# Patient Record
Sex: Female | Born: 1975
Health system: Southern US, Community
[De-identification: ages and names within clinical notes are randomized; demographics above are authoritative.]

## PROBLEM LIST (undated history)

## (undated) ENCOUNTER — Inpatient Hospital Stay (HOSPITAL_COMMUNITY): Payer: PRIVATE HEALTH INSURANCE

## (undated) ENCOUNTER — Emergency Department (HOSPITAL_COMMUNITY): Admission: EM | Discharge status: Against Medical Advice | Payer: Self-pay

## (undated) DIAGNOSIS — R87629 Unspecified abnormal cytological findings in specimens from vagina: Secondary | ICD-10-CM

## (undated) DIAGNOSIS — Z8619 Personal history of other infectious and parasitic diseases: Secondary | ICD-10-CM

## (undated) DIAGNOSIS — I10 Essential (primary) hypertension: Secondary | ICD-10-CM

## (undated) HISTORY — DX: Personal history of other infectious and parasitic diseases: Z86.19

## (undated) HISTORY — PX: BREAST BIOPSY: SHX20

## (undated) HISTORY — PX: FINGER SURGERY: SHX640

## (undated) HISTORY — PX: NO PAST SURGERIES: SHX2092

---

## 2006-01-26 ENCOUNTER — Emergency Department (HOSPITAL_COMMUNITY): Admission: EM | Admit: 2006-01-26 | Discharge: 2006-01-26 | Payer: Self-pay | Admitting: Emergency Medicine

## 2006-02-25 ENCOUNTER — Emergency Department (HOSPITAL_COMMUNITY): Admission: EM | Admit: 2006-02-25 | Discharge: 2006-02-26 | Payer: Self-pay | Admitting: Emergency Medicine

## 2006-12-29 ENCOUNTER — Emergency Department (HOSPITAL_COMMUNITY): Admission: EM | Admit: 2006-12-29 | Discharge: 2006-12-29 | Payer: Self-pay | Admitting: Emergency Medicine

## 2007-02-24 ENCOUNTER — Emergency Department (HOSPITAL_COMMUNITY): Admission: EM | Admit: 2007-02-24 | Discharge: 2007-02-24 | Payer: Self-pay | Admitting: Emergency Medicine

## 2007-04-26 ENCOUNTER — Inpatient Hospital Stay (HOSPITAL_COMMUNITY): Admission: AD | Admit: 2007-04-26 | Discharge: 2007-04-27 | Payer: Self-pay | Admitting: Obstetrics & Gynecology

## 2007-08-26 ENCOUNTER — Emergency Department (HOSPITAL_COMMUNITY): Admission: EM | Admit: 2007-08-26 | Discharge: 2007-08-26 | Payer: Self-pay | Admitting: Emergency Medicine

## 2007-11-11 ENCOUNTER — Emergency Department (HOSPITAL_COMMUNITY): Admission: EM | Admit: 2007-11-11 | Discharge: 2007-11-11 | Payer: Self-pay | Admitting: Emergency Medicine

## 2008-02-17 ENCOUNTER — Emergency Department (HOSPITAL_COMMUNITY): Admission: EM | Admit: 2008-02-17 | Discharge: 2008-02-17 | Payer: Self-pay | Admitting: Emergency Medicine

## 2008-03-10 ENCOUNTER — Emergency Department (HOSPITAL_COMMUNITY): Admission: EM | Admit: 2008-03-10 | Discharge: 2008-03-10 | Payer: Self-pay | Admitting: Emergency Medicine

## 2008-04-27 ENCOUNTER — Emergency Department (HOSPITAL_COMMUNITY): Admission: EM | Admit: 2008-04-27 | Discharge: 2008-04-28 | Payer: Self-pay | Admitting: Emergency Medicine

## 2008-08-28 ENCOUNTER — Emergency Department (HOSPITAL_COMMUNITY): Admission: EM | Admit: 2008-08-28 | Discharge: 2008-08-28 | Payer: Self-pay | Admitting: Emergency Medicine

## 2008-08-30 ENCOUNTER — Emergency Department (HOSPITAL_COMMUNITY): Admission: EM | Admit: 2008-08-30 | Discharge: 2008-08-30 | Payer: Self-pay | Admitting: Emergency Medicine

## 2008-11-29 ENCOUNTER — Emergency Department (HOSPITAL_COMMUNITY): Admission: EM | Admit: 2008-11-29 | Discharge: 2008-11-29 | Payer: Self-pay | Admitting: Emergency Medicine

## 2008-12-03 ENCOUNTER — Emergency Department (HOSPITAL_COMMUNITY): Admission: EM | Admit: 2008-12-03 | Discharge: 2008-12-04 | Payer: Self-pay | Admitting: Emergency Medicine

## 2009-01-16 ENCOUNTER — Other Ambulatory Visit: Admission: RE | Admit: 2009-01-16 | Discharge: 2009-01-16 | Payer: Self-pay | Admitting: Obstetrics and Gynecology

## 2009-04-20 ENCOUNTER — Emergency Department (HOSPITAL_COMMUNITY): Admission: EM | Admit: 2009-04-20 | Discharge: 2009-04-20 | Payer: Self-pay | Admitting: Emergency Medicine

## 2009-06-27 ENCOUNTER — Emergency Department (HOSPITAL_COMMUNITY): Admission: EM | Admit: 2009-06-27 | Discharge: 2009-06-27 | Payer: Self-pay | Admitting: Emergency Medicine

## 2009-09-07 ENCOUNTER — Emergency Department (HOSPITAL_COMMUNITY): Admission: EM | Admit: 2009-09-07 | Discharge: 2009-09-08 | Payer: Self-pay | Admitting: Emergency Medicine

## 2010-03-21 LAB — URINALYSIS, ROUTINE W REFLEX MICROSCOPIC
Bilirubin Urine: NEGATIVE
Glucose, UA: NEGATIVE mg/dL
Hgb urine dipstick: NEGATIVE
Ketones, ur: NEGATIVE mg/dL
Nitrite: NEGATIVE
Protein, ur: NEGATIVE mg/dL
Specific Gravity, Urine: 1.025 (ref 1.005–1.030)
Urobilinogen, UA: 1 mg/dL (ref 0.0–1.0)
pH: 6 (ref 5.0–8.0)

## 2010-03-21 LAB — POCT I-STAT, CHEM 8
BUN: 9 mg/dL (ref 6–23)
Calcium, Ion: 1.17 mmol/L (ref 1.12–1.32)
Chloride: 105 mEq/L (ref 96–112)
Creatinine, Ser: 0.9 mg/dL (ref 0.4–1.2)
Glucose, Bld: 79 mg/dL (ref 70–99)
HCT: 38 % (ref 36.0–46.0)
Hemoglobin: 12.9 g/dL (ref 12.0–15.0)
Potassium: 3.9 mEq/L (ref 3.5–5.1)
Sodium: 140 mEq/L (ref 135–145)
TCO2: 27 mmol/L (ref 0–100)

## 2010-03-21 LAB — POCT PREGNANCY, URINE: Preg Test, Ur: NEGATIVE

## 2010-03-26 ENCOUNTER — Inpatient Hospital Stay (HOSPITAL_COMMUNITY): Payer: Self-pay

## 2010-03-26 ENCOUNTER — Inpatient Hospital Stay (HOSPITAL_COMMUNITY)
Admission: AD | Admit: 2010-03-26 | Discharge: 2010-03-26 | Disposition: A | Discharge status: Home / Self Care | Payer: Self-pay | Source: Ambulatory Visit | Attending: Obstetrics & Gynecology | Admitting: Obstetrics & Gynecology

## 2010-03-26 DIAGNOSIS — N760 Acute vaginitis: Secondary | ICD-10-CM

## 2010-03-26 DIAGNOSIS — N83209 Unspecified ovarian cyst, unspecified side: Secondary | ICD-10-CM | POA: Insufficient documentation

## 2010-03-26 DIAGNOSIS — R109 Unspecified abdominal pain: Secondary | ICD-10-CM

## 2010-03-26 DIAGNOSIS — B9689 Other specified bacterial agents as the cause of diseases classified elsewhere: Secondary | ICD-10-CM | POA: Insufficient documentation

## 2010-03-26 DIAGNOSIS — A499 Bacterial infection, unspecified: Secondary | ICD-10-CM

## 2010-03-26 LAB — WET PREP, GENITAL: Yeast Wet Prep HPF POC: NONE SEEN

## 2010-03-26 LAB — CBC
HCT: 34.8 % — ABNORMAL LOW (ref 36.0–46.0)
Hemoglobin: 11.2 g/dL — ABNORMAL LOW (ref 12.0–15.0)
MCH: 26 pg (ref 26.0–34.0)
RBC: 4.31 MIL/uL (ref 3.87–5.11)
RDW: 14.4 % (ref 11.5–15.5)
WBC: 9.3 10*3/uL (ref 4.0–10.5)

## 2010-03-26 LAB — DIFFERENTIAL
Lymphs Abs: 3.5 10*3/uL (ref 0.7–4.0)
Monocytes Relative: 7 % (ref 3–12)
Neutrophils Relative %: 55 % (ref 43–77)

## 2010-03-26 LAB — STREP A DNA PROBE: Group A Strep Probe: NEGATIVE

## 2010-03-26 LAB — URINALYSIS, ROUTINE W REFLEX MICROSCOPIC
Hgb urine dipstick: NEGATIVE
Ketones, ur: NEGATIVE mg/dL
Protein, ur: NEGATIVE mg/dL
Specific Gravity, Urine: 1.025 (ref 1.005–1.030)
Urobilinogen, UA: 0.2 mg/dL (ref 0.0–1.0)
pH: 6 (ref 5.0–8.0)

## 2010-03-26 LAB — RAPID STREP SCREEN (MED CTR MEBANE ONLY): Streptococcus, Group A Screen (Direct): NEGATIVE

## 2010-03-27 LAB — GC/CHLAMYDIA PROBE AMP, GENITAL: GC Probe Amp, Genital: NEGATIVE

## 2010-06-09 ENCOUNTER — Emergency Department (HOSPITAL_COMMUNITY)
Admission: EM | Admit: 2010-06-09 | Discharge: 2010-06-10 | Disposition: A | Discharge status: Other Acute Inpatient Hospital | Payer: Self-pay | Attending: Emergency Medicine | Admitting: Emergency Medicine

## 2010-06-09 DIAGNOSIS — IMO0002 Reserved for concepts with insufficient information to code with codable children: Secondary | ICD-10-CM | POA: Insufficient documentation

## 2010-06-09 DIAGNOSIS — F3289 Other specified depressive episodes: Secondary | ICD-10-CM | POA: Insufficient documentation

## 2010-06-09 DIAGNOSIS — X789XXA Intentional self-harm by unspecified sharp object, initial encounter: Secondary | ICD-10-CM | POA: Insufficient documentation

## 2010-06-09 DIAGNOSIS — F1010 Alcohol abuse, uncomplicated: Secondary | ICD-10-CM | POA: Insufficient documentation

## 2010-06-09 DIAGNOSIS — F329 Major depressive disorder, single episode, unspecified: Secondary | ICD-10-CM | POA: Insufficient documentation

## 2010-06-09 LAB — DIFFERENTIAL
Basophils Relative: 0 % (ref 0–1)
Eosinophils Relative: 0 % (ref 0–5)
Lymphocytes Relative: 40 % (ref 12–46)
Monocytes Absolute: 0.3 10*3/uL (ref 0.1–1.0)

## 2010-06-09 LAB — HEPATIC FUNCTION PANEL
ALT: 14 U/L (ref 0–35)
AST: 20 U/L (ref 0–37)
Albumin: 3.7 g/dL (ref 3.5–5.2)
Alkaline Phosphatase: 76 U/L (ref 39–117)
Bilirubin, Direct: <0.1 mg/dL (ref 0.0–0.3)
Total Bilirubin: 0.1 mg/dL — ABNORMAL LOW (ref 0.3–1.2)
Total Protein: 7.9 g/dL (ref 6.0–8.3)

## 2010-06-09 LAB — ETHANOL: Alcohol, Ethyl (B): 178 mg/dL — ABNORMAL HIGH (ref 0–10)

## 2010-06-09 LAB — RAPID URINE DRUG SCREEN, HOSP PERFORMED
Cocaine: NOT DETECTED
Opiates: NOT DETECTED
Tetrahydrocannabinol: NOT DETECTED

## 2010-06-09 LAB — PREGNANCY, URINE: Preg Test, Ur: NEGATIVE

## 2010-06-09 LAB — BASIC METABOLIC PANEL
CO2: 24 mEq/L (ref 19–32)
Calcium: 8.4 mg/dL (ref 8.4–10.5)
Creatinine, Ser: 1.11 mg/dL (ref 0.4–1.2)
GFR calc Af Amer: >60 mL/min (ref >60)
GFR calc non Af Amer: 56 mL/min — ABNORMAL LOW (ref >60)
Sodium: 141 mEq/L (ref 135–145)

## 2010-06-09 LAB — CBC
HCT: 36 % (ref 36.0–46.0)
MCV: 78.1 fL (ref 78.0–100.0)
RBC: 4.61 MIL/uL (ref 3.87–5.11)

## 2010-06-10 ENCOUNTER — Inpatient Hospital Stay (HOSPITAL_COMMUNITY)
Admission: AD | Admit: 2010-06-10 | Discharge: 2010-06-11 | DRG: 885 | Disposition: A | Discharge status: Home / Self Care | Payer: PRIVATE HEALTH INSURANCE | Source: Ambulatory Visit | Attending: Psychiatry | Admitting: Psychiatry

## 2010-06-10 DIAGNOSIS — F39 Unspecified mood [affective] disorder: Principal | ICD-10-CM

## 2010-06-10 DIAGNOSIS — J45909 Unspecified asthma, uncomplicated: Secondary | ICD-10-CM

## 2010-06-10 DIAGNOSIS — R45851 Suicidal ideations: Secondary | ICD-10-CM

## 2010-06-10 DIAGNOSIS — F1910 Other psychoactive substance abuse, uncomplicated: Secondary | ICD-10-CM

## 2010-06-10 DIAGNOSIS — I10 Essential (primary) hypertension: Secondary | ICD-10-CM

## 2010-06-11 DIAGNOSIS — F39 Unspecified mood [affective] disorder: Secondary | ICD-10-CM

## 2010-07-18 NOTE — Assessment & Plan Note (Signed)
  Sherri Reid, Sherri Reid             ACCOUNT NO.:  [id number]  MEDICAL RECORD NO.:  [id number]  LOCATION:  0303                          FACILITY:  [facility]  PHYSICIAN:  Eulogio Ditch, MD DATE OF BIRTH:  Dec 31, 1975  DATE OF ADMISSION:  06/10/2010 DATE OF DISCHARGE:  06/11/2010                      PSYCHIATRIC ADMISSION ASSESSMENT   IDENTIFYING INFORMATION:  This is a 35 year old female.  This is an involuntary admission.  HISTORY OF PRESENT ILLNESS:  Sherri Reid was presented in our emergency room and placed under involuntary commitment after attempting to harm herself by cutting her wrist and neck.  She had also taken some ibuprofen, Percocet and Flexeril in amounts that are unclear.  MEDICAL EVALUATION:  She was medically evaluated in the emergency room and found to be stable and transferred to [facility] for further treatment.  COURSE OF HOSPITALIZATION:  She was admitted to the adult [facility] unit and no medications were initially started.  She gave permission for [place] to speak with her mother, who said that she had probably gotten anxious and did not understand why the patient had not phoned her.  She was evaluated by Dr. Eulogio Ditch, who found her to be stable and full contact with reality with no further concerns and she denied suicidal thoughts.  It was felt that her suicidal risk was minimal.  She was having no acute depressive symptoms and a family had no concerns about her safety.  WORKING ADMITTING DIAGNOSIS:  AXIS I:  Mood disorder NOS. AXIS II:  Deferred. AXIS III:  No diagnosis. AXIS IV:  Deferred. AXIS V:  Current 58, past year not known.  PLAN:  The plan is to discharge her today.  Please see the discharge summary.     Margaret A. Lorin Picket, N.P.   ______________________________ Eulogio Ditch, MD    MAS/MEDQ  D:  07/17/2010  T:  07/17/2010  Job:  [id number]  Electronically Signed by Kari Baars N.P. on 07/17/2010 11:41:24 AM Electronically Signed by  Eulogio Ditch  on 07/18/2010 03:31:23 PM

## 2010-07-18 NOTE — Discharge Summary (Signed)
  NAMESHAWNIKA, PEPIN             ACCOUNT NO.:  [id number]  MEDICAL RECORD NO.:  [id number]  LOCATION:  0303                          FACILITY:  [facility]  PHYSICIAN:  Eulogio Ditch, MD DATE OF BIRTH:  [birth year]  DATE OF ADMISSION:  06/10/2010 DATE OF DISCHARGE:  06/11/2010                              DISCHARGE SUMMARY   IDENTIFYING INFORMATION:  A 35 year old female.  This was an involuntary admission.  HISTORY OF PRESENT ILLNESS:  This is a brief admission for [facility] who had been placed under an involuntary petition in our emergency room after making some cuts to her arms and neck.  There was some concern about her safety.  On our unit, she was found to be in full contact with reality with no dangerous thoughts.  She clearly and convincingly denied any suicidal ideas.  Her family was contacted and had no concerns about her coming home and did not understand why she had not called them at the time if she had gotten anxious.  She agreed to outpatient followup.  DISCHARGE DIAGNOSIS:  Mood disorder, not otherwise specified.  DISCHARGE CONDITION:  Stable.  DISCHARGE MEDICATIONS:  None.  DISCHARGE INSTRUCTIONS:  Followup with [facility] on June 13, 2010 at 9:00 a.m.     Margaret A. Lorin Picket, N.P.   ______________________________ Eulogio Ditch, MD    MAS/MEDQ  D:  07/17/2010  T:  07/17/2010  Job:  [id number]  Electronically Signed by Kari Baars N.P. on 07/17/2010 11:41:29 AM Electronically Signed by Eulogio Ditch  on 07/18/2010 03:31:26 PM

## 2010-07-24 ENCOUNTER — Emergency Department (HOSPITAL_COMMUNITY)
Admission: EM | Admit: 2010-07-24 | Discharge: 2010-07-24 | Disposition: A | Discharge status: Home / Self Care | Payer: Self-pay | Attending: Emergency Medicine | Admitting: Emergency Medicine

## 2010-07-24 DIAGNOSIS — J45909 Unspecified asthma, uncomplicated: Secondary | ICD-10-CM | POA: Insufficient documentation

## 2010-07-24 DIAGNOSIS — I10 Essential (primary) hypertension: Secondary | ICD-10-CM | POA: Insufficient documentation

## 2010-07-24 DIAGNOSIS — T7840XA Allergy, unspecified, initial encounter: Secondary | ICD-10-CM | POA: Insufficient documentation

## 2010-07-24 DIAGNOSIS — L298 Other pruritus: Secondary | ICD-10-CM | POA: Insufficient documentation

## 2010-07-24 DIAGNOSIS — Z79899 Other long term (current) drug therapy: Secondary | ICD-10-CM | POA: Insufficient documentation

## 2010-07-24 DIAGNOSIS — L2989 Other pruritus: Secondary | ICD-10-CM | POA: Insufficient documentation

## 2010-07-24 DIAGNOSIS — L509 Urticaria, unspecified: Secondary | ICD-10-CM | POA: Insufficient documentation

## 2010-07-24 DIAGNOSIS — R21 Rash and other nonspecific skin eruption: Secondary | ICD-10-CM | POA: Insufficient documentation

## 2010-10-01 LAB — POCT PREGNANCY, URINE: Preg Test, Ur: NEGATIVE

## 2010-10-01 LAB — GC/CHLAMYDIA PROBE AMP, GENITAL
Chlamydia, DNA Probe: NEGATIVE
GC Probe Amp, Genital: NEGATIVE

## 2010-10-01 LAB — URINALYSIS, ROUTINE W REFLEX MICROSCOPIC
Glucose, UA: NEGATIVE
Ketones, ur: NEGATIVE
Nitrite: NEGATIVE
Protein, ur: NEGATIVE
pH: 6.5

## 2010-10-01 LAB — WET PREP, GENITAL: Clue Cells Wet Prep HPF POC: NONE SEEN

## 2010-10-11 LAB — RPR TITER: RPR Titer: 1:2 {titer} — AB

## 2010-10-11 LAB — URINALYSIS, ROUTINE W REFLEX MICROSCOPIC
Protein, ur: NEGATIVE
Specific Gravity, Urine: 1.023
Urobilinogen, UA: 0.2

## 2010-10-11 LAB — WET PREP, GENITAL
Trich, Wet Prep: NONE SEEN
Yeast Wet Prep HPF POC: NONE SEEN

## 2010-10-11 LAB — T.PALLIDUM AB, IGG: T pallidum Antibodies (TP-PA): >8 — ABNORMAL HIGH

## 2011-02-20 ENCOUNTER — Inpatient Hospital Stay (HOSPITAL_COMMUNITY)
Admission: AD | Admit: 2011-02-20 | Discharge: 2011-02-20 | Disposition: A | Discharge status: Home / Self Care | Payer: Self-pay | Source: Ambulatory Visit | Attending: Obstetrics & Gynecology | Admitting: Obstetrics & Gynecology

## 2011-02-20 ENCOUNTER — Encounter (HOSPITAL_COMMUNITY): Payer: Self-pay

## 2011-02-20 DIAGNOSIS — R109 Unspecified abdominal pain: Secondary | ICD-10-CM | POA: Insufficient documentation

## 2011-02-20 DIAGNOSIS — N940 Mittelschmerz: Secondary | ICD-10-CM | POA: Insufficient documentation

## 2011-02-20 LAB — URINALYSIS, ROUTINE W REFLEX MICROSCOPIC
Leukocytes, UA: NEGATIVE
Nitrite: NEGATIVE
Specific Gravity, Urine: 1.025 (ref 1.005–1.030)
Urobilinogen, UA: 0.2 mg/dL (ref 0.0–1.0)
pH: 6 (ref 5.0–8.0)

## 2011-02-20 LAB — POCT PREGNANCY, URINE: Preg Test, Ur: NEGATIVE

## 2011-02-20 LAB — WET PREP, GENITAL: Yeast Wet Prep HPF POC: NONE SEEN

## 2011-02-20 NOTE — Discharge Instructions (Signed)

## 2011-02-20 NOTE — ED Provider Notes (Signed)
History   Sherri Reid is a 36 y.o. year old [id number] female who presents to [facility] reporting nausea twice a week x 2 weeks and bilat low abd pain starting 3 days ago that was sharp, worse with movement and severe initially, but has improved spontaneously since that time. Patient's last menstrual period was 02/09/2011. Declines medication for pain or nausea.    CSN: [id number]  Arrival date & time 02/20/11  1030   None     Chief Complaint  Patient presents with  . Emesis    (Consider location/radiation/quality/duration/timing/severity/associated sxs/prior treatment) HPI  Past Medical History  Diagnosis Date  . Asthma   . Syphilis     treated    Past Surgical History  Procedure Date  . No past surgeries     Family History  Problem Relation Age of Onset  . Anesthesia problems Neg Hx     History  Substance Use Topics  . Smoking status: Current Everyday Smoker    Types: Cigarettes  . Smokeless tobacco: Not on file  . Alcohol Use:     OB History    Grav Para Term Preterm Abortions TAB SAB Ect Mult Living   1 0 0 0 1 0 1 0 0 0       Review of Systems  Constitutional: Negative for fever, chills, appetite change and unexpected weight change.  Gastrointestinal: Positive for nausea and abdominal pain. Negative for vomiting, diarrhea, constipation and abdominal distention.  Genitourinary: Negative for dysuria, urgency, frequency, hematuria, flank pain, vaginal bleeding, vaginal discharge, menstrual problem and pelvic pain.    Allergies  Penicillins  Home Medications  No current outpatient prescriptions on file.  BP 140/72  Pulse 71  Temp(Src) 98.1 F (36.7 C) (Oral)  Resp 20  Ht 5\' 1"  (1.549 m)  Wt 116.121 kg (256 lb)  BMI 48.37 kg/m2  LMP 02/09/2011  Physical Exam  Constitutional: She is oriented to person, place, and time. She appears well-developed and well-nourished. No distress.  Cardiovascular: Normal rate.   Pulmonary/Chest: Effort normal.    Abdominal: Soft. She exhibits no distension and no mass. There is no tenderness. There is no rebound and no guarding.       Exam limited by body habitus.  Genitourinary: Vagina normal and uterus normal. There is no lesion on the right labia. There is no lesion on the left labia. Uterus is not enlarged and not tender. Cervix exhibits no motion tenderness, no discharge and no friability. Right adnexum displays no mass, no tenderness and no fullness. Left adnexum displays no mass, no tenderness and no fullness. No bleeding around the vagina. No vaginal discharge found.  Musculoskeletal: Normal range of motion.  Neurological: She is alert and oriented to person, place, and time.  Skin: Skin is warm and dry.  Psychiatric: She has a normal mood and affect.    ED Course  Procedures (including critical care time)  Recent Results (from the past 168 hour(s))  URINALYSIS, ROUTINE W REFLEX MICROSCOPIC   Collection Time   02/20/11 11:03 AM      Component Value Range   Color, Urine YELLOW  YELLOW    APPearance CLEAR  CLEAR    Specific Gravity, Urine 1.025  1.005 - 1.030    pH 6.0  5.0 - 8.0    Glucose, UA NEGATIVE  NEGATIVE (mg/dL)   Hgb urine dipstick NEGATIVE  NEGATIVE    Bilirubin Urine NEGATIVE  NEGATIVE    Ketones, ur NEGATIVE  NEGATIVE (mg/dL)   Protein, ur NEGATIVE  NEGATIVE (mg/dL)   Urobilinogen, UA 0.2  0.0 - 1.0 (mg/dL)   Nitrite NEGATIVE  NEGATIVE    Leukocytes, UA NEGATIVE  NEGATIVE   POCT PREGNANCY, URINE   Collection Time   02/20/11 11:06 AM      Component Value Range   Preg Test, Ur NEGATIVE  NEGATIVE   WET PREP, GENITAL   Collection Time   02/20/11 11:27 AM      Component Value Range   Yeast Wet Prep HPF POC NONE SEEN  NONE SEEN    Trich, Wet Prep NONE SEEN  NONE SEEN    Clue Cells Wet Prep HPF POC FEW (*) NONE SEEN    WBC, Wet Prep HPF POC FEW (*) NONE SEEN     1. Abdominal pain, possible mittelschmerz    MDM  Discussed possible etiologies for low abdominal pain  including mittelschmerz, constipation, ovarian cysts, UTI, pelvic infection. Low suspicion for serious medical condition considering spontaneous improvement of pain. Recommend followup with primary care physician regarding nausea. Gonorrhea and Chlamydia pending Discharge home Over-the-counter Tylenol or ibuprofen as needed for pain. Call GYN clinic to schedule appointment and pelvic ultrasound is needed if abdominal pain returns   [place], Koleen Nimrod 02/20/2011

## 2011-02-20 NOTE — Progress Notes (Signed)
Patient states that she has had nausea and vomiting about twice a week for the past 2 weeks. Had back pain on 2-11 but none today. Has had some abdominal pain also on 2-11 but has not had any pain since. Had a negative pregnancy test 3 days ago. No bleeding or unusual discharge.

## 2011-02-21 LAB — GC/CHLAMYDIA PROBE AMP, GENITAL: GC Probe Amp, Genital: NEGATIVE

## 2011-06-06 ENCOUNTER — Encounter (HOSPITAL_COMMUNITY): Payer: Self-pay

## 2011-06-06 ENCOUNTER — Emergency Department (INDEPENDENT_AMBULATORY_CARE_PROVIDER_SITE_OTHER)
Admission: EM | Admit: 2011-06-06 | Discharge: 2011-06-06 | Disposition: A | Discharge status: Home Health | Payer: Self-pay | Source: Home / Self Care | Attending: Emergency Medicine | Admitting: Emergency Medicine

## 2011-06-06 ENCOUNTER — Emergency Department (INDEPENDENT_AMBULATORY_CARE_PROVIDER_SITE_OTHER): Payer: Self-pay

## 2011-06-06 DIAGNOSIS — S93609A Unspecified sprain of unspecified foot, initial encounter: Secondary | ICD-10-CM

## 2011-06-06 DIAGNOSIS — S93602A Unspecified sprain of left foot, initial encounter: Secondary | ICD-10-CM

## 2011-06-06 MED ORDER — MELOXICAM 15 MG PO TABS: 15.0000 mg | ORAL_TABLET | Freq: Every day | ORAL | Status: DC

## 2011-06-06 MED ORDER — HYDROCODONE-ACETAMINOPHEN 5-325 MG PO TABS: 2.0000 | ORAL_TABLET | ORAL | Status: AC | PRN

## 2011-06-06 NOTE — ED Notes (Signed)
Pt c/o L foot pain for past 2 weeks.  Pt states she stepped from truck and began to have pain afterwards.  Pt states pain is on top of foot, denies ankle pain.

## 2011-06-06 NOTE — Discharge Instructions (Signed)
Take the medication as written. Take 1 gram of tylenol with the motrin up to 4 times a day as needed for pain and fever. This with the meloxicam is an effective combination for pain. Take the hydrocodone/norco only for severe pain. Do not take the tylenol and hydrocodone/norco as they both have tylenol in them and too much can hurt your liver. Do not exceed 4 grams of tylenol a day from all sources. Return if you get worse, have a  fever >100.4, or for any concerns.   Go to www.goodrx.com to look up your medications. This will give you a list of where you can find your prescriptions at the most affordable prices.   

## 2011-06-06 NOTE — ED Provider Notes (Signed)
History     CSN: [id number]  Arrival date & time 06/06/11  1535   First MD Initiated Contact with Patient 06/06/11 1636      Chief Complaint  Patient presents with  . Foot Pain    (Consider location/radiation/quality/duration/timing/severity/associated sxs/prior treatment) HPI Comments: Patient reports stepping out of a high truck 2 weeks ago, and now reports achy pain, tenderness, swelling on the dorsal aspect of her left foot. She states that she was wearing her same sneakers that she usually does. She is unsure if she rolled her foot outwards. States that she has a lot of pain with weightbearing. No alleviating factors. No nausea, vomiting, paresthesias, no deformity, bruising, discoloration. Denies any injury to the ankle.She has no history of injury to this foot.   ROS as noted in HPI. All other ROS negative.   Patient is a 36 y.o. female presenting with lower extremity pain. The history is provided by the patient. No language interpreter was used.  Foot Pain This is a new problem. The current episode started more than 1 week ago. The problem occurs constantly. The problem has not changed since onset.The symptoms are aggravated by walking. The symptoms are relieved by nothing. She has tried nothing for the symptoms. The treatment provided no relief.    Past Medical History  Diagnosis Date  . Asthma   . Syphilis     treated    Past Surgical History  Procedure Date  . No past surgeries     Family History  Problem Relation Age of Onset  . Anesthesia problems Neg Hx     History  Substance Use Topics  . Smoking status: Current Everyday Smoker    Types: Cigarettes  . Smokeless tobacco: Not on file  . Alcohol Use: No    OB History    Grav Para Term Preterm Abortions TAB SAB Ect Mult Living   1 0 0 0 1 0 1 0 0 0       Review of Systems  Allergies  Penicillins  Home Medications   Current Outpatient Rx  Name Route Sig Dispense Refill  . ACETAMINOPHEN 500 MG  PO TABS Oral Take 500 mg by mouth every 6 (six) hours as needed. headache    . HYDROCODONE-ACETAMINOPHEN 5-325 MG PO TABS Oral Take 2 tablets by mouth every 4 (four) hours as needed for pain. 20 tablet 0  . MELOXICAM 15 MG PO TABS Oral Take 1 tablet (15 mg total) by mouth daily. 14 tablet 0    BP 141/69  Pulse 82  Temp(Src) 98.5 F (36.9 C) (Oral)  Resp 20  SpO2 100%  LMP 05/24/2011  Physical Exam  Nursing note and vitals reviewed. Constitutional: She is oriented to person, place, and time. She appears well-developed and well-nourished. No distress.  HENT:  Head: Normocephalic and atraumatic.  Eyes: Conjunctivae and EOM are normal.  Neck: Normal range of motion.  Cardiovascular: Normal rate.   Pulmonary/Chest: Effort normal.  Abdominal: She exhibits no distension.  Musculoskeletal: Normal range of motion.       Left ankle: Normal. Achilles tendon normal.       Left midfoot tender, mild STS. Base of fifth metatarsal NT. No bruising. Skin intact. DP 2+. Refill less than 2 seconds. Sensation grossly intact. Patient able to move all toes actively. Distal fibula NT, Medial malleolus NT,  Deltoid ligament NT, Lateral ligaments NT, Achilles NT. Patient able to bear weight while in department.  Neurological: She is alert and oriented to person, place,  and time.  Skin: Skin is warm and dry.  Psychiatric: She has a normal mood and affect. Her behavior is normal. Judgment and thought content normal.    ED Course  Procedures (including critical care time)  Labs Reviewed - No data to display Dg Foot Complete Left  06/06/2011  *RADIOLOGY REPORT*  Clinical Data: Twisting injury 9 days ago.  Pain.  LEFT FOOT - COMPLETE 3+ VIEW  Comparison: None.  Findings: Imaged bones, joints and soft tissues appear normal.  IMPRESSION: Negative exam.  Original Report Authenticated By: Bernadene Bell. D'ALESSIO, M.D.     1. Foot sprain, left, initial encounter     MDM  X-ray reviewed by myself. No fracture.  Full report per radiologist. Discussed imaging with patient. Will place her in ASO and crutches. If this is not enough  support, we'll place her in a Ace wrap and stiff soled shoe instead. Starting her in NSAIDs, ice, elevation, Norco. She'll followup with Dr. Jerl Santos, orthopedist on call or the sports medicine clinic if no better after 10 days.  Per chart review, patient was evaluated for accidental overdose last year when she was having a severe headache. Tylenol, salicylate, opiate levels were negative. Think that patient is safe to send home with Norco.   Luiz Blare, MD 06/06/11 [phone]

## 2011-10-26 ENCOUNTER — Emergency Department (HOSPITAL_COMMUNITY)
Admission: EM | Admit: 2011-10-26 | Discharge: 2011-10-27 | Disposition: A | Discharge status: Home / Self Care | Payer: Self-pay | Attending: Emergency Medicine | Admitting: Emergency Medicine

## 2011-10-26 ENCOUNTER — Encounter (HOSPITAL_COMMUNITY): Payer: Self-pay | Admitting: Emergency Medicine

## 2011-10-26 DIAGNOSIS — L02419 Cutaneous abscess of limb, unspecified: Secondary | ICD-10-CM | POA: Insufficient documentation

## 2011-10-26 DIAGNOSIS — L03119 Cellulitis of unspecified part of limb: Secondary | ICD-10-CM | POA: Insufficient documentation

## 2011-10-26 DIAGNOSIS — F17200 Nicotine dependence, unspecified, uncomplicated: Secondary | ICD-10-CM | POA: Insufficient documentation

## 2011-10-26 NOTE — ED Notes (Signed)
Reports noticed abscess 3-4 days ago to L thigh; reports she popped it but is worried about infection; also reports pain to L thumb

## 2011-10-27 MED ORDER — DOXYCYCLINE HYCLATE 100 MG PO CAPS: 100.0000 mg | ORAL_CAPSULE | Freq: Two times a day (BID) | ORAL | Status: DC

## 2011-10-27 NOTE — ED Provider Notes (Signed)
History     CSN: [id number]  Arrival date & time 10/26/11  2141   First MD Initiated Contact with Patient 10/26/11 2316      Chief Complaint  Patient presents with  . Abscess   HPI  History provided by the patient. Patient is a 36 year old female who presents with complaints of left skin irritation, redness and drainage. Patient reports first having a small boil one week ago. This seemed to go away slightly but has returned over the last 3-4 days with increased redness and tenderness. She reports that this did pop and had bleeding and purulent drainage. Skin continues to be red and swollen. She denies any erythematous streaks up the leg. Denies any fever, chills or sweats. Patient also requests to be evaluated for tenderness over left thumb. Pain is primarily over the proximal dorsal left thumb area is slightly worse with some movements. She denies any injury and does not recall any stretching or other trauma. Patient does use the thumb often on her phone. This sometimes makes symptoms worse. There's been no swelling and numbness.    Past Medical History  Diagnosis Date  . Asthma   . Syphilis     treated    Past Surgical History  Procedure Date  . No past surgeries     Family History  Problem Relation Age of Onset  . Anesthesia problems Neg Hx     History  Substance Use Topics  . Smoking status: Current Every Day Smoker -- 0.5 packs/day    Types: Cigarettes  . Smokeless tobacco: Not on file  . Alcohol Use: No    OB History    Grav Para Term Preterm Abortions TAB SAB Ect Mult Living   1 0 0 0 1 0 1 0 0 0       Review of Systems  Constitutional: Negative for fever, chills and diaphoresis.  Cardiovascular: Negative for chest pain.  Musculoskeletal:       Left thumb pain  Skin:       Redness and swelling to left thigh  Neurological: Negative for weakness and numbness.    Allergies  Penicillins  Home Medications   Current Outpatient Rx  Name Route Sig  Dispense Refill  . IBUPROFEN 800 MG PO TABS Oral Take 800 mg by mouth every 8 (eight) hours as needed. For pain      BP 145/77  Pulse 100  Temp 98 F (36.7 C) (Oral)  Resp 18  SpO2 98%  LMP 10/19/2011  Physical Exam  Nursing note and vitals reviewed. Constitutional: She is oriented to person, place, and time. She appears well-developed and well-nourished. No distress.  HENT:  Head: Normocephalic.  Cardiovascular: Normal rate and regular rhythm.   Pulmonary/Chest: Effort normal and breath sounds normal.  Musculoskeletal:       Tenderness over dorsal proximal left thumb. There is no deformity or mass. Normal range of motion and strength and thumb. Normal distal sensations and cap refill.  Neurological: She is alert and oriented to person, place, and time.  Skin: Skin is warm and dry. No rash noted.       8 cm area of erythema to anterior thigh. There is a 4 cm central area of induration and nodularity. There is some evidence of old bleeding with scab. No active bleeding or drainage. Area is tender to palpation. No erythematous streaks.  Psychiatric: She has a normal mood and affect. Her behavior is normal.    ED Course  Procedures  INCISION AND  DRAINAGE Performed by: Angus Seller Consent: Verbal consent obtained. Risks and benefits: risks, benefits and alternatives were discussed Type: abscess  Body area: Left anterior thigh  Anesthesia: local infiltration  Local anesthetic: lidocaine 2% with epinephrine  Anesthetic total: 5 ml  Complexity: complex Blunt dissection to break up loculations  Drainage: purulent  Drainage amount: Small   Packing material: None   Patient tolerance: Patient tolerated the procedure well with no immediate complications.       1. Cellulitis and abscess of leg       MDM  Patient seen and evaluated. Patient in no acute distress. Patient with no significant history of left thumb injury. There is some tenderness over the extensor  and abductor tendons. No deformity or weakness. Suspect tendinitis to the area.  Left thigh I&D without large amounts of purulent drainage. There is significant induration and spread of erythema concerning for cellulitis. Will place patient on antibiotic and instructed for a long     Angus Seller, [place] 10/27/11 [phone]

## 2011-10-27 NOTE — ED Provider Notes (Signed)
Medical screening examination/treatment/procedure(s) were performed by non-physician practitioner and as supervising physician I was immediately available for consultation/collaboration.  Pearly Bartosik M Elliona Doddridge, MD 10/27/11 0551 

## 2012-02-19 ENCOUNTER — Encounter (HOSPITAL_COMMUNITY): Payer: Self-pay | Admitting: *Deleted

## 2012-02-19 DIAGNOSIS — J45909 Unspecified asthma, uncomplicated: Secondary | ICD-10-CM | POA: Insufficient documentation

## 2012-02-19 DIAGNOSIS — Z8619 Personal history of other infectious and parasitic diseases: Secondary | ICD-10-CM | POA: Insufficient documentation

## 2012-02-19 DIAGNOSIS — R55 Syncope and collapse: Secondary | ICD-10-CM | POA: Insufficient documentation

## 2012-02-19 DIAGNOSIS — F17200 Nicotine dependence, unspecified, uncomplicated: Secondary | ICD-10-CM | POA: Insufficient documentation

## 2012-02-19 DIAGNOSIS — F410 Panic disorder [episodic paroxysmal anxiety] without agoraphobia: Secondary | ICD-10-CM | POA: Insufficient documentation

## 2012-02-19 NOTE — ED Notes (Signed)
Per EMS: pt has had CP for 3 days. Pt states pain is sharp in nature and constant. Pt states that she has been under a lot of stress lately, pt was rapidly breathing when EMS arrived, like during a panic attack. Pt denies hx of CP or panic attacks.

## 2012-02-20 ENCOUNTER — Emergency Department (HOSPITAL_COMMUNITY): Payer: Self-pay

## 2012-02-20 ENCOUNTER — Emergency Department (HOSPITAL_COMMUNITY)
Admission: EM | Admit: 2012-02-20 | Discharge: 2012-02-20 | Disposition: A | Discharge status: Home / Self Care | Payer: Self-pay | Attending: Emergency Medicine | Admitting: Emergency Medicine

## 2012-02-20 DIAGNOSIS — F410 Panic disorder [episodic paroxysmal anxiety] without agoraphobia: Secondary | ICD-10-CM

## 2012-02-20 DIAGNOSIS — R0789 Other chest pain: Secondary | ICD-10-CM

## 2012-02-20 DIAGNOSIS — R55 Syncope and collapse: Secondary | ICD-10-CM

## 2012-02-20 LAB — POCT I-STAT, CHEM 8
BUN: 9 mg/dL (ref 6–23)
Calcium, Ion: 1.16 mmol/L (ref 1.12–1.23)
Chloride: 107 meq/L (ref 96–112)
Creatinine, Ser: 1.2 mg/dL — ABNORMAL HIGH (ref 0.50–1.10)
Glucose, Bld: 79 mg/dL (ref 70–99)
HCT: 40 % (ref 36.0–46.0)
Hemoglobin: 13.6 g/dL (ref 12.0–15.0)
Potassium: 3.9 meq/L (ref 3.5–5.1)
Sodium: 143 meq/L (ref 135–145)
TCO2: 27 mmol/L (ref 0–100)

## 2012-02-20 LAB — POCT I-STAT TROPONIN I: Troponin i, poc: 0.07 ng/mL (ref 0.00–0.08)

## 2012-02-20 MED ORDER — LORAZEPAM 1 MG PO TABS: 1.0000 mg | ORAL_TABLET | Freq: Three times a day (TID) | ORAL | Status: DC | PRN

## 2012-02-20 NOTE — ED Provider Notes (Signed)
History     CSN: [id number]  Arrival date & time 02/19/12  2354   First MD Initiated Contact with Patient 02/20/12 0022      Chief Complaint  Patient presents with  . Chest Pain  . Panic Attack    (Consider location/radiation/quality/duration/timing/severity/associated sxs/prior treatment) HPI Comments: Sherri Reid is a 37 y.o. female with a history of asthma and panic attacks presents emergency department with chief complaint of chest pain and syncope.  Onset of symptoms of chest pain began 3 days ago and are described as sharp constant sensation located in the mid sternum.  No known alleviating or exacerbating factors.  Pain is not exertional or positional.  Patient reports she's been under increased stress lately and had an episode today that started with heavy breathing and feeling like the world was closing and then patient blacked out.  Episode was witnessed and patient did not hit head, point of impact with right arm.  EMS was called by observers because they were under the impression that the patient was not breathing.  Patient states she is currently having the chest pain still but is no longer feeling anxious.  Patient denies any fevers, night sweats, chills, pleurisy, leg swelling, cough, hemoptysis, PND, orthopnea, headaches, vision changes.  Patient is a 37 y.o. female presenting with chest pain. The history is provided by the patient.  Chest Pain Associated symptoms: palpitations and shortness of breath   Associated symptoms: no abdominal pain, no back pain, no cough, no diaphoresis, no dizziness, no fatigue, no fever, no headache, no nausea and not vomiting     Past Medical History  Diagnosis Date  . Asthma   . Syphilis     treated    Past Surgical History  Procedure Laterality Date  . No past surgeries      Family History  Problem Relation Age of Onset  . Anesthesia problems Neg Hx     History  Substance Use Topics  . Smoking status: Current Every Day  Smoker -- 0.50 packs/day    Types: Cigarettes  . Smokeless tobacco: Not on file  . Alcohol Use: No    OB History   Grav Para Term Preterm Abortions TAB SAB Ect Mult Living   1 0 0 0 1 0 1 0 0 0       Review of Systems  Constitutional: Positive for activity change. Negative for fever, chills, diaphoresis, appetite change, fatigue and unexpected weight change.  HENT: Negative for hearing loss, ear pain, congestion, neck pain, neck stiffness and tinnitus.   Eyes: Negative for photophobia, pain and visual disturbance.  Respiratory: Positive for shortness of breath. Negative for apnea, cough, chest tightness, wheezing and stridor.   Cardiovascular: Positive for chest pain and palpitations. Negative for leg swelling.  Gastrointestinal: Negative for nausea, vomiting, abdominal pain, diarrhea and blood in stool.  Genitourinary: Negative for dysuria, urgency, hematuria, flank pain and difficulty urinating.  Musculoskeletal: Negative for myalgias, back pain and gait problem.  Skin: Negative for color change, pallor and rash.  Neurological: Positive for syncope. Negative for dizziness, seizures, facial asymmetry, speech difficulty, light-headedness and headaches.  Hematological: Does not bruise/bleed easily.  Psychiatric/Behavioral: Negative for behavioral problems, confusion, decreased concentration and agitation.  All other systems reviewed and are negative.    Allergies  Penicillins  Home Medications  No current outpatient prescriptions on file.  BP 164/94  Pulse 80  Temp(Src) 97.3 F (36.3 C) (Oral)  Resp 16  SpO2 100%  LMP 02/13/2012  Physical Exam  Nursing note and vitals reviewed. Constitutional: She is oriented to person, place, and time. She appears well-developed and well-nourished. No distress.  Patient not in visible distress  HENT:  Head: Normocephalic and atraumatic.  Eyes: Conjunctivae and EOM are normal. Pupils are equal, round, and reactive to light.  Neck:  Normal range of motion. Neck supple. Normal carotid pulses, no hepatojugular reflux and no JVD present. Carotid bruit is not present.  No carotid bruits  Cardiovascular:  RRR, no aberrancy on auscultation, intact distal pulses no pitting edema bilaterally.  Pulmonary/Chest: Effort normal and breath sounds normal. No respiratory distress.  Lungs clear to auscultation bilaterally  Abdominal: Soft. She exhibits no distension. There is no tenderness.  Nonpulsatile aorta  Musculoskeletal: Normal range of motion. She exhibits no tenderness.  Neurological: She is alert and oriented to person, place, and time. She displays a negative Romberg sign.  Cranial nerves III through XII intact, normal coordination, negative Romberg, strength 5/5 bilaterally. No ataxia  Skin: Skin is warm and dry. No pallor.  Psychiatric: She has a normal mood and affect. Her behavior is normal.    ED Course  Procedures (including critical care time)  Labs Reviewed  POCT I-STAT, CHEM 8 - Abnormal; Notable for the following:    Creatinine, Ser 1.20 (*)    All other components within normal limits  TROPONIN I  POCT I-STAT TROPONIN I   Dg Chest 2 View  02/20/2012  *RADIOLOGY REPORT*  Clinical Data: Syncope; chest pain.  Emesis.  History of asthma.  CHEST - 2 VIEW  Comparison: Chest radiograph performed 04/28/2008  Findings: The lungs are well-aerated and clear.  There is no evidence of focal opacification, pleural effusion or pneumothorax.  The heart is normal in size; the mediastinal contour is within normal limits.  No acute osseous abnormalities are seen.  IMPRESSION: No acute cardiopulmonary process seen.   Original Report Authenticated By: Tonia Ghent, M.D.      No diagnosis found.  BP 164/94  Pulse 80  Temp(Src) 97.3 F (36.3 C) (Oral)  Resp 16  SpO2 100%  LMP 02/13/2012   MDM  Anxiety attack induced likely vasovagal syncope  Patient presents to the emergency department complaining of symptoms  consistent with anxiety.  Patient has a history of same with similar episodes.  The patient is resting comfortably, in no apparent distress and asymptomatic.  Labs, ECG and vital signs reviewed.  No exophthalmos, no concern for pregnancy as pt is not sexually active with men. Stress reducing mechanisms discussed including caffeine intake.  Patient has been referred to psychiatric services for follow-up.  Discharged with a 3 day prescription for Ativan 1mg  for rescue medication. No focal neuro deficits or concern for closed head injury from syncopal episode.             Jaci Carrel, PA-C 02/20/12 0300

## 2012-02-20 NOTE — ED Provider Notes (Signed)
Medical screening examination/treatment/procedure(s) were performed by non-physician practitioner and as supervising physician I was immediately available for consultation/collaboration.   Gwyneth Sprout, MD 02/20/12 [phone]

## 2012-05-21 ENCOUNTER — Emergency Department (HOSPITAL_COMMUNITY)
Admission: EM | Admit: 2012-05-21 | Discharge: 2012-05-21 | Disposition: A | Discharge status: Home / Self Care | Payer: Self-pay | Attending: Emergency Medicine | Admitting: Emergency Medicine

## 2012-05-21 ENCOUNTER — Emergency Department (HOSPITAL_COMMUNITY): Payer: Self-pay

## 2012-05-21 ENCOUNTER — Encounter (HOSPITAL_COMMUNITY): Payer: Self-pay | Admitting: *Deleted

## 2012-05-21 DIAGNOSIS — J45909 Unspecified asthma, uncomplicated: Secondary | ICD-10-CM | POA: Insufficient documentation

## 2012-05-21 DIAGNOSIS — S46911A Strain of unspecified muscle, fascia and tendon at shoulder and upper arm level, right arm, initial encounter: Secondary | ICD-10-CM

## 2012-05-21 DIAGNOSIS — F17200 Nicotine dependence, unspecified, uncomplicated: Secondary | ICD-10-CM | POA: Insufficient documentation

## 2012-05-21 DIAGNOSIS — W19XXXA Unspecified fall, initial encounter: Secondary | ICD-10-CM | POA: Insufficient documentation

## 2012-05-21 DIAGNOSIS — Y939 Activity, unspecified: Secondary | ICD-10-CM | POA: Insufficient documentation

## 2012-05-21 DIAGNOSIS — Y929 Unspecified place or not applicable: Secondary | ICD-10-CM | POA: Insufficient documentation

## 2012-05-21 DIAGNOSIS — Z8619 Personal history of other infectious and parasitic diseases: Secondary | ICD-10-CM | POA: Insufficient documentation

## 2012-05-21 DIAGNOSIS — Z880 Allergy status to penicillin: Secondary | ICD-10-CM | POA: Insufficient documentation

## 2012-05-21 DIAGNOSIS — Z79899 Other long term (current) drug therapy: Secondary | ICD-10-CM | POA: Insufficient documentation

## 2012-05-21 DIAGNOSIS — S4350XA Sprain of unspecified acromioclavicular joint, initial encounter: Secondary | ICD-10-CM | POA: Insufficient documentation

## 2012-05-21 MED ORDER — NAPROXEN 500 MG PO TABS: 500.0000 mg | ORAL_TABLET | Freq: Two times a day (BID) | ORAL | Status: DC

## 2012-05-21 MED ORDER — HYDROCODONE-ACETAMINOPHEN 5-325 MG PO TABS
1.0000 | ORAL_TABLET | Freq: Once | ORAL | Status: AC
  Administered 2012-05-21: 1 via ORAL
  Filled 2012-05-21: qty 1

## 2012-05-21 MED ORDER — HYDROCODONE-ACETAMINOPHEN 5-325 MG PO TABS: 1.0000 | ORAL_TABLET | Freq: Four times a day (QID) | ORAL | Status: DC | PRN

## 2012-05-21 MED ORDER — IBUPROFEN 400 MG PO TABS
800.0000 mg | ORAL_TABLET | Freq: Once | ORAL | Status: AC
  Administered 2012-05-21: 800 mg via ORAL
  Filled 2012-05-21 (×2): qty 1

## 2012-05-21 NOTE — ED Provider Notes (Signed)
History     CSN: [id number]  Arrival date & time 05/21/12  0058   First MD Initiated Contact with Patient 05/21/12 0124      Chief Complaint  Patient presents with  . Shoulder Pain    (Consider location/radiation/quality/duration/timing/severity/associated sxs/prior treatment) Patient is a 37 y.o. female presenting with shoulder pain. The history is provided by the patient.  Shoulder Pain This is a new problem. Episode onset: April 20th. The problem occurs intermittently. The problem has been waxing and waning. Associated symptoms include joint swelling and myalgias. Pertinent negatives include no chills, coughing, diaphoresis, fatigue, fever, headaches, nausea, visual change, vomiting or weakness. Exacerbated by: certain movements of extremity, or heavy lifting. She has tried nothing for the symptoms. The treatment provided no relief.    Past Medical History  Diagnosis Date  . Asthma   . Syphilis     treated    Past Surgical History  Procedure Laterality Date  . No past surgeries      Family History  Problem Relation Age of Onset  . Anesthesia problems Neg Hx     History  Substance Use Topics  . Smoking status: Current Every Day Smoker -- 0.50 packs/day    Types: Cigarettes  . Smokeless tobacco: Not on file  . Alcohol Use: No    OB History   Grav Para Term Preterm Abortions TAB SAB Ect Mult Living   1 0 0 0 1 0 1 0 0 0       Review of Systems  Constitutional: Negative for fever, chills, diaphoresis and fatigue.  Respiratory: Negative for cough.   Gastrointestinal: Negative for nausea and vomiting.  Musculoskeletal: Positive for myalgias and joint swelling.  Neurological: Negative for weakness and headaches.    Allergies  Penicillins  Home Medications   Current Outpatient Rx  Name  Route  Sig  Dispense  Refill  . LORazepam (ATIVAN) 1 MG tablet   Oral   Take 1 tablet (1 mg total) by mouth 3 (three) times daily as needed for anxiety.   12 tablet    0     BP 153/91  Pulse 94  Temp(Src) 99.4 F (37.4 C) (Oral)  Resp 16  SpO2 100%  LMP 04/25/2012  Physical Exam  Nursing note and vitals reviewed. Constitutional: She appears well-developed and well-nourished. No distress.  HENT:  Head: Normocephalic and atraumatic.  Eyes: Conjunctivae and EOM are normal.  Neck: Normal range of motion. Neck supple.  Cardiovascular:  Intact distal pulses, capillary refill < 3 seconds  Musculoskeletal:  RUE: ttp just over [facility] joint. Pain w ROM. All other extremities with normal ROM  Neurological:  No sensory deficit  Skin: She is not diaphoretic.  Skin intact, no tenting    ED Course  Procedures (including critical care time)  Labs Reviewed - No data to display Dg Shoulder Right  05/21/2012   *RADIOLOGY REPORT*  Clinical Data: Right shoulder pain, recent trauma  RIGHT SHOULDER - 2+ VIEW  Comparison: 02/17/2008  Findings: Mild acromioclavicular joint widening without superior displacement of the clavicle is similar to prior.  Glenohumeral joint intact. No acute fracture. Right upper lung clear.  IMPRESSION: Mild AC joint widening is similar to prior and can be within normal limits or seen with a prior ligamentous injury/sprain.  No acute fracture or glenohumeral dislocation.   Original Report Authenticated By: Jearld Lesch, M.D.     No diagnosis found.    MDM  Shoulder sprain  Pt to ER s/p fall 1  mo prior w continued shoulder pain, sometimes worse then others. Neurovascularly intact on exam. Pain managed in ED. Advised ortho f-u if symptoms cont. Will dc w NSAIDs an short coarse of narcotic        Jaci Carrel, PA-C 05/21/12 0144

## 2012-05-21 NOTE — ED Notes (Signed)
Pt discharged.Vital signs stable and GCS 15 

## 2012-05-21 NOTE — ED Provider Notes (Signed)
Medical screening examination/treatment/procedure(s) were performed by non-physician practitioner and as supervising physician I was immediately available for consultation/collaboration.  Simren Popson M Rechel Delosreyes, MD 05/21/12 0258 
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
## 2012-09-17 ENCOUNTER — Encounter (HOSPITAL_COMMUNITY): Payer: Self-pay | Admitting: Emergency Medicine

## 2012-09-17 ENCOUNTER — Emergency Department (HOSPITAL_COMMUNITY)
Admission: EM | Admit: 2012-09-17 | Discharge: 2012-09-17 | Disposition: A | Discharge status: Home / Self Care | Payer: Self-pay | Attending: Emergency Medicine | Admitting: Emergency Medicine

## 2012-09-17 ENCOUNTER — Emergency Department (HOSPITAL_COMMUNITY): Payer: Self-pay

## 2012-09-17 DIAGNOSIS — X503XXA Overexertion from repetitive movements, initial encounter: Secondary | ICD-10-CM | POA: Insufficient documentation

## 2012-09-17 DIAGNOSIS — Y990 Civilian activity done for income or pay: Secondary | ICD-10-CM | POA: Insufficient documentation

## 2012-09-17 DIAGNOSIS — Z791 Long term (current) use of non-steroidal anti-inflammatories (NSAID): Secondary | ICD-10-CM | POA: Insufficient documentation

## 2012-09-17 DIAGNOSIS — Y9289 Other specified places as the place of occurrence of the external cause: Secondary | ICD-10-CM | POA: Insufficient documentation

## 2012-09-17 DIAGNOSIS — J45909 Unspecified asthma, uncomplicated: Secondary | ICD-10-CM | POA: Insufficient documentation

## 2012-09-17 DIAGNOSIS — F17200 Nicotine dependence, unspecified, uncomplicated: Secondary | ICD-10-CM | POA: Insufficient documentation

## 2012-09-17 DIAGNOSIS — M25511 Pain in right shoulder: Secondary | ICD-10-CM

## 2012-09-17 DIAGNOSIS — S46909A Unspecified injury of unspecified muscle, fascia and tendon at shoulder and upper arm level, unspecified arm, initial encounter: Secondary | ICD-10-CM | POA: Insufficient documentation

## 2012-09-17 DIAGNOSIS — Z880 Allergy status to penicillin: Secondary | ICD-10-CM | POA: Insufficient documentation

## 2012-09-17 DIAGNOSIS — S4980XA Other specified injuries of shoulder and upper arm, unspecified arm, initial encounter: Secondary | ICD-10-CM | POA: Insufficient documentation

## 2012-09-17 DIAGNOSIS — Y9389 Activity, other specified: Secondary | ICD-10-CM | POA: Insufficient documentation

## 2012-09-17 MED ORDER — NAPROXEN 500 MG PO TABS: 500.0000 mg | ORAL_TABLET | Freq: Two times a day (BID) | ORAL | Status: DC

## 2012-09-17 NOTE — ED Provider Notes (Signed)
CSN: [id number]     Arrival date & time 09/17/12  1307 History   First MD Initiated Contact with Patient 09/17/12 1322     Chief Complaint  Patient presents with  . Shoulder Pain   (Consider location/radiation/quality/duration/timing/severity/associated sxs/prior Treatment) HPI Comments: 37 year old female presents to the emergency department complaining of right shoulder pain over the past week. Patient states back in April of this year she had a right shoulder injury when she fell on her arm, has been fine since until one week ago when she began lifting heavy things at work. Since then the pain has been constant, worse with any heavy lifting or certain movements, unrelieved by ibuprofen. No new injury. States she is uncomfortable at night trying to sleep. Yesterday her arm became numb for short period of time. Denies any neck pain or trauma.  Patient is a 37 y.o. female presenting with shoulder pain. The history is provided by the patient.  Shoulder Pain Associated symptoms include numbness. Pertinent negatives include no chest pain or joint swelling.    Past Medical History  Diagnosis Date  . Asthma   . Syphilis     treated   Past Surgical History  Procedure Laterality Date  . No past surgeries     Family History  Problem Relation Age of Onset  . Anesthesia problems Neg Hx    History  Substance Use Topics  . Smoking status: Current Every Day Smoker -- 0.50 packs/day    Types: Cigarettes  . Smokeless tobacco: Not on file  . Alcohol Use: Yes     Comment: occasionally   OB History   Grav Para Term Preterm Abortions TAB SAB Ect Mult Living   1 0 0 0 1 0 1 0 0 0      Review of Systems  Respiratory: Negative for shortness of breath.   Cardiovascular: Negative for chest pain.  Musculoskeletal: Negative for joint swelling.       Positive for right shoulder pain.  Neurological: Positive for numbness.  All other systems reviewed and are negative.    Allergies   Penicillins  Home Medications   Current Outpatient Rx  Name  Route  Sig  Dispense  Refill  . ibuprofen (ADVIL,MOTRIN) 200 MG tablet   Oral   Take 600 mg by mouth every 6 (six) hours as needed for pain.          BP 149/89  Pulse 89  Temp(Src) 97.8 F (36.6 C) (Oral)  Resp 16  SpO2 100%  LMP 09/16/2012 Physical Exam  Nursing note and vitals reviewed. Constitutional: She is oriented to person, place, and time. She appears well-developed and well-nourished. No distress.  HENT:  Head: Normocephalic and atraumatic.  Mouth/Throat: Oropharynx is clear and moist.  Eyes: Conjunctivae are normal.  Neck: Normal range of motion. Neck supple.  Cardiovascular: Normal rate, regular rhythm and normal heart sounds.   Pulmonary/Chest: Effort normal and breath sounds normal.  Musculoskeletal: She exhibits no edema.  Decreased ROM with flexion limited to 120 degrees due to pain. Pain with extension. Tenderness to palpation of anterior aspect of shoulder girdle. No deformity. Strength UE 5/5 and equal bilateral. Positive impingement sign.  Neurological: She is alert and oriented to person, place, and time.  Sensation intact.  Skin: Skin is warm and dry. She is not diaphoretic.  Psychiatric: She has a normal mood and affect. Her behavior is normal.    ED Course  Procedures (including critical care time) Labs Review Labs Reviewed - No data  to display Imaging Review Dg Shoulder Right  09/17/2012   *RADIOLOGY REPORT*  Clinical Data: Pain  RIGHT SHOULDER - 2+ VIEW  Comparison: May 21, 2012  Findings: Frontal, Y scapular, and axillary images were obtained. There is no fracture or dislocation.  Joint spaces appear intact. No erosive change or intra-articular calcifications.  IMPRESSION: No abnormality noted.   Original Report Authenticated By: Bretta Bang, M.D.    MDM   1. Shoulder pain, right    Patient with right shoulder pain. Injury back in April, pain over the last week. Pain worse  with lifting. Pain with range of motion of flexion and extension. Positive impingement sign. Probable rotator cuff abnormality or rotator cuff tendinitis. Sling given. Advised naproxen. Conservative measures discussed. X-ray negative for any acute abnormality. Followup with PCP or or so in one to 2 weeks if no improvement. Return precautions discussed. Patient states understanding of plan and is agreeable.    Trevor Mace, PA-C 09/17/12 1502

## 2012-09-17 NOTE — ED Notes (Addendum)
Pt reports having R shoulder pain for 1.5 weeks; denies any recent injury but does report months ago she fell on R shoulder; describes pain as shooting and burning; CMS intact distal to injury; + radial pulse

## 2012-09-18 NOTE — ED Provider Notes (Signed)
Medical screening examination/treatment/procedure(s) were performed by non-physician practitioner and as supervising physician I was immediately available for consultation/collaboration.  Flint Melter, MD 09/18/12 1002

## 2012-10-16 ENCOUNTER — Emergency Department (HOSPITAL_COMMUNITY)
Admission: EM | Admit: 2012-10-16 | Discharge: 2012-10-16 | Disposition: A | Discharge status: Home / Self Care | Payer: Self-pay | Attending: Emergency Medicine | Admitting: Emergency Medicine

## 2012-10-16 ENCOUNTER — Emergency Department (HOSPITAL_COMMUNITY): Payer: Self-pay

## 2012-10-16 ENCOUNTER — Encounter (HOSPITAL_COMMUNITY): Payer: Self-pay | Admitting: Emergency Medicine

## 2012-10-16 DIAGNOSIS — S93409A Sprain of unspecified ligament of unspecified ankle, initial encounter: Secondary | ICD-10-CM | POA: Insufficient documentation

## 2012-10-16 DIAGNOSIS — J45909 Unspecified asthma, uncomplicated: Secondary | ICD-10-CM | POA: Insufficient documentation

## 2012-10-16 DIAGNOSIS — Y9389 Activity, other specified: Secondary | ICD-10-CM | POA: Insufficient documentation

## 2012-10-16 DIAGNOSIS — F17200 Nicotine dependence, unspecified, uncomplicated: Secondary | ICD-10-CM | POA: Insufficient documentation

## 2012-10-16 DIAGNOSIS — X500XXA Overexertion from strenuous movement or load, initial encounter: Secondary | ICD-10-CM | POA: Insufficient documentation

## 2012-10-16 DIAGNOSIS — Z880 Allergy status to penicillin: Secondary | ICD-10-CM | POA: Insufficient documentation

## 2012-10-16 DIAGNOSIS — Y929 Unspecified place or not applicable: Secondary | ICD-10-CM | POA: Insufficient documentation

## 2012-10-16 DIAGNOSIS — Z8619 Personal history of other infectious and parasitic diseases: Secondary | ICD-10-CM | POA: Insufficient documentation

## 2012-10-16 MED ORDER — IBUPROFEN 600 MG PO TABS: 600.0000 mg | ORAL_TABLET | Freq: Three times a day (TID) | ORAL | Status: DC | PRN

## 2012-10-16 MED ORDER — HYDROCODONE-ACETAMINOPHEN 5-325 MG PO TABS: 1.0000 | ORAL_TABLET | Freq: Three times a day (TID) | ORAL | Status: DC | PRN

## 2012-10-16 MED ORDER — HYDROCODONE-ACETAMINOPHEN 5-325 MG PO TABS
1.0000 | ORAL_TABLET | Freq: Once | ORAL | Status: AC
  Administered 2012-10-16: 1 via ORAL
  Filled 2012-10-16: qty 1

## 2012-10-16 MED ORDER — IBUPROFEN 400 MG PO TABS
600.0000 mg | ORAL_TABLET | Freq: Once | ORAL | Status: AC
  Administered 2012-10-16: 600 mg via ORAL
  Filled 2012-10-16 (×2): qty 1
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
## 2012-10-18 NOTE — ED Provider Notes (Signed)
Medical screening examination/treatment/procedure(s) were performed by non-physician practitioner and as supervising physician I was immediately available for consultation/collaboration.  Tahara Ruffini, MD 10/18/12 0019 

## 2013-01-28 ENCOUNTER — Encounter (HOSPITAL_COMMUNITY): Payer: Self-pay | Admitting: Emergency Medicine

## 2013-01-28 ENCOUNTER — Emergency Department (HOSPITAL_COMMUNITY)
Admission: EM | Admit: 2013-01-28 | Discharge: 2013-01-28 | Disposition: A | Discharge status: Home / Self Care | Payer: Self-pay | Attending: Emergency Medicine | Admitting: Emergency Medicine

## 2013-01-28 DIAGNOSIS — F17200 Nicotine dependence, unspecified, uncomplicated: Secondary | ICD-10-CM | POA: Insufficient documentation

## 2013-01-28 DIAGNOSIS — R112 Nausea with vomiting, unspecified: Secondary | ICD-10-CM | POA: Insufficient documentation

## 2013-01-28 DIAGNOSIS — J45909 Unspecified asthma, uncomplicated: Secondary | ICD-10-CM | POA: Insufficient documentation

## 2013-01-28 DIAGNOSIS — N760 Acute vaginitis: Secondary | ICD-10-CM | POA: Insufficient documentation

## 2013-01-28 DIAGNOSIS — Z8619 Personal history of other infectious and parasitic diseases: Secondary | ICD-10-CM | POA: Insufficient documentation

## 2013-01-28 DIAGNOSIS — R300 Dysuria: Secondary | ICD-10-CM | POA: Insufficient documentation

## 2013-01-28 DIAGNOSIS — A499 Bacterial infection, unspecified: Secondary | ICD-10-CM | POA: Insufficient documentation

## 2013-01-28 DIAGNOSIS — R102 Pelvic and perineal pain: Secondary | ICD-10-CM

## 2013-01-28 DIAGNOSIS — R109 Unspecified abdominal pain: Secondary | ICD-10-CM | POA: Insufficient documentation

## 2013-01-28 DIAGNOSIS — B9689 Other specified bacterial agents as the cause of diseases classified elsewhere: Secondary | ICD-10-CM | POA: Insufficient documentation

## 2013-01-28 DIAGNOSIS — R197 Diarrhea, unspecified: Secondary | ICD-10-CM | POA: Insufficient documentation

## 2013-01-28 DIAGNOSIS — Z3202 Encounter for pregnancy test, result negative: Secondary | ICD-10-CM | POA: Insufficient documentation

## 2013-01-28 DIAGNOSIS — Z880 Allergy status to penicillin: Secondary | ICD-10-CM | POA: Insufficient documentation

## 2013-01-28 LAB — COMPREHENSIVE METABOLIC PANEL
ALT: 8 U/L (ref 0–35)
AST: 14 U/L (ref 0–37)
Albumin: 3.3 g/dL — ABNORMAL LOW (ref 3.5–5.2)
Alkaline Phosphatase: 64 U/L (ref 39–117)
BUN: 18 mg/dL (ref 6–23)
CALCIUM: 8.7 mg/dL (ref 8.4–10.5)
CO2: 24 mEq/L (ref 19–32)
Chloride: 104 mEq/L (ref 96–112)
Creatinine, Ser: 0.94 mg/dL (ref 0.50–1.10)
GFR, EST AFRICAN AMERICAN: 89 mL/min — AB (ref >90)
GFR, EST NON AFRICAN AMERICAN: 77 mL/min — AB (ref >90)
GLUCOSE: 70 mg/dL (ref 70–99)
Potassium: 4.4 mEq/L (ref 3.7–5.3)
SODIUM: 140 meq/L (ref 137–147)
Total Bilirubin: <0.2 mg/dL — ABNORMAL LOW (ref 0.3–1.2)
Total Protein: 7.3 g/dL (ref 6.0–8.3)

## 2013-01-28 LAB — URINALYSIS, ROUTINE W REFLEX MICROSCOPIC
Bilirubin Urine: NEGATIVE
Glucose, UA: NEGATIVE mg/dL
HGB URINE DIPSTICK: NEGATIVE
Ketones, ur: NEGATIVE mg/dL
Leukocytes, UA: NEGATIVE
NITRITE: NEGATIVE
PROTEIN: NEGATIVE mg/dL
SPECIFIC GRAVITY, URINE: 1.019 (ref 1.005–1.030)
UROBILINOGEN UA: 0.2 mg/dL (ref 0.0–1.0)
pH: 6 (ref 5.0–8.0)

## 2013-01-28 LAB — CBC WITH DIFFERENTIAL/PLATELET
Basophils Absolute: 0 10*3/uL (ref 0.0–0.1)
Basophils Relative: 0 % (ref 0–1)
EOS PCT: 1 % (ref 0–5)
Eosinophils Absolute: 0.1 10*3/uL (ref 0.0–0.7)
HCT: 34.1 % — ABNORMAL LOW (ref 36.0–46.0)
Hemoglobin: 11.3 g/dL — ABNORMAL LOW (ref 12.0–15.0)
LYMPHS ABS: 3 10*3/uL (ref 0.7–4.0)
Lymphocytes Relative: 30 % (ref 12–46)
MCH: 27.1 pg (ref 26.0–34.0)
MCHC: 33.1 g/dL (ref 30.0–36.0)
MCV: 81.8 fL (ref 78.0–100.0)
Monocytes Absolute: 0.7 10*3/uL (ref 0.1–1.0)
Monocytes Relative: 7 % (ref 3–12)
Neutro Abs: 6 10*3/uL (ref 1.7–7.7)
Neutrophils Relative %: 62 % (ref 43–77)
PLATELETS: 307 10*3/uL (ref 150–400)
RBC: 4.17 MIL/uL (ref 3.87–5.11)
RDW: 14.2 % (ref 11.5–15.5)
WBC: 9.8 10*3/uL (ref 4.0–10.5)

## 2013-01-28 LAB — POCT PREGNANCY, URINE: Preg Test, Ur: NEGATIVE

## 2013-01-28 LAB — WET PREP, GENITAL
TRICH WET PREP: NONE SEEN
Yeast Wet Prep HPF POC: NONE SEEN

## 2013-01-28 LAB — LIPASE, BLOOD: LIPASE: 30 U/L (ref 11–59)

## 2013-01-28 MED ORDER — METRONIDAZOLE 500 MG PO TABS: 500.0000 mg | ORAL_TABLET | Freq: Two times a day (BID) | ORAL | Status: DC

## 2013-01-28 NOTE — ED Notes (Addendum)
PT c/o suprapubic pain x 1 week that increases when she urinates. Also c/o vaginal burning.  Pt also c/o emesis yesterday and diarrhea yesterday.

## 2013-01-28 NOTE — ED Provider Notes (Signed)
CSN: [id number]     Arrival date & time 01/28/13  0759 History   First MD Initiated Contact with Patient 01/28/13 0802     Chief Complaint  Patient presents with  . Abdominal Pain   (Consider location/radiation/quality/duration/timing/severity/associated sxs/prior Treatment) HPI Comments: Patient is a 38 year old female with history of syphilis who presents today with suprapubic abdominal pain. The pain is constant and achy, but worse with urination. She has been taking over-the-counter urinary tract infection medication as well as drinking a lot of water and cranberry juice. This has not resolved her symptoms. Her symptoms have been gradually worsening over the past week. She had one episode of emesis yesterday. She had watery diarrhea today. She denies any abnormal vaginal discharge. She does note that she has been having dyspareunia, but has not had intercourse recently. Her last menstrual period was 01/07/2013. She denies fever, chills, chest pain, shortness of breath. She does not remember if these are similar symptoms to when she was diagnosed with syphilis.   The history is provided by the patient. No language interpreter was used.    Past Medical History  Diagnosis Date  . Asthma   . Syphilis     treated   Past Surgical History  Procedure Laterality Date  . No past surgeries     Family History  Problem Relation Age of Onset  . Anesthesia problems Neg Hx    History  Substance Use Topics  . Smoking status: Current Every Day Smoker -- 0.50 packs/day    Types: Cigarettes  . Smokeless tobacco: Not on file  . Alcohol Use: Yes     Comment: occasionally   OB History   Grav Para Term Preterm Abortions TAB SAB Ect Mult Living   1 0 0 0 1 0 1 0 0 0      Review of Systems  Constitutional: Negative for fever and chills.  Respiratory: Negative for shortness of breath.   Cardiovascular: Negative for chest pain.  Gastrointestinal: Positive for nausea, vomiting and diarrhea.   Genitourinary: Positive for dysuria, vaginal pain, pelvic pain and dyspareunia. Negative for vaginal bleeding and vaginal discharge.  All other systems reviewed and are negative.    Allergies  Penicillins  Home Medications   Current Outpatient Rx  Name  Route  Sig  Dispense  Refill  . HYDROcodone-acetaminophen (NORCO/VICODIN) 5-325 MG per tablet   Oral   Take 1 tablet by mouth every 8 (eight) hours as needed for pain.   9 tablet   0   . ibuprofen (ADVIL,MOTRIN) 600 MG tablet   Oral   Take 1 tablet (600 mg total) by mouth every 8 (eight) hours as needed for pain.   30 tablet   0    LMP 01/07/2013 Physical Exam  Nursing note and vitals reviewed. Constitutional: She is oriented to person, place, and time. She appears well-developed and well-nourished. No distress.  HENT:  Head: Normocephalic and atraumatic.  Right Ear: External ear normal.  Left Ear: External ear normal.  Nose: Nose normal.  Mouth/Throat: Oropharynx is clear and moist.  Eyes: Conjunctivae are normal.  Neck: Normal range of motion.  Cardiovascular: Normal rate, regular rhythm and normal heart sounds.   Pulmonary/Chest: Effort normal and breath sounds normal. No stridor. No respiratory distress. She has no wheezes. She has no rales.  Abdominal: Soft. Bowel sounds are normal. She exhibits no distension. There is tenderness in the suprapubic area. There is no rigidity, no rebound, no guarding and no CVA tenderness.  Genitourinary: Cervix  exhibits discharge (white). Cervix exhibits no motion tenderness and no friability. Right adnexum displays no mass, no tenderness and no fullness. Left adnexum displays no mass, no tenderness and no fullness. No erythema, tenderness or bleeding around the vagina. No foreign body around the vagina. No signs of injury around the vagina. No vaginal discharge found.  Musculoskeletal: Normal range of motion.  Neurological: She is alert and oriented to person, place, and time. She has  normal strength.  Skin: Skin is warm and dry. She is not diaphoretic. No erythema.  Psychiatric: She has a normal mood and affect. Her behavior is normal.    ED Course  Procedures (including critical care time) Labs Review Labs Reviewed  WET PREP, GENITAL - Abnormal; Notable for the following:    Clue Cells Wet Prep HPF POC FEW (*)    WBC, Wet Prep HPF POC FEW (*)    All other components within normal limits  CBC WITH DIFFERENTIAL - Abnormal; Notable for the following:    Hemoglobin 11.3 (*)    HCT 34.1 (*)    All other components within normal limits  COMPREHENSIVE METABOLIC PANEL - Abnormal; Notable for the following:    Albumin 3.3 (*)    Total Bilirubin <0.2 (*)    GFR calc non Af Amer 77 (*)    GFR calc Af Amer 89 (*)    All other components within normal limits  URINALYSIS, ROUTINE W REFLEX MICROSCOPIC - Abnormal; Notable for the following:    APPearance HAZY (*)    All other components within normal limits  GC/CHLAMYDIA PROBE AMP  LIPASE, BLOOD  RPR  POCT PREGNANCY, URINE   Imaging Review No results found.  EKG Interpretation   None       MDM   1. Suprapubic pain   2. Dysuria   3. Bacterial vaginosis    Patient presents to the emergency department with dysuria and suprapubic abdominal pain. Abdomen is soft and nonsurgical. No cervical motion tenderness on pelvic exam. Some white vaginal discharge. Patient has dyspareunia. I will treat her for bacterial vaginosis. Discussed that she should not combine the Flagyl with alcohol. RPR is pending. She does not remember if this feels like the symptoms she had when she was diagnosed with syphilis. I encouraged patient to follow up at the health Department. Reasons to return to the emergency department immediately were discussed. Vital signs stable for discharge. Patient / Family / Caregiver informed of clinical course, understand medical decision-making process, and agree with plan.     Mora Bellman,  PA-C 01/28/13 1037

## 2013-01-28 NOTE — Discharge Instructions (Signed)
Abdominal Pain, Women °Abdominal (stomach, pelvic, or belly) pain can be caused by many things. It is important to tell your doctor: °· The location of the pain. °· Does it come and go or is it present all the time? °· Are there things that start the pain (eating certain foods, exercise)? °· Are there other symptoms associated with the pain (fever, nausea, vomiting, diarrhea)? °All of this is helpful to know when trying to find the cause of the pain. °CAUSES  °· Stomach: virus or bacteria infection, or ulcer. °· Intestine: appendicitis (inflamed appendix), regional ileitis (Crohn's disease), ulcerative colitis (inflamed colon), irritable bowel syndrome, diverticulitis (inflamed diverticulum of the colon), or cancer of the stomach or intestine. °· Gallbladder disease or stones in the gallbladder. °· Kidney disease, kidney stones, or infection. °· Pancreas infection or cancer. °· Fibromyalgia (pain disorder). °· Diseases of the female organs: °· Uterus: fibroid (non-cancerous) tumors or infection. °· Fallopian tubes: infection or tubal pregnancy. °· Ovary: cysts or tumors. °· Pelvic adhesions (scar tissue). °· Endometriosis (uterus lining tissue growing in the pelvis and on the pelvic organs). °· Pelvic congestion syndrome (female organs filling up with blood just before the menstrual period). °· Pain with the menstrual period. °· Pain with ovulation (producing an egg). °· Pain with an IUD (intrauterine device, birth control) in the uterus. °· Cancer of the female organs. °· Functional pain (pain not caused by a disease, may improve without treatment). °· Psychological pain. °· Depression. °DIAGNOSIS  °Your doctor will decide the seriousness of your pain by doing an examination. °· Blood tests. °· X-rays. °· Ultrasound. °· CT scan (computed tomography, special type of X-ray). °· MRI (magnetic resonance imaging). °· Cultures, for infection. °· Barium enema (dye inserted in the large intestine, to better view it with  X-rays). °· Colonoscopy (looking in intestine with a lighted tube). °· Laparoscopy (minor surgery, looking in abdomen with a lighted tube). °· Major abdominal exploratory surgery (looking in abdomen with a large incision). °TREATMENT  °The treatment will depend on the cause of the pain.  °· Many cases can be observed and treated at home. °· Over-the-counter medicines recommended by your caregiver. °· Prescription medicine. °· Antibiotics, for infection. °· Birth control pills, for painful periods or for ovulation pain. °· Hormone treatment, for endometriosis. °· Nerve blocking injections. °· Physical therapy. °· Antidepressants. °· Counseling with a psychologist or psychiatrist. °· Minor or major surgery. °HOME CARE INSTRUCTIONS  °· Do not take laxatives, unless directed by your caregiver. °· Take over-the-counter pain medicine only if ordered by your caregiver. Do not take aspirin because it can cause an upset stomach or bleeding. °· Try a clear liquid diet (broth or water) as ordered by your caregiver. Slowly move to a bland diet, as tolerated, if the pain is related to the stomach or intestine. °· Have a thermometer and take your temperature several times a day, and record it. °· Bed rest and sleep, if it helps the pain. °· Avoid sexual intercourse, if it causes pain. °· Avoid stressful situations. °· Keep your follow-up appointments and tests, as your caregiver orders. °· If the pain does not go away with medicine or surgery, you may try: °· Acupuncture. °· Relaxation exercises (yoga, meditation). °· Group therapy. °· Counseling. °SEEK MEDICAL CARE IF:  °· You notice certain foods cause stomach pain. °· Your home care treatment is not helping your pain. °· You need stronger pain medicine. °· You want your IUD removed. °· You feel faint or   lightheaded. °· You develop nausea and vomiting. °· You develop a rash. °· You are having side effects or an allergy to your medicine. °SEEK IMMEDIATE MEDICAL CARE IF:  °· Your  pain does not go away or gets worse. °· You have a fever. °· Your pain is felt only in portions of the abdomen. The right side could possibly be appendicitis. The left lower portion of the abdomen could be colitis or diverticulitis. °· You are passing blood in your stools (bright red or black tarry stools, with or without vomiting). °· You have blood in your urine. °· You develop chills, with or without a fever. °· You pass out. °MAKE SURE YOU:  °· Understand these instructions. °· Will watch your condition. °· Will get help right away if you are not doing well or get worse. °Document Released: 10/20/2006 Document Revised: 03/17/2011 Document Reviewed: 11/09/2008 °ExitCare® Patient Information ©2014 ExitCare, LLC. ° °Bacterial Vaginosis °Bacterial vaginosis is a vaginal infection that occurs when the normal balance of bacteria in the vagina is disrupted. It results from an overgrowth of certain bacteria. This is the most common vaginal infection in women of childbearing age. Treatment is important to prevent complications, especially in pregnant women, as it can cause a premature delivery. °CAUSES  °Bacterial vaginosis is caused by an increase in harmful bacteria that are normally present in smaller amounts in the vagina. Several different kinds of bacteria can cause bacterial vaginosis. However, the reason that the condition develops is not fully understood. °RISK FACTORS °Certain activities or behaviors can put you at an increased risk of developing bacterial vaginosis, including: °· Having a new sex partner or multiple sex partners. °· Douching. °· Using an intrauterine device (IUD) for contraception. °Women do not get bacterial vaginosis from toilet seats, bedding, swimming pools, or contact with objects around them. °SIGNS AND SYMPTOMS  °Some women with bacterial vaginosis have no signs or symptoms. Common symptoms include: °· Grey vaginal discharge. °· A fishlike odor with discharge, especially after sexual  intercourse. °· Itching or burning of the vagina and vulva. °· Burning or pain with urination. °DIAGNOSIS  °Your health care provider will take a medical history and examine the vagina for signs of bacterial vaginosis. A sample of vaginal fluid may be taken. Your health care provider will look at this sample under a microscope to check for bacteria and abnormal cells. A vaginal pH test may also be done.  °TREATMENT  °Bacterial vaginosis may be treated with antibiotic medicines. These may be given in the form of a pill or a vaginal cream. A second round of antibiotics may be prescribed if the condition comes back after treatment.  °HOME CARE INSTRUCTIONS  °· Only take over-the-counter or prescription medicines as directed by your health care provider. °· If antibiotic medicine was prescribed, take it as directed. Make sure you finish it even if you start to feel better. °· Do not have sex until treatment is completed. °· Tell all sexual partners that you have a vaginal infection. They should see their health care provider and be treated if they have problems, such as a mild rash or itching. °· Practice safe sex by using condoms and only having one sex partner. °SEEK MEDICAL CARE IF:  °· Your symptoms are not improving after 3 days of treatment. °· You have increased discharge or pain. °· You have a fever. °MAKE SURE YOU:  °· Understand these instructions. °· Will watch your condition. °· Will get help right away if you   are not doing well or get worse. FOR MORE INFORMATION  Centers for Disease Control and Prevention, Division of STD Prevention: SolutionApps.co.zawww.cdc.gov/std American Sexual Health Association (ASHA): www.ashastd.org  Document Released: 12/23/2004 Document Revised: 10/13/2012 Document Reviewed: 08/04/2012 [facility] Patient Information 2014 [place], [place].   Emergency Department Resource Guide 1) Find a Doctor and Pay Out of Pocket Although you won't have to find out who is covered by your insurance plan, it  is a good idea to ask around and get recommendations. You will then need to call the office and see if the doctor you have chosen will accept you as a new patient and what types of options they offer for patients who are self-pay. Some doctors offer discounts or will set up payment plans for their patients who do not have insurance, but you will need to ask so you aren't surprised when you get to your appointment.  2) Contact Your Local Health Department Not all health departments have doctors that can see patients for sick visits, but many do, so it is worth a call to see if yours does. If you don't know where your local health department is, you can check in your phone book. The CDC also has a tool to help you locate your state's health department, and many state websites also have listings of all of their local health departments.  3) Find a Walk-in Clinic If your illness is not likely to be very severe or complicated, you may want to try a walk in clinic. These are popping up all over the country in pharmacies, drugstores, and shopping centers. They're usually staffed by nurse practitioners or physician assistants that have been trained to treat common illnesses and complaints. They're usually fairly quick and inexpensive. However, if you have serious medical issues or chronic medical problems, these are probably not your best option.  No Primary Care Doctor: - Call Health Connect at  [phone] - they can help you locate a primary care doctor that  accepts your insurance, provides certain services, etc. - Physician Referral Service- [phone]  Chronic Pain Problems: Organization         Address  Phone   Notes  [facility]  [phone] Patients need to be referred by their primary care doctor.   Medication Assistance: Organization         Address  Phone   Notes  [facility] Medication [facility] [address] [place], [place] [address] [phone])  [phone] --Must be a resident of [place] [facility] -- Must have NO insurance coverage whatsoever (no Medicaid/ Medicare, etc.) -- The pt. MUST have a primary care doctor that directs their care regularly and follows them in the community   MedAssist  [phone]) [phone]   [employer]  [phone]    Agencies that provide inexpensive medical care: Organization         Address  Phone   Notes  [facility]  [phone]) [phone]   [facility]    [phone]   [facility] [address] [place], [place] [address] [phone]) [phone]   [facility] [place] [address], [place] [phone]   Planned Parenthood    [phone]   [facility]    [phone]   [facility]  [address], [place] Phone:  [phone]) [phone], Fax:  [phone] Hours of Operation:  9 am - 6 pm, M-F.  Also accepts Medicaid/Medicare and self-pay.  [facility]  [address], [place] Phone: [phone]) [phone], Fax: [phone]) [phone]. Hours of Operation:  8:30 am - 5:30 pm, M-F.  Also accepts Medicaid and self-pay.  [facility] [place] [address], [place] Phone: [phone]) [phone]   [facility] [address], [place], [place] [phone], Ext. [phone] Mondays & Thursdays: 7-9 AM.  First 15 patients are seen on a first come, first serve basis.    Medicaid-accepting [facility] Providers:  Organization         Address  Phone   Notes  [facility] [address], [place] [phone]) [phone] Also accepts self-pay patients.  [facility] [address], [place]  [phone]) [phone]   [facility] [address], [place] [phone]) [phone]   [facility] [address] [place] [address], [place] [phone]) [phone]   Renaye RakersVeita Bland [address], [place]   [phone])  [phone] Only accepts [place] Access [place] patients after they have their name applied to their card.   Self-Pay (no insurance) in [facility] [place]:  Organization         Address  Phone   Notes  Sickle Cell Patients, [facility] [address], [place] [phone]) [phone]   [facility] [address], [place] [phone](336) [phone]   [facility] [place]  [address], [place] [phone]   [facility]/Dr. Osei-Bonsu  [address] [place] [address], [place] or [id number], [place] [phone] Phone number for both [place] and [place] locations is the same.  [facility] [address], [place] [phone]) [phone]   [facility] [place] [address] [place] [address], [place] or [address] [phone](336) [phone]   [facility] [address], [place] [phone]) [phone], phone; [phone](336) [phone], fax Sees patients 1st and 3rd Saturday of every month.  Must not qualify for public or private insurance (i.e. Medicaid, Medicare, [place] Health Choice, Veterans' Benefits)  Household income should be no more than 200% of the poverty level The clinic cannot treat you if you are pregnant or think you are pregnant  Sexually transmitted diseases are not treated at the clinic.    Dental Care: Organization         Address  Phone  Notes  [facility] Department of [facility] [address], [place] [phone]) [phone] Accepts children up to age 38 who are enrolled in [place]icaid or [place] Health Choice; pregnant women with a Medicaid card; and children who have applied for Medicaid or [place] Health Choice, but were declined, whose parents can pay a reduced fee at time of service.  [facility] Department of [facility] [place]  [address], [place] [phone]) [phone] Accepts children up to age 38 who are enrolled in [place]icaid or [place] Health  Choice; pregnant women with a Medicaid card; and children who have applied for Medicaid or [place] Health Choice, but were declined, whose parents can pay a reduced fee at time of service.  [facility]  [address], [place] [phone] Patients are seen by appointment only. Walk-ins are not accepted. [facility] will see patients 38 years of age and older. Monday - Tuesday (  8am-5pm) Most Wednesdays (8:30-5pm) $30 per visit, cash only  [facility]  [address], [facility] [place] [phone] Patients are seen by appointment only. Walk-ins are not accepted. [facility] will see patients 38 years of age and older. One Wednesday Evening (Monthly: Volunteer Based).  $30 per visit, cash only  [facility]  [phone]) [phone] for adults; Children under age 464, call Graduate Pediatric Dentistry at [phone]) [phone]. Children aged 664-14, please call [phone] to request a pediatric application.  Dental services are provided in all areas of dental care including fillings, crowns and bridges, complete and partial dentures, implants, gum treatment, root canals, and extractions. Preventive care is also provided. Treatment is provided to both adults and children. Patients are selected via a lottery and there is often a waiting list.   [facility] [address], [place]  [phone]) [phone] [email or web address]   [facility] [address], [place], [place] [phone], Ext. [phone] Second and Fourth Thursday of each month, opens at 6:30 AM; Clinic ends at 9 AM.  Patients are seen on a first-come first-served basis, and a limited number are seen during each clinic.   [facility]  [address], [place], [place] [phone]) [phone]   Eligibility Requirements You must have lived in [place], [place], or [place] counties for at least the last three months.   You cannot be eligible for state or  federal sponsored National Cityhealthcare insurance, including CIGNAVeterans Administration, [place]icaid, or Harrah's EntertainmentMedicare.   You generally cannot be eligible for healthcare insurance through your employer.    How to apply: Eligibility screenings are held every Tuesday and Wednesday afternoon from 1:00 pm until 4:00 pm. You do not need an appointment for the interview!  [facility] [address], [place], [place] [phone]   [facility]  [phone]   [facility]  [phone]   [facility]  [phone]    Behavioral Health Resources in the Community: Intensive Outpatient Programs Organization         Address  Phone  Notes  [facility] [address], [place], [place] [phone]   [facility] [address], [place], [place] [phone]   [facility] [address], [place], [place]  [phone]   [facility] [address],  [place], [place] [phone] or [phone]   Substance Abuse Resources Organization         Address  Phone  Notes  [facility]  [phone]   [facility]  [phone]   [facility]  [phone]   [facility]  [phone]   Residential & Outpatient Substance Abuse Program  [phone]   Psychological Services Organization         Address  Phone  Notes  [facility]  [phone]- [phone]   [facility]  [phone]- [phone]   [facility] [address], [place] [phone] or [phone]    Mobile Crisis Teams Organization         Address  Phone  Notes  [facility], Mobile Crisis Care Unit  [phone]   [facility]  [address] [place], [place] [phone]   Doristine LocksSharon DeEsch [address] [place] [phone]    Self-Help/Support Groups Organization         Address  Phone  Notes  [facility] [place] - variety of support groups  336- I7437963 Call for more information  Narcotics Anonymous (NA), [facility] [address], [employer] [place]  2 meetings at this location   Statistician         Address  Phone  Notes  [facility] [address],    [place]  [phone]   [facility]  [address], [place] [address], [place], [place] [phone]   [facility] [address] [place], [place] [phone] Admissions: 8am-3pm M-F  [facility] [address]    [place], [place] [phone]   [facility] [address] [place], [place], [place] [phone]   [facility] [address],  [place], [place] [phone]   [facility] - Intensive Outpatient [address], [place], [place] [phone]   [facility] ([facility] [address]  [place], [place] [phone] or [phone]   [facility] ([facility]) [address], [place], [place] [phone] Accepts Medicaid  [facility] [address],  [place] [phone] Substance Abuse/Addiction Treatment   [facility] [place] [facility] Organization         Address  Phone  Notes  [facility]  [phone]   Angie Fava, PhD [address] [place], [place]   [phone] or [phone]   [facility]   [address] [place], [place] [phone]   [facility] [address] [place], [place] [phone] Insurance/Medicaid/sponsorship through [facility] and Families [address]                                    [place], [place] [phone] Therapy/tele-psych/case  [facility] [address], [place] [phone]    Dr. Lolly Mustache  [phone]   [facility]  [employer] [facility]  Dept. 1) [address], [place] 2) [address] [place] [address], [place] 3)  [address], [place] [phone]  [phone]   [facility] Child Abuse Hotline [phone] or [phone] (After Hours)

## 2013-01-29 LAB — GC/CHLAMYDIA PROBE AMP
CT PROBE, AMP APTIMA: NEGATIVE
GC Probe RNA: NEGATIVE

## 2013-01-29 LAB — RPR: RPR: NONREACTIVE

## 2013-01-29 NOTE — ED Provider Notes (Signed)
Medical screening examination/treatment/procedure(s) were performed by non-physician practitioner and as supervising physician I was immediately available for consultation/collaboration.  EKG Interpretation   None         Javious Hallisey T Kellie Chisolm, MD 01/29/13 1134 

## 2013-03-09 ENCOUNTER — Encounter (HOSPITAL_COMMUNITY): Payer: Self-pay | Admitting: Emergency Medicine

## 2013-03-09 ENCOUNTER — Emergency Department (HOSPITAL_COMMUNITY)
Admission: EM | Admit: 2013-03-09 | Discharge: 2013-03-09 | Disposition: A | Discharge status: Home / Self Care | Payer: No Typology Code available for payment source | Attending: Emergency Medicine | Admitting: Emergency Medicine

## 2013-03-09 DIAGNOSIS — J45909 Unspecified asthma, uncomplicated: Secondary | ICD-10-CM | POA: Insufficient documentation

## 2013-03-09 DIAGNOSIS — M25519 Pain in unspecified shoulder: Secondary | ICD-10-CM

## 2013-03-09 DIAGNOSIS — X500XXA Overexertion from strenuous movement or load, initial encounter: Secondary | ICD-10-CM | POA: Insufficient documentation

## 2013-03-09 DIAGNOSIS — Y990 Civilian activity done for income or pay: Secondary | ICD-10-CM | POA: Insufficient documentation

## 2013-03-09 DIAGNOSIS — Y9389 Activity, other specified: Secondary | ICD-10-CM | POA: Insufficient documentation

## 2013-03-09 DIAGNOSIS — Z79899 Other long term (current) drug therapy: Secondary | ICD-10-CM | POA: Insufficient documentation

## 2013-03-09 DIAGNOSIS — S4980XA Other specified injuries of shoulder and upper arm, unspecified arm, initial encounter: Secondary | ICD-10-CM | POA: Insufficient documentation

## 2013-03-09 DIAGNOSIS — Y929 Unspecified place or not applicable: Secondary | ICD-10-CM | POA: Insufficient documentation

## 2013-03-09 DIAGNOSIS — F17200 Nicotine dependence, unspecified, uncomplicated: Secondary | ICD-10-CM | POA: Insufficient documentation

## 2013-03-09 DIAGNOSIS — Z8619 Personal history of other infectious and parasitic diseases: Secondary | ICD-10-CM | POA: Insufficient documentation

## 2013-03-09 DIAGNOSIS — S46909A Unspecified injury of unspecified muscle, fascia and tendon at shoulder and upper arm level, unspecified arm, initial encounter: Secondary | ICD-10-CM | POA: Insufficient documentation

## 2013-03-09 DIAGNOSIS — Z880 Allergy status to penicillin: Secondary | ICD-10-CM | POA: Insufficient documentation

## 2013-03-09 MED ORDER — HYDROCODONE-ACETAMINOPHEN 5-325 MG PO TABS: 1.0000 | ORAL_TABLET | ORAL | Status: DC | PRN

## 2013-03-09 NOTE — ED Provider Notes (Signed)
CSN: [id number]     Arrival date & time 03/09/13  [id number] History   First MD Initiated Contact with Patient 03/09/13 [phone]     No chief complaint on file.    (Consider location/radiation/quality/duration/timing/severity/associated sxs/prior Treatment) HPI Comments: Pt states that she has had right shoulder pain for the last 10 days. Pt states that she hasn't had a fall, but she does lifting and moving boxes at work. Denies numbness or weakness. States that it feels fine as long as she doesn't try to lift it up or rotate it behind  The history is provided by the patient. No language interpreter was used.    Past Medical History  Diagnosis Date  . Asthma   . Syphilis     treated   Past Surgical History  Procedure Laterality Date  . No past surgeries     Family History  Problem Relation Age of Onset  . Anesthesia problems Neg Hx    History  Substance Use Topics  . Smoking status: Current Every Day Smoker -- 0.50 packs/day    Types: Cigarettes  . Smokeless tobacco: Not on file  . Alcohol Use: Yes     Comment: occasionally   OB History   Grav Para Term Preterm Abortions TAB SAB Ect Mult Living   1 0 0 0 1 0 1 0 0 0      Review of Systems  Constitutional: Negative.   Respiratory: Negative.   Neurological: Negative for weakness and numbness.      Allergies  Penicillins  Home Medications   Current Outpatient Rx  Name  Route  Sig  Dispense  Refill  . HYDROcodone-acetaminophen (NORCO/VICODIN) 5-325 MG per tablet   Oral   Take 1-2 tablets by mouth every 4 (four) hours as needed.   10 tablet   0   . metroNIDAZOLE (FLAGYL) 500 MG tablet   Oral   Take 1 tablet (500 mg total) by mouth 2 (two) times daily.   14 tablet   0    BP 125/49  Pulse 82  Temp(Src) 97.8 F (36.6 C) (Oral)  Resp 18  Ht 5\' 2"  (1.575 m)  Wt 233 lb (105.688 kg)  BMI 42.61 kg/m2  SpO2 99% Physical Exam  Nursing note and vitals reviewed. Constitutional: She is oriented to person, place, and  time. She appears well-developed and well-nourished.  Cardiovascular: Normal rate and regular rhythm.   Pulmonary/Chest: Breath sounds normal.  Musculoskeletal:  Tender in the anterior shoulder. No gross deformity. Pt has partial posterior rotation and lifting above shoulder height  Neurological: She is alert and oriented to person, place, and time.  Skin: Skin is warm and dry.  Psychiatric: She has a normal mood and affect.    ED Course  Procedures (including critical care time) Labs Review Labs Reviewed - No data to display Imaging Review No results found.   EKG Interpretation None      MDM   Final diagnoses:  Shoulder pain    No imaging needed as pt has had imaging in the last year.will treat symptomatically with vicodin and give referral to ortho:pt not having any neuro deficits    Teressa LowerVrinda Arzell Mcgeehan, NP 03/09/13 [phone]

## 2013-03-09 NOTE — ED Notes (Addendum)
Pain in rt shoulder x 1 1/2 weeks  Hurts to lift no injury she states

## 2013-03-09 NOTE — Discharge Instructions (Signed)
Musculoskeletal Pain °Musculoskeletal pain is muscle and boney aches and pains. These pains can occur in any part of the body. Your caregiver may treat you without knowing the cause of the pain. They may treat you if blood or urine tests, X-rays, and other tests were normal.  °CAUSES °There is often not a definite cause or reason for these pains. These pains may be caused by a type of germ (virus). The discomfort may also come from overuse. Overuse includes working out too hard when your body is not fit. Boney aches also come from weather changes. Bone is sensitive to atmospheric pressure changes. °HOME CARE INSTRUCTIONS  °· Ask when your test results will be ready. Make sure you get your test results. °· Only take over-the-counter or prescription medicines for pain, discomfort, or fever as directed by your caregiver. If you were given medications for your condition, do not drive, operate machinery or power tools, or sign legal documents for 24 hours. Do not drink alcohol. Do not take sleeping pills or other medications that may interfere with treatment. °· Continue all activities unless the activities cause more pain. When the pain lessens, slowly resume normal activities. Gradually increase the intensity and duration of the activities or exercise. °· During periods of severe pain, bed rest may be helpful. Lay or sit in any position that is comfortable. °· Putting ice on the injured area. °· Put ice in a bag. °· Place a towel between your skin and the bag. °· Leave the ice on for 15 to 20 minutes, 3 to 4 times a day. °· Follow up with your caregiver for continued problems and no reason can be found for the pain. If the pain becomes worse or does not go away, it may be necessary to repeat tests or do additional testing. Your caregiver may need to look further for a possible cause. °SEEK IMMEDIATE MEDICAL CARE IF: °· You have pain that is getting worse and is not relieved by medications. °· You develop chest pain  that is associated with shortness or breath, sweating, feeling sick to your stomach (nauseous), or throw up (vomit). °· Your pain becomes localized to the abdomen. °· You develop any new symptoms that seem different or that concern you. °MAKE SURE YOU:  °· Understand these instructions. °· Will watch your condition. °· Will get help right away if you are not doing well or get worse. °Document Released: 12/23/2004 Document Revised: 03/17/2011 Document Reviewed: 08/27/2012 °ExitCare® Patient Information ©2014 [employer]. ° °

## 2013-03-09 NOTE — ED Provider Notes (Signed)
Medical screening examination/treatment/procedure(s) were performed by non-physician practitioner and as supervising physician I was immediately available for consultation/collaboration.   EKG Interpretation None      Devoria AlbeIva Anh Mangano, MD, Armando GangFACEP   Ward GivensIva L Palmira Stickle, MD 03/09/13 [phone]

## 2013-06-29 ENCOUNTER — Emergency Department (HOSPITAL_COMMUNITY)
Admission: EM | Admit: 2013-06-29 | Discharge: 2013-06-29 | Disposition: A | Discharge status: Home / Self Care | Payer: No Typology Code available for payment source | Attending: Emergency Medicine | Admitting: Emergency Medicine

## 2013-06-29 ENCOUNTER — Encounter (HOSPITAL_COMMUNITY): Payer: Self-pay | Admitting: Emergency Medicine

## 2013-06-29 DIAGNOSIS — Z8619 Personal history of other infectious and parasitic diseases: Secondary | ICD-10-CM | POA: Insufficient documentation

## 2013-06-29 DIAGNOSIS — J45909 Unspecified asthma, uncomplicated: Secondary | ICD-10-CM | POA: Insufficient documentation

## 2013-06-29 DIAGNOSIS — Z139 Encounter for screening, unspecified: Secondary | ICD-10-CM

## 2013-06-29 DIAGNOSIS — Z130 Encounter for screening for diseases of the blood and blood-forming organs and certain disorders involving the immune mechanism: Secondary | ICD-10-CM | POA: Insufficient documentation

## 2013-06-29 DIAGNOSIS — F17200 Nicotine dependence, unspecified, uncomplicated: Secondary | ICD-10-CM | POA: Insufficient documentation

## 2013-06-29 DIAGNOSIS — Z880 Allergy status to penicillin: Secondary | ICD-10-CM | POA: Insufficient documentation

## 2013-06-29 LAB — I-STAT CHEM 8, ED
BUN: 3 mg/dL — AB (ref 6–23)
Calcium, Ion: 1.25 mmol/L — ABNORMAL HIGH (ref 1.12–1.23)
Chloride: 101 mEq/L (ref 96–112)
Creatinine, Ser: 0.9 mg/dL (ref 0.50–1.10)
Glucose, Bld: 84 mg/dL (ref 70–99)
HCT: 40 % (ref 36.0–46.0)
Hemoglobin: 13.6 g/dL (ref 12.0–15.0)
POTASSIUM: 4.1 meq/L (ref 3.7–5.3)
SODIUM: 141 meq/L (ref 137–147)
TCO2: 25 mmol/L (ref 0–100)

## 2013-06-29 NOTE — ED Provider Notes (Signed)
CSN: [id number]     Arrival date & time 06/29/13  [id number] History   First MD Initiated Contact with Patient 06/29/13 [phone]     Chief Complaint  Patient presents with  . Anemia      HPI Pt was seen at 0910. Per pt, c/o gradual onset and persistence of constant "low Hct levels" for the past several months to years. Pt states she has been donating plasma was told to "go to the ER to get checked out" by the donating center. Pt denies any complaints. Denies CP/palpitations, no SOB/cough, no abd pain, no N/V/D, no back pain, no rash, no fever.    Past Medical History  Diagnosis Date  . Asthma   . Syphilis     treated   Past Surgical History  Procedure Laterality Date  . No past surgeries     Family History  Problem Relation Age of Onset  . Anesthesia problems Neg Hx    History  Substance Use Topics  . Smoking status: Current Every Day Smoker -- 0.50 packs/day    Types: Cigarettes  . Smokeless tobacco: Not on file  . Alcohol Use: Yes     Comment: occasionally   OB History   Grav Para Term Preterm Abortions TAB SAB Ect Mult Living   1 0 0 0 1 0 1 0 0 0      Review of Systems ROS: Statement: All systems negative except as marked or noted in the HPI; Constitutional: Negative for fever and chills. ; ; Eyes: Negative for eye pain, redness and discharge. ; ; ENMT: Negative for ear pain, hoarseness, nasal congestion, sinus pressure and sore throat. ; ; Cardiovascular: Negative for chest pain, palpitations, diaphoresis, dyspnea and peripheral edema. ; ; Respiratory: Negative for cough, wheezing and stridor. ; ; Gastrointestinal: Negative for nausea, vomiting, diarrhea, abdominal pain, blood in stool, hematemesis, jaundice and rectal bleeding. . ; ; Genitourinary: Negative for dysuria, flank pain and hematuria. ; ; Musculoskeletal: Negative for back pain and neck pain. Negative for swelling and trauma.; ; Skin: Negative for pruritus, rash, abrasions, blisters, bruising and skin lesion.; ; Neuro:  Negative for headache, lightheadedness and neck stiffness. Negative for weakness, altered level of consciousness , altered mental status, extremity weakness, paresthesias, involuntary movement, seizure and syncope.      Allergies  Penicillins  Home Medications   Prior to Admission medications   Not on File   BP 133/80  Temp(Src) 98.3 F (36.8 C) (Oral)  Resp 24  SpO2 100%  LMP 06/10/2013 Physical Exam 0915: Physical examination:  Nursing notes reviewed; Vital signs and O2 SAT reviewed;  Constitutional: Well developed, Well nourished, Well hydrated, In no acute distress; Head:  Normocephalic, atraumatic; Eyes: EOMI, PERRL, No scleral icterus; ENMT: Mouth and pharynx normal, Mucous membranes moist; Neck: Supple, Full range of motion, No lymphadenopathy; Cardiovascular: Regular rate and rhythm, No murmur, rub, or gallop; Respiratory: Breath sounds clear & equal bilaterally, No rales, rhonchi, wheezes.  Speaking full sentences with ease, Normal respiratory effort/excursion; Chest: Nontender, Movement normal; Abdomen: Soft, Nontender, Nondistended, Normal bowel sounds; Genitourinary: No CVA tenderness; Extremities: Pulses normal, No tenderness, No edema, No calf edema or asymmetry.; Neuro: AA&Ox3, Major CN grossly intact.  Speech clear. No gross focal motor or sensory deficits in extremities.; Skin: Color normal, Warm, Dry.   ED Course  Procedures     EKG Interpretation None      MDM  MDM Reviewed: previous chart, nursing note and vitals Interpretation: labs     Results  for orders placed during the hospital encounter of 06/29/13  I-STAT CHEM 8, ED      Result Value Ref Range   Sodium 141  137 - 147 mEq/L   Potassium 4.1  3.7 - 5.3 mEq/L   Chloride 101  96 - 112 mEq/L   BUN 3 (*) 6 - 23 mg/dL   Creatinine, Ser 1.610.90  0.50 - 1.10 mg/dL   Glucose, Bld 84  70 - 99 mg/dL   Calcium, Ion 0.961.25 (*) 1.12 - 1.23 mmol/L   TCO2 25  0 - 100 mmol/L   Hemoglobin 13.6  12.0 - 15.0 g/dL    HCT 04.540.0  40.936.0 - 81.146.0 %     1025:  H/H per baseline. Dx and testing d/w pt.  Questions answered.  Verb understanding, agreeable to d/c home with outpt f/u.     Laray AngerKathleen M McManus, DO 07/02/13 2230

## 2013-06-29 NOTE — ED Notes (Signed)
Pt reports needing to be evaluated for anemia. Has been donating plasma and sent here due to repeatedly having low HCT levels. No distress noted, no complaints.

## 2013-06-29 NOTE — Discharge Instructions (Signed)
°Emergency Department Resource Guide °1) Find a Doctor and Pay Out of Pocket °Although you won't have to find out who is covered by your insurance plan, it is a good idea to ask around and get recommendations. You will then need to call the office and see if the doctor you have chosen will accept you as a new patient and what types of options they offer for patients who are self-pay. Some doctors offer discounts or will set up payment plans for their patients who do not have insurance, but you will need to ask so you aren't surprised when you get to your appointment. ° °2) Contact Your Local Health Department °Not all health departments have doctors that can see patients for sick visits, but many do, so it is worth a call to see if yours does. If you don't know where your local health department is, you can check in your phone book. The CDC also has a tool to help you locate your state's health department, and many state websites also have listings of all of their local health departments. ° °3) Find a Walk-in Clinic °If your illness is not likely to be very severe or complicated, you may want to try a walk in clinic. These are popping up all over the country in pharmacies, drugstores, and shopping centers. They're usually staffed by nurse practitioners or physician assistants that have been trained to treat common illnesses and complaints. They're usually fairly quick and inexpensive. However, if you have serious medical issues or chronic medical problems, these are probably not your best option. ° °No Primary Care Doctor: °- Call Health Connect at  832-8000 - they can help you locate a primary care doctor that  accepts your insurance, provides certain services, etc. °- Physician Referral Service- 1-800-533-3463 ° °Chronic Pain Problems: °Organization         Address  Phone   Notes  °[facility]  ([phone]) [phone] Patients need to be referred by their primary care doctor.  ° °Medication  Assistance: °Organization         Address  Phone   Notes  °[place] [facility] [address] °[place], [place] [address] (336) 641-8030 --Must be a resident of [place] °-- Must have NO insurance coverage whatsoever (no Medicaid/ Medicare, etc.) °-- The pt. MUST have a primary care doctor that directs their care regularly and follows them in the community °  °[facility]  (866) 331-1348   °[employer] Way  (888) 892-1162   ° °Agencies that provide inexpensive medical care: °Organization         Address  Phone   Notes  °[facility]  (336) 832-8035   °[facility]    (336) 832-7272   °[facility] [address] °[place], [place] [address] (336) 832-4777   °[facility] [place] [facility] [address], °[place] (336) 271-4999   °[facility]    (336) 373-0678   °[facility]    (336) 272-1050   °[facility] ° [address], [place] Phone:  (336) 832-4444, Fax:  (336) 832-4440 Hours of Operation:  9 am - 6 pm, M-F.  Also accepts Medicaid/Medicare and self-pay.  °[facility] ° [address], [place] Phone: (336) [phone], Fax: (336) [phone]. Hours of Operation:  8:30 am - 5:30 pm, M-F.  Also accepts Medicaid and self-pay.  °[facility] [place] [address], [place] Phone: (336) 878-6027   °[facility] [address], [place], [place] (336)723-1848, Ext. 123 Mondays & Thursdays: 7-9 AM.  First 15 patients are seen on a first come, first serve basis. °  ° °Medicaid-accepting [place] Providers: ° °Organization         Address  Phone   Notes  °[facility] [address], [place] (336) 641-2100 Also accepts self-pay patients.  °[facility] [address], [place] ° (336) 856-9996   °[facility] [address], [place]  (336) 288-8857   °[facility] [address] [place] [address], [place] (336) 299-7000   °Veita Bland [address], [place]  ° (336) 373-1557 Only accepts [place] Access Medicaid patients after they have their name applied to their card.  ° °Self-Pay (no insurance) in [place]: ° °Organization         Address  Phone   Notes  °Sickle Cell Patients, [facility] [address], [place] (336) 832-1970   °[facility] [address], [place] (336) 832-4400   °[facility] ° [address], [place] (336) 992-4800   °[facility]/Dr. Osei-Bonsu ° [address] [place] [address], [place] or [address], [place] (336) [phone] Phone number for both [place] and [place] locations is the same.  °[facility] [address], [place] (336) 299-0000   °[facility] [address] [place] [address], [place] or [address] (336) 852-7530 °(336) 878-2260   °[facility] [address], [place] (336) 350-1642, phone; (336) 294-5005, fax Sees patients 1st and 3rd Saturday of every month.  Must not qualify for public or private insurance (i.e. Medicaid, Medicare, [place] Health Choice, Veterans' Benefits) • Household income should be no more than 200% of the poverty level •The clinic cannot treat you if you are pregnant or think you are pregnant • Sexually transmitted diseases are not treated at the clinic.  ° ° °Dental Care: °Organization         Address  Phone  Notes  °[place] [facility] [address], [place] (336) 641-6152 Accepts children up to age 21 who are enrolled in Medicaid or [place] Health Choice; pregnant women with a Medicaid card; and children who have applied for Medicaid or [place] Health Choice, but were declined, whose parents can pay a reduced fee at time of service.  °[place]  [facility] [place]  [address], [place] (336) 641-7733 Accepts children up to age 21 who are enrolled in Medicaid or [place] Health Choice; pregnant women with a Medicaid card; and children who have applied for Medicaid or [place] Health Choice, but were declined, whose parents can pay a reduced fee at time of service.  °[facility] ° [address], [place] (336) 641-4533 Patients are seen by appointment only. Walk-ins are not accepted. [facility] will see patients 18 years of age and older. °Monday - Tuesday (8am-5pm) °Most Wednesdays (8:30-5pm) °$30 per visit, cash only  °[facility] ° [address], [place] (336) 641-4533 Patients are seen by appointment only. Walk-ins are not accepted. [facility] will see patients 18 years of age and older. °One   Wednesday Evening (Monthly: Volunteer Based).  $30 per visit, cash only  °[facility]  (919) 537-3737 for adults; Children under age 4, call Graduate Pediatric Dentistry at (919) [phone]. Children aged 4-14, please call ([phone]) [phone] to request a pediatric application. ° Dental services are provided in all areas of dental care including fillings, crowns and bridges, complete and partial dentures, implants, gum treatment, root canals, and extractions. Preventive care is also provided. Treatment is provided to both adults and children. °Patients are selected via a lottery and there is often a waiting list. °  °[facility] [address], °[place] ° (336) 763-8833 [email or web address] °  °[facility] [address], [place], [place] (336)723-1848, Ext. 123 Second and Fourth Thursday of each month, opens at 6:30 AM; Clinic ends at 9 AM.  Patients are seen on a first-come first-served basis, and a limited number are seen during each clinic.  ° °[facility] ° [address], [place], [place] (336) 723-7904    Eligibility Requirements °You must have lived in [place], [place], or [place] counties for at least the last three months. °  You cannot be eligible for state or federal sponsored healthcare insurance, including Veterans Administration, Medicaid, or Medicare. °  You generally cannot be eligible for healthcare insurance through your employer.  °  How to apply: °Eligibility screenings are held every Tuesday and Wednesday afternoon from 1:00 pm until 4:00 pm. You do not need an appointment for the interview!  °[facility] [address], [place], [place] 336-631-2330   °[facility]  336-342-8273   °[facility]  336-703-3100   °[facility]  336-570-6415   ° °Behavioral Health Resources in the Community: °Intensive Outpatient Programs °Organization         Address  Phone  Notes  °[facility] [address], [place], [place] 336-878-6098   °[facility] [address], [place], [place] 336-832-9800   °[facility] [address], [place], [place] ° 336-882-2125   °[facility] [address],  °[place], [place] 1-800-853-5163 or 336-641-4981   °Substance Abuse Resources °Organization         Address  Phone  Notes  °[facility]  336-882-2125   °[facility]  336-784-9470   °[facility]  336-285-9073   °[facility]  336-845-3988   °Residential & Outpatient Substance Abuse Program  1-800-659-3381   °Psychological Services °Organization         Address  Phone  Notes  °[facility]  336- 832-9600   °[facility]  336- 378-7881   °[facility] [address], [place] 1-800-853-5163 or 336-641-4981   ° °Mobile Crisis Teams °Organization         Address  Phone  Notes  °[facility], Mobile Crisis Care Unit  1-877-626-1772   °Assertive °[facility] ° [address]  [place], [place] 336-834-9664   °Sharon DeEsch [address] °[place] 336-554-5454   ° °Self-Help/Support Groups °Organization         Address  Phone             Notes  °[facility] of  - variety of support groups  336- 373-1402 Call for more information  °Narcotics Anonymous (NA), [facility] [address], °[place]  2 meetings at this location  ° °  Residential Treatment Programs Organization         Address  Phone  Notes  [facility] [address],    [place]  [phone]   [facility]  [address], [place] [phone], [place], [place] [phone]   [facility] [address], [place] [phone] Admissions: 8am-3pm M-F  [facility] [address]    [place], [place] [phone]   [facility] [address], [place], [place] [phone]   [facility] [address]  [place], [place] [phone]) [phone]   [facility] Intensive Outpatient [address], [place], [place] [phone]   [facility] [address]  [place], [place] [phone])[phone] or [phone]   [facility] [address] [place], [place] [phone] Accepts Medicaid  [facility] [address]  [place] [phone] Substance Abuse/Addiction Treatment   [facility]         Address  Phone  Notes  [facility]  707-687-6007(888) [phone]   Angie FavaJulie Brannon, PhD [address], [address] [place], [place]   [phone](336) (206) [phone] or [phone](336) [phone]   [facility]   [address] [place], [place] [phone](336) [phone]   [facility] [address], [place], [place] [phone](336) [phone] Insurance/Medicaid/sponsorship through [facility]  [facility] [address]                                    [place], [place] [phone](336) [phone] Therapy/tele-psych/case    [facility] [address]   [place], [place] [phone](336) [phone]    Dr. Lolly MustacheArfeen  [phone]) [phone]   [facility]  [employer] [facility] 1) [address], [place] 2) [address], [place] 3)  [address], [place] [phone]) [phone](336) [phone]  [phone]) [phone]   [facility] Child Abuse Hotline 603-863-7733(336) (540)743-0736 or (623) [phone](336) [phone] (After Hours)       Your hemoglobin and hematocrit levels were normal today. Call your regular medical doctor today to schedule a follow up appointment within the next 2 weeks.  Return to the Emergency Department immediately sooner if worsening.

## 2013-09-09 ENCOUNTER — Encounter: Payer: No Typology Code available for payment source | Admitting: Medical

## 2013-11-07 ENCOUNTER — Encounter (HOSPITAL_COMMUNITY): Payer: Self-pay | Admitting: Emergency Medicine

## 2013-11-21 ENCOUNTER — Emergency Department (HOSPITAL_COMMUNITY)
Admission: EM | Admit: 2013-11-21 | Discharge: 2013-11-21 | Disposition: A | Discharge status: Home / Self Care | Payer: No Typology Code available for payment source | Attending: Emergency Medicine | Admitting: Emergency Medicine

## 2013-11-21 ENCOUNTER — Encounter (HOSPITAL_COMMUNITY): Payer: Self-pay | Admitting: Emergency Medicine

## 2013-11-21 DIAGNOSIS — R197 Diarrhea, unspecified: Secondary | ICD-10-CM | POA: Insufficient documentation

## 2013-11-21 DIAGNOSIS — Z8742 Personal history of other diseases of the female genital tract: Secondary | ICD-10-CM | POA: Insufficient documentation

## 2013-11-21 DIAGNOSIS — R112 Nausea with vomiting, unspecified: Secondary | ICD-10-CM | POA: Insufficient documentation

## 2013-11-21 DIAGNOSIS — Z880 Allergy status to penicillin: Secondary | ICD-10-CM | POA: Insufficient documentation

## 2013-11-21 DIAGNOSIS — J45909 Unspecified asthma, uncomplicated: Secondary | ICD-10-CM | POA: Insufficient documentation

## 2013-11-21 DIAGNOSIS — Z720 Tobacco use: Secondary | ICD-10-CM | POA: Insufficient documentation

## 2013-11-21 DIAGNOSIS — Z8619 Personal history of other infectious and parasitic diseases: Secondary | ICD-10-CM | POA: Insufficient documentation

## 2013-11-21 DIAGNOSIS — Z3202 Encounter for pregnancy test, result negative: Secondary | ICD-10-CM | POA: Insufficient documentation

## 2013-11-21 DIAGNOSIS — R1032 Left lower quadrant pain: Secondary | ICD-10-CM | POA: Insufficient documentation

## 2013-11-21 LAB — CBC WITH DIFFERENTIAL/PLATELET
Basophils Absolute: 0 10*3/uL (ref 0.0–0.1)
Basophils Relative: 0 % (ref 0–1)
Eosinophils Absolute: 0 10*3/uL (ref 0.0–0.7)
Eosinophils Relative: 1 % (ref 0–5)
HCT: 36 % (ref 36.0–46.0)
HEMOGLOBIN: 11.6 g/dL — AB (ref 12.0–15.0)
LYMPHS ABS: 2.1 10*3/uL (ref 0.7–4.0)
LYMPHS PCT: 39 % (ref 12–46)
MCH: 26.9 pg (ref 26.0–34.0)
MCHC: 32.2 g/dL (ref 30.0–36.0)
MCV: 83.3 fL (ref 78.0–100.0)
MONOS PCT: 7 % (ref 3–12)
Monocytes Absolute: 0.3 10*3/uL (ref 0.1–1.0)
NEUTROS PCT: 53 % (ref 43–77)
Neutro Abs: 2.8 10*3/uL (ref 1.7–7.7)
Platelets: 317 10*3/uL (ref 150–400)
RBC: 4.32 MIL/uL (ref 3.87–5.11)
RDW: 13.8 % (ref 11.5–15.5)
WBC: 5.3 10*3/uL (ref 4.0–10.5)

## 2013-11-21 LAB — URINALYSIS, ROUTINE W REFLEX MICROSCOPIC
BILIRUBIN URINE: NEGATIVE
Glucose, UA: NEGATIVE mg/dL
HGB URINE DIPSTICK: NEGATIVE
Ketones, ur: NEGATIVE mg/dL
Leukocytes, UA: NEGATIVE
Nitrite: NEGATIVE
PROTEIN: NEGATIVE mg/dL
Specific Gravity, Urine: 1.019 (ref 1.005–1.030)
Urobilinogen, UA: 0.2 mg/dL (ref 0.0–1.0)
pH: 6.5 (ref 5.0–8.0)

## 2013-11-21 LAB — COMPREHENSIVE METABOLIC PANEL
ALK PHOS: 61 U/L (ref 39–117)
ALT: 8 U/L (ref 0–35)
AST: 16 U/L (ref 0–37)
Albumin: 3.6 g/dL (ref 3.5–5.2)
Anion gap: 10 (ref 5–15)
BUN: 7 mg/dL (ref 6–23)
CHLORIDE: 108 meq/L (ref 96–112)
CO2: 25 meq/L (ref 19–32)
Calcium: 8.6 mg/dL (ref 8.4–10.5)
Creatinine, Ser: 0.82 mg/dL (ref 0.50–1.10)
GFR calc non Af Amer: >90 mL/min (ref >90)
GLUCOSE: 84 mg/dL (ref 70–99)
POTASSIUM: 4.3 meq/L (ref 3.7–5.3)
SODIUM: 143 meq/L (ref 137–147)
Total Protein: 7.4 g/dL (ref 6.0–8.3)

## 2013-11-21 LAB — POC URINE PREG, ED: Preg Test, Ur: NEGATIVE

## 2013-11-21 MED ORDER — PROMETHAZINE HCL 25 MG PO TABS: 25.0000 mg | ORAL_TABLET | Freq: Four times a day (QID) | ORAL | Status: DC | PRN

## 2013-11-21 MED ORDER — ONDANSETRON HCL 4 MG/2ML IJ SOLN
4.0000 mg | Freq: Once | INTRAMUSCULAR | Status: AC
  Administered 2013-11-21: 4 mg via INTRAVENOUS
  Filled 2013-11-21: qty 2

## 2013-11-21 MED ORDER — MORPHINE SULFATE 4 MG/ML IJ SOLN
4.0000 mg | Freq: Once | INTRAMUSCULAR | Status: AC
  Administered 2013-11-21: 4 mg via INTRAVENOUS
  Filled 2013-11-21: qty 1

## 2013-11-21 MED ORDER — SODIUM CHLORIDE 0.9 % IV BOLUS (SEPSIS)
1000.0000 mL | Freq: Once | INTRAVENOUS | Status: AC
  Administered 2013-11-21: 1000 mL via INTRAVENOUS

## 2013-11-21 NOTE — Discharge Instructions (Signed)
Abdominal Pain, Women °Abdominal (stomach, pelvic, or belly) pain can be caused by many things. It is important to tell your doctor: °· The location of the pain. °· Does it come and go or is it present all the time? °· Are there things that start the pain (eating certain foods, exercise)? °· Are there other symptoms associated with the pain (fever, nausea, vomiting, diarrhea)? °All of this is helpful to know when trying to find the cause of the pain. °CAUSES  °· Stomach: virus or bacteria infection, or ulcer. °· Intestine: appendicitis (inflamed appendix), regional ileitis (Crohn's disease), ulcerative colitis (inflamed colon), irritable bowel syndrome, diverticulitis (inflamed diverticulum of the colon), or cancer of the stomach or intestine. °· Gallbladder disease or stones in the gallbladder. °· Kidney disease, kidney stones, or infection. °· Pancreas infection or cancer. °· Fibromyalgia (pain disorder). °· Diseases of the female organs: °¨ Uterus: fibroid (non-cancerous) tumors or infection. °¨ Fallopian tubes: infection or tubal pregnancy. °¨ Ovary: cysts or tumors. °¨ Pelvic adhesions (scar tissue). °¨ Endometriosis (uterus lining tissue growing in the pelvis and on the pelvic organs). °¨ Pelvic congestion syndrome (female organs filling up with blood just before the menstrual period). °¨ Pain with the menstrual period. °¨ Pain with ovulation (producing an egg). °¨ Pain with an IUD (intrauterine device, birth control) in the uterus. °¨ Cancer of the female organs. °· Functional pain (pain not caused by a disease, may improve without treatment). °· Psychological pain. °· Depression. °DIAGNOSIS  °Your doctor will decide the seriousness of your pain by doing an examination. °· Blood tests. °· X-rays. °· Ultrasound. °· CT scan (computed tomography, special type of X-ray). °· MRI (magnetic resonance imaging). °· Cultures, for infection. °· Barium enema (dye inserted in the large intestine, to better view it with  X-rays). °· Colonoscopy (looking in intestine with a lighted tube). °· Laparoscopy (minor surgery, looking in abdomen with a lighted tube). °· Major abdominal exploratory surgery (looking in abdomen with a large incision). °TREATMENT  °The treatment will depend on the cause of the pain.  °· Many cases can be observed and treated at home. °· Over-the-counter medicines recommended by your caregiver. °· Prescription medicine. °· Antibiotics, for infection. °· Birth control pills, for painful periods or for ovulation pain. °· Hormone treatment, for endometriosis. °· Nerve blocking injections. °· Physical therapy. °· Antidepressants. °· Counseling with a psychologist or psychiatrist. °· Minor or major surgery. °HOME CARE INSTRUCTIONS  °· Do not take laxatives, unless directed by your caregiver. °· Take over-the-counter pain medicine only if ordered by your caregiver. Do not take aspirin because it can cause an upset stomach or bleeding. °· Try a clear liquid diet (broth or water) as ordered by your caregiver. Slowly move to a bland diet, as tolerated, if the pain is related to the stomach or intestine. °· Have a thermometer and take your temperature several times a day, and record it. °· Bed rest and sleep, if it helps the pain. °· Avoid sexual intercourse, if it causes pain. °· Avoid stressful situations. °· Keep your follow-up appointments and tests, as your caregiver orders. °· If the pain does not go away with medicine or surgery, you may try: °¨ Acupuncture. °¨ Relaxation exercises (yoga, meditation). °¨ Group therapy. °¨ Counseling. °SEEK MEDICAL CARE IF:  °· You notice certain foods cause stomach pain. °· Your home care treatment is not helping your pain. °· You need stronger pain medicine. °· You want your IUD removed. °· You feel faint or   lightheaded. °· You develop nausea and vomiting. °· You develop a rash. °· You are having side effects or an allergy to your medicine. °SEEK IMMEDIATE MEDICAL CARE IF:  °· Your  pain does not go away or gets worse. °· You have a fever. °· Your pain is felt only in portions of the abdomen. The right side could possibly be appendicitis. The left lower portion of the abdomen could be colitis or diverticulitis. °· You are passing blood in your stools (bright red or black tarry stools, with or without vomiting). °· You have blood in your urine. °· You develop chills, with or without a fever. °· You pass out. °MAKE SURE YOU:  °· Understand these instructions. °· Will watch your condition. °· Will get help right away if you are not doing well or get worse. °Document Released: 10/20/2006 Document Revised: 05/09/2013 Document Reviewed: 11/09/2008 °ExitCare® Patient Information ©2015 [employer]. This information is not intended to replace advice given to you by your health care provider. Make sure you discuss any questions you have with your health care provider. ° °

## 2013-11-21 NOTE — ED Notes (Addendum)
Left lower abd pain x 3 months and n/v started 3 days ago  LMP 11/16/13 some diarrhea yesterday has had vag d/c with odor and urine has bad smell also has been tx for bacterial vaginosis

## 2013-11-21 NOTE — ED Provider Notes (Signed)
CSN: [id number]     Arrival date & time 11/21/13  0800 History   First MD Initiated Contact with Patient 11/21/13 0818     Chief Complaint  Patient presents with  . Abdominal Pain     (Consider location/radiation/quality/duration/timing/severity/associated sxs/prior Treatment) HPI  Pt is a 37yo obese female presenting to ED with c/o left lower abdominal pain per triage note- 3months, however, on exam, pt reports abdominal pain, n/v/d for 2-3 days.  Pt state she had some diarrhea yesterday w/o blood or mucous in stool and had 1 episode of non-bloody, non-bilious emesis this morning.  Per triage note pt reports some vaginal discharge with odor a few weeks ago, was treated for BV but states symptoms have since resolved. Denies fever or chills. States her son has been home with with cough, congestion, and vomiting as well. No recent travel. No other significant PMH.   Past Medical History  Diagnosis Date  . Asthma   . Syphilis     treated   Past Surgical History  Procedure Laterality Date  . No past surgeries     Family History  Problem Relation Age of Onset  . Anesthesia problems Neg Hx    History  Substance Use Topics  . Smoking status: Current Every Day Smoker -- 0.50 packs/day    Types: Cigarettes  . Smokeless tobacco: Not on file  . Alcohol Use: Yes     Comment: occasionally   OB History    Gravida Para Term Preterm AB TAB SAB Ectopic Multiple Living   1 0 0 0 1 0 1 0 0 0      Review of Systems  Constitutional: Negative for fever and chills.  Respiratory: Negative for cough and shortness of breath.   Cardiovascular: Negative for chest pain and palpitations.  Gastrointestinal: Positive for nausea, vomiting, abdominal pain and diarrhea. Negative for constipation.  Genitourinary: Negative for dysuria, urgency, frequency, hematuria, flank pain, decreased urine volume, vaginal bleeding, vaginal discharge, vaginal pain, menstrual problem and pelvic pain.  Musculoskeletal:  Negative for myalgias and back pain.  All other systems reviewed and are negative.     Allergies  Penicillins  Home Medications   Prior to Admission medications   Medication Sig Start Date End Date Taking? Authorizing Provider  aspirin EC 325 MG tablet Take 650 mg by mouth 2 (two) times daily as needed (for pain).   Yes Historical Provider, MD  promethazine (PHENERGAN) 25 MG tablet Take 1 tablet (25 mg total) by mouth every 6 (six) hours as needed for nausea or vomiting. 11/21/13   Junius FinnerErin O'Malley, PA-C   BP 143/86 mmHg  Pulse 70  Temp(Src) 97.4 F (36.3 C) (Oral)  Resp 16  SpO2 99% Physical Exam  Constitutional: She appears well-developed and well-nourished. No distress.  Pt lying comfortably in exam bed, NAD.  HENT:  Head: Normocephalic and atraumatic.  Eyes: Conjunctivae are normal. No scleral icterus.  Neck: Normal range of motion. Neck supple.  Cardiovascular: Normal rate, regular rhythm and normal heart sounds.   Pulmonary/Chest: Effort normal and breath sounds normal. No respiratory distress. She has no wheezes. She has no rales. She exhibits no tenderness.  Abdominal: Soft. Bowel sounds are normal. She exhibits no distension and no mass. There is no tenderness. There is no rebound and no guarding.  Obese abdomen, soft, non-tender.   Musculoskeletal: Normal range of motion.  Neurological: She is alert.  Skin: Skin is warm and dry. She is not diaphoretic.  Nursing note and vitals reviewed.  ED Course  Procedures (including critical care time) Labs Review Labs Reviewed  CBC WITH DIFFERENTIAL - Abnormal; Notable for the following:    Hemoglobin 11.6 (*)    All other components within normal limits  COMPREHENSIVE METABOLIC PANEL - Abnormal; Notable for the following:    Total Bilirubin <0.2 (*)    All other components within normal limits  URINALYSIS, ROUTINE W REFLEX MICROSCOPIC  POC URINE PREG, ED    Imaging Review No results found.   EKG  Interpretation None      MDM   Final diagnoses:  Left lower quadrant pain  Nausea vomiting and diarrhea    Pt c/o left lower abdominal pain with n/v/d that started 3 days ago.  Triage note reports 3months ago. Pt most concerned with nausea and vomiting that started 3 days ago. Reports son is sick at home. Pt denies current urinary or vaginal symptoms. Pt is afebrile, non-toxic appearing. Moist mucous membranes. Abd- obese, soft, non-tender.   Labs: unremarkable.  Pt given IV fluids, morphine and zofran in ED.  Abdominal pain improved from 8/10 to 2/10 in ED after tx.  Pt able to keep down several ounces of PO fluids. No evidence of emergent process taking place at this time, including but not limited to appendicitis, cholecystitis, SBO, or ovarian torsion. No further workup indicated at this time. Symptoms likely viral in nature. Will discharge home with phenergan. Home care instructions provided. Advised to f/u with PCP for recheck of symptoms as needed. Return precautions provided. Pt verbalized understanding and agreement with tx plan.   Junius Finnerrin O'Malley, PA-C 11/21/13 1526  Linwood DibblesJon Knapp, MD 11/22/13 [phone]

## 2013-11-23 ENCOUNTER — Telehealth (HOSPITAL_BASED_OUTPATIENT_CLINIC_OR_DEPARTMENT_OTHER): Payer: Self-pay | Admitting: Emergency Medicine

## 2013-11-23 ENCOUNTER — Encounter (HOSPITAL_COMMUNITY): Payer: Self-pay | Admitting: Emergency Medicine

## 2013-11-23 ENCOUNTER — Emergency Department (HOSPITAL_COMMUNITY)
Admission: EM | Admit: 2013-11-23 | Discharge: 2013-11-23 | Disposition: A | Discharge status: Home / Self Care | Payer: No Typology Code available for payment source | Attending: Emergency Medicine | Admitting: Emergency Medicine

## 2013-11-23 DIAGNOSIS — Z880 Allergy status to penicillin: Secondary | ICD-10-CM | POA: Insufficient documentation

## 2013-11-23 DIAGNOSIS — Z139 Encounter for screening, unspecified: Secondary | ICD-10-CM

## 2013-11-23 DIAGNOSIS — J45909 Unspecified asthma, uncomplicated: Secondary | ICD-10-CM | POA: Insufficient documentation

## 2013-11-23 DIAGNOSIS — Z720 Tobacco use: Secondary | ICD-10-CM | POA: Insufficient documentation

## 2013-11-23 DIAGNOSIS — Z8619 Personal history of other infectious and parasitic diseases: Secondary | ICD-10-CM | POA: Insufficient documentation

## 2013-11-23 DIAGNOSIS — Z0000 Encounter for general adult medical examination without abnormal findings: Secondary | ICD-10-CM | POA: Insufficient documentation

## 2013-11-23 DIAGNOSIS — Z8719 Personal history of other diseases of the digestive system: Secondary | ICD-10-CM | POA: Insufficient documentation

## 2013-11-23 DIAGNOSIS — Z7982 Long term (current) use of aspirin: Secondary | ICD-10-CM | POA: Insufficient documentation

## 2013-11-23 NOTE — Discharge Instructions (Signed)
Wash hands frequently and thoroughly after going to the bathroom.    Do not hesitate to return to the emergency room for any new, worsening or concerning symptoms.  Please obtain primary care using resource guide below. But the minute you were seen in the emergency room and that they will need to obtain records for further outpatient management.   Emergency Department Resource Guide 1) Find a Doctor and Pay Out of Pocket Although you won't have to find out who is covered by your insurance plan, it is a good idea to ask around and get recommendations. You will then need to call the office and see if the doctor you have chosen will accept you as a new patient and what types of options they offer for patients who are self-pay. Some doctors offer discounts or will set up payment plans for their patients who do not have insurance, but you will need to ask so you aren't surprised when you get to your appointment.  2) Contact Your Local Health Department Not all health departments have doctors that can see patients for sick visits, but many do, so it is worth a call to see if yours does. If you don't know where your local health department is, you can check in your phone book. The CDC also has a tool to help you locate your state's health department, and many state websites also have listings of all of their local health departments.  3) Find a Walk-in Clinic If your illness is not likely to be very severe or complicated, you may want to try a walk in clinic. These are popping up all over the country in pharmacies, drugstores, and shopping centers. They're usually staffed by nurse practitioners or physician assistants that have been trained to treat common illnesses and complaints. They're usually fairly quick and inexpensive. However, if you have serious medical issues or chronic medical problems, these are probably not your best option.  No Primary Care Doctor: - Call Health Connect at  [phone] - they  can help you locate a primary care doctor that  accepts your insurance, provides certain services, etc. - Physician Referral Service- [phone]  Chronic Pain Problems: Organization         Address  Phone   Notes  [facility]  [phone] Patients need to be referred by their primary care doctor.   Medication Assistance: Organization         Address  Phone   Notes  [facility] Medication [facility] [address] [place], [place] [address] [phone] --Must be a resident of [facility] [place] -- Must have NO insurance coverage whatsoever (no Medicaid/ Medicare, etc.) -- The pt. MUST have a primary care doctor that directs their care regularly and follows them in the community   MedAssist  [phone]   [employer]  [phone]    Agencies that provide inexpensive medical care: Organization         Address  Phone   Notes  [facility]  [phone]   [facility]    [phone]   [facility] [address] [place], [place] [address] [phone]   [facility] [address], [place] [phone]   [facility]    [phone]   [facility]    [phone]   Community [facility] and [facility]  [address], [place] Phone:  [phone], Fax:  [phone] Hours of Operation:  9 am - 6 pm, M-F.  Also accepts Medicaid/Medicare and self-pay.  [facility] for [address] [place], [address], [place] Phone: [phone], Fax: [phone]. Hours of Operation:  8:30 am - 5:30 pm, M-F.  Also accepts Medicaid and self-pay.  [facility] [place] [address], [place] Phone: [phone]   [place], [place], [place] [phone], Ext. [phone] Mondays & Thursdays: 7-9 AM.  First 15 patients are seen on a first come, first serve basis.    [place] Providers:  Organization         Address  Phone   Notes  [facility] [address], [place] [phone] Also accepts self-pay patients.  [facility] [address], [place]  [phone]   [place], [address], [place] [phone]   [facility] [address], [place] [phone]   Lucianne Lei [address], [place]   [phone] Only accepts [place] Access [place] patients after they have their name applied to their card.   Self-Pay (no insurance) in [facility]:  Organization         Address  Phone   Notes  Sickle Cell Patients, [facility] [place] [phone]   [facility] [place] [phone]   [facility] [place]  [place], [place], [place] [phone]   [facility]/Dr. Osei-Bonsu  [address] [place] or [address], [place] [phone] Phone number for both [place] and [place] locations is the same.  Urgent Medical and [facility] [address] [place] [phone]   [facility] [address] [place] or [address] [phone]   [facility] [address] [place] [phone], phone; [phone], fax Sees patients 1st and 3rd Saturday of every month.  Must not qualify for public or private insurance (i.e. Medicaid, Medicare, [place] Health Choice, Veterans' Benefits)  Household income should be no more than 200% of the poverty level The clinic cannot treat you if you are pregnant or think you are pregnant  Sexually transmitted diseases are not treated at the clinic.    Dental Care: Organization         Address  Phone  Notes  [facility] Department of [facility] [place] [phone] Accepts children up to age 63 who are enrolled in [place] or [place]; pregnant women with a Medicaid card; and children who have applied for Medicaid or [place] Health Choice, but were declined, whose parents can pay a reduced fee at time of service.  [facility] Department of [facility]  [address], [place] [phone] Accepts children up to age 84 who are enrolled in [place] or [place]; pregnant women with a Medicaid card; and children who have applied for Medicaid or [place] Health Choice, but were declined, whose parents can pay a reduced fee at time of service.  [facility] Access PRO[facility]  [place] [phone] Patients are seen by appointment only. Walk-ins are  not accepted. [place] will see patients 55 years of age and older. Monday - Tuesday (8am-5pm) Most Wednesdays (8:30-5pm) $30 per visit, cash only  [facility] Adult [facility] Access PROGRAM  [address], [facility] [phone] Patients are seen by appointment only. Walk-ins are not accepted. [place] will see patients 7 years of age and older. One Wednesday Evening (Monthly: Volunteer Based).  $30 per visit, cash only  [place]  [phone] for adults; Children under age 7, call Graduate Pediatric Dentistry at [phone]. Children aged 33-14, please call [phone] to request a pediatric application.  Dental services are provided in all areas of dental care including fillings, crowns and bridges, complete and partial dentures, implants, gum treatment, root canals, and extractions. Preventive care is also provided. Treatment is provided to both adults and children. Patients are selected via a lottery and there is often a waiting list.   [facility] [address] [place]  [phone] [email or web address]   [facility]  [facility] [address] [place], [place] [phone], Ext. [phone] Second and Fourth Thursday of each month, opens at 6:30 AM; Clinic ends at 9 AM.  Patients are seen on a first-come first-served basis, and a limited number are seen during each clinic.   [facility]  [address] Hillard Danker [place], [place] [phone]   Eligibility Requirements You must have lived in [place], [place], or [place] counties for at least the last three months.   You cannot be eligible for state or federal sponsored [employer], including [employer], [place], or [employer].   You generally cannot be eligible for healthcare insurance through your employer.    How to apply: Eligibility screenings are held every Tuesday and Wednesday afternoon from 1:00 pm until 4:00 pm. You do not need an appointment for the interview!  [facility] [address] [place], [place]   [place]  [facility] Department  [place]  [phone]    Behavioral Health Resources in the Community: Intensive Outpatient Programs Organization         Address  Phone  Notes  [facility]. [address], [place], [place] [phone]   [facility] Outpatient [address] [place], [place]   ADS: Alcohol & Drug Svcs [address] [place], [place]   [place] [address]  [place], [place] or [phone]   Substance Abuse Resources Organization         Address  Phone  Notes  Alcohol and Drug Services  [phone]   [place]  [phone]   [facility] [place]   Chinita Pester  [phone]   Residential & Outpatient Substance Abuse Program  [phone]   Psychological Services Organization         Address  Phone  Notes  [facility] [place]   [place]  [phone]   [place] [address] [place] [phone] or [phone]    Mobile Crisis Teams Organization         Address  Phone  Notes  [facility], Mobile Crisis Care Unit  [phone]   [facility] Psychotherapeutic Services  [address] [place], [place]   [facility] [address] [place] [phone]    Self-Help/Support  Groups Organization         Address  Phone             Notes  [facility]ental [facility] Assoc[facility] of [place] - variety of support groups  [place] Call for more information  Narcotics Anonymous (NA), [facility] [address], [employer] [place]  2 meetings at this location   Special educational needs teacher         Address  Phone  Notes  [facility]AP Residential Treatment [place],    [place]  [phone]   [facility]  [address], [place] [phone], [place], [place]   [place], [place] [phone] Admissions: 8am-3pm M-F  Incentives Substance [facility] [place] [address],    [place], [place] [phone]   [facility] [address] [place], [place], [place]   [facility] [address]  [place], [place]   [facility] - Intensive Outpatient [address] Kristeen Mans [address], [place], [place]   [facility] [address]  [place], [place] [phone] or [phone]   [facility] Services (RTS) [address] [place], [place] Accepts Medicaid  [facility] [place] [address]  [place] [phone] Substance Abuse/Addiction Treatment   [facility] Organization         Address  Phone  Notes  [facility]  [phone]   Domenic Schwab, PhD [address] Arlis Porta [place], [place]   [phone] or  [phone]   [place], [place] [phone]   [facility] [address] [place], [place] [phone] Insurance/Medicaid/sponsorship through [facility] [place] and Families [address], [address] [place]                                    [place], [place] [phone] [place] [address], [place] [phone]    Dr. Adele Schilder  [phone]   [facility] Dept. 1) [address] [place] 2) [place] 3)  [place] [address], [place] [phone]  [phone]   [place] [phone] or [phone] (After Hours)

## 2013-11-23 NOTE — ED Provider Notes (Signed)
CSN: [id number]     Arrival date & time 11/23/13  1326 History   First MD Initiated Contact with Patient 11/23/13 1350    CC: Need work note  (Consider location/radiation/quality/duration/timing/severity/associated sxs/prior Treatment) HPI   Sherri Reid is a 38 y.o. female requesting paperwork be filled out to return to work. 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 She had what appears to be a gastroenteritis with nausea vomiting abdominal pain. Patient states that all of the symptoms are resolved and have not been an issue for over 24 hours. She denies abdominal pain, fever. She is requesting paperwork specific for her job to be filled out.  Past Medical History  Diagnosis Date  . Asthma   . Syphilis     treated   Past Surgical History  Procedure Laterality Date  . No past surgeries     Family History  Problem Relation Age of Onset  . Anesthesia problems Neg Hx    History  Substance Use Topics  . Smoking status: Current Every Day Smoker -- 0.50 packs/day    Types: Cigarettes  . Smokeless tobacco: Not on file  . Alcohol Use: Yes     Comment: occasionally   OB History    Gravida Para Term Preterm AB TAB SAB Ectopic Multiple Living   1 0 0 0 1 0 1 0 0 0      Review of Systems  10 systems reviewed and found to be negative, except as noted in the HPI.   Allergies  Penicillins  Home Medications   Prior to Admission medications   Medication Sig Start Date End Date Taking? Authorizing Provider  aspirin EC 325 MG tablet Take 650 mg by mouth 2 (two) times daily as needed (for pain).    Historical Provider, MD  promethazine (PHENERGAN) 25 MG tablet Take 1 tablet (25 mg total) by mouth every 6 (six) hours as needed for nausea or vomiting. 11/21/13   Junius FinnerErin O'Malley, PA-C   BP 158/91 mmHg  Pulse 67  Temp(Src) 97.8 F (36.6 C) (Oral)  Resp 18  SpO2 100% Physical Exam  Constitutional: She is oriented to person, place, and time. She appears well-developed and well-nourished. No  distress.  HENT:  Head: Normocephalic and atraumatic.  Mouth/Throat: Oropharynx is clear and moist.  Eyes: Conjunctivae and EOM are normal. Pupils are equal, round, and reactive to light.  Neck: Normal range of motion.  Cardiovascular: Normal rate and regular rhythm.   Pulmonary/Chest: Effort normal and breath sounds normal. No stridor. No respiratory distress. She has no wheezes. She has no rales. She exhibits no tenderness.  Abdominal: Soft. Bowel sounds are normal. She exhibits no distension and no mass. There is no tenderness. There is no rebound and no guarding.  Musculoskeletal: Normal range of motion.  Neurological: She is alert and oriented to person, place, and time.  Psychiatric: She has a normal mood and affect.  Nursing note and vitals reviewed.   ED Course  Procedures (including critical care time) Labs Review Labs Reviewed - No data to display  Imaging Review No results found.   EKG Interpretation None      MDM   Final diagnoses:  Encounter for medical screening examination    Filed Vitals:   11/23/13 1338  BP: 158/91  Pulse: 67  Temp: 97.8 F (36.6 C)  TempSrc: Oral  Resp: 18  SpO2: 100%    Sherri Reid is a 38 y.o. female presenting with Resolved gastroenteritis and requesting paperwork be filled out for work, this paperwork is  extensive and not the scope of the ED. I've advised her that she will need to have a primary care physician fill this out.  Evaluation does not show pathology that would require ongoing emergent intervention or inpatient treatment. Pt is hemodynamically stable and mentating appropriately. Discussed findings and plan with patient/guardian, who agrees with care plan. All questions answered. Return precautions discussed and outpatient follow up given.       Wynetta Emeryicole Yarnell Kozloski, PA-C 11/23/13 1711  Samuel JesterKathleen McManus, DO 11/25/13 1609

## 2013-11-23 NOTE — ED Notes (Signed)
Pt requests paperwork be filled out for work to return to work.

## 2013-11-25 ENCOUNTER — Ambulatory Visit (INDEPENDENT_AMBULATORY_CARE_PROVIDER_SITE_OTHER): Payer: Self-pay | Admitting: Emergency Medicine

## 2013-11-25 VITALS — BP 140/90 | HR 72 | Temp 97.6°F | Resp 18 | Ht 62.0 in | Wt 150.4 lb

## 2013-11-25 DIAGNOSIS — Z8719 Personal history of other diseases of the digestive system: Secondary | ICD-10-CM

## 2013-11-25 NOTE — Progress Notes (Signed)
[facility] [address], [place] [id number] [phone]  Date:  11/25/2013   Name:  Sherri AlarSamantha Reid   DOB:  07/25/1975   MRN:  [id number]  PCP:  Ernst BreachYSINGER, DAVID SHANE, PA-C    Chief Complaint: RTW FORM   History of Present Illness:  Sherri Reid is a 38 y.o. very pleasant female patient who presents with the following:  Patient seen in the ER on 11/16 and discharged with treatment for gastroenteritis and abdominal pain Symptoms have resolved. Patient is asymptomatic. Requests return to work Denies other complaint or health concern today.   There are no active problems to display for this patient.   Past Medical History  Diagnosis Date  . Asthma   . Syphilis     treated    Past Surgical History  Procedure Laterality Date  . No past surgeries      History  Substance Use Topics  . Smoking status: Current Every Day Smoker -- 0.50 packs/day    Types: Cigarettes  . Smokeless tobacco: Not on file  . Alcohol Use: Yes     Comment: occasionally    Family History  Problem Relation Age of Onset  . Anesthesia problems Neg Hx   . Hyperlipidemia Mother   . Mental illness Mother   . Hypertension Mother   . Diabetes Father     Allergies  Allergen Reactions  . Penicillins Hives and Swelling    Medication list has been reviewed and updated.  Current Outpatient Prescriptions on File Prior to Visit  Medication Sig Dispense Refill  . aspirin EC 325 MG tablet Take 650 mg by mouth 2 (two) times daily as needed (for pain).    . promethazine (PHENERGAN) 25 MG tablet Take 1 tablet (25 mg total) by mouth every 6 (six) hours as needed for nausea or vomiting. 10 tablet 0   No current facility-administered medications on file prior to visit.    Review of Systems:  As per HPI, otherwise negative.    Physical Examination: Filed Vitals:   11/25/13 1025  BP: 140/90  Pulse: 72  Temp: 97.6 F (36.4 C)  Resp: 18   Filed Vitals:   11/25/13  1025  Height: 5\' 2"  (1.575 m)  Weight: 150 lb 6.4 oz (68.221 kg)   Body mass index is 27.5 kg/(m^2). Ideal Body Weight: Weight in (lb) to have BMI = 25: 136.4   GEN: WDWN, NAD, Non-toxic, Alert & Oriented x 3 HEENT: Atraumatic, Normocephalic.  Ears and Nose: No external deformity. EXTR: No clubbing/cyanosis/edema NEURO: Normal gait.  PSYCH: Normally interactive. Conversant. Not depressed or anxious appearing.  Calm demeanor.    Assessment and Plan: Resolved gastroenteritis  Signed,  Phillips OdorJeffery Sophiagrace Benbrook, MD

## 2014-03-09 ENCOUNTER — Encounter (HOSPITAL_COMMUNITY): Payer: Self-pay | Admitting: *Deleted

## 2014-03-09 ENCOUNTER — Inpatient Hospital Stay (HOSPITAL_COMMUNITY)
Admission: AD | Admit: 2014-03-09 | Discharge: 2014-03-09 | Disposition: A | Discharge status: Home / Self Care | Payer: Self-pay | Source: Ambulatory Visit | Attending: Obstetrics & Gynecology | Admitting: Obstetrics & Gynecology

## 2014-03-09 ENCOUNTER — Inpatient Hospital Stay (HOSPITAL_COMMUNITY): Payer: Self-pay

## 2014-03-09 DIAGNOSIS — R109 Unspecified abdominal pain: Secondary | ICD-10-CM | POA: Insufficient documentation

## 2014-03-09 DIAGNOSIS — Z720 Tobacco use: Secondary | ICD-10-CM | POA: Insufficient documentation

## 2014-03-09 DIAGNOSIS — R102 Pelvic and perineal pain: Secondary | ICD-10-CM

## 2014-03-09 HISTORY — DX: Unspecified abnormal cytological findings in specimens from vagina: R87.629

## 2014-03-09 LAB — URINALYSIS, ROUTINE W REFLEX MICROSCOPIC
BILIRUBIN URINE: NEGATIVE
Glucose, UA: NEGATIVE mg/dL
Hgb urine dipstick: NEGATIVE
KETONES UR: NEGATIVE mg/dL
Leukocytes, UA: NEGATIVE
NITRITE: NEGATIVE
PH: 6 (ref 5.0–8.0)
Protein, ur: NEGATIVE mg/dL
Specific Gravity, Urine: 1.02 (ref 1.005–1.030)
Urobilinogen, UA: 0.2 mg/dL (ref 0.0–1.0)

## 2014-03-09 LAB — CBC
HCT: 37.8 % (ref 36.0–46.0)
Hemoglobin: 12.4 g/dL (ref 12.0–15.0)
MCH: 26.6 pg (ref 26.0–34.0)
MCHC: 32.8 g/dL (ref 30.0–36.0)
MCV: 81.1 fL (ref 78.0–100.0)
PLATELETS: 282 10*3/uL (ref 150–400)
RBC: 4.66 MIL/uL (ref 3.87–5.11)
RDW: 13.9 % (ref 11.5–15.5)
WBC: 7.3 10*3/uL (ref 4.0–10.5)

## 2014-03-09 LAB — GC/CHLAMYDIA PROBE AMP (~~LOC~~) NOT AT ARMC
Chlamydia: NEGATIVE
Neisseria Gonorrhea: NEGATIVE

## 2014-03-09 LAB — WET PREP, GENITAL
Clue Cells Wet Prep HPF POC: NONE SEEN
TRICH WET PREP: NONE SEEN
Yeast Wet Prep HPF POC: NONE SEEN

## 2014-03-09 LAB — POCT PREGNANCY, URINE: Preg Test, Ur: NEGATIVE

## 2014-03-09 MED ORDER — IBUPROFEN 800 MG PO TABS: 800.0000 mg | ORAL_TABLET | Freq: Three times a day (TID) | ORAL | Status: DC

## 2014-03-09 MED ORDER — KETOROLAC TROMETHAMINE 60 MG/2ML IM SOLN
60.0000 mg | Freq: Once | INTRAMUSCULAR | Status: AC
  Administered 2014-03-09: 60 mg via INTRAMUSCULAR
  Filled 2014-03-09: qty 2

## 2014-03-09 NOTE — MAU Note (Addendum)
Past 3 months, has been having pain in lower abd. worse after she urinates.  Also been having a vaginal odor. Sex has been painful

## 2014-03-09 NOTE — Discharge Instructions (Signed)
Abdominal Pain, Women °Abdominal (stomach, pelvic, or belly) pain can be caused by many things. It is important to tell your doctor: °· The location of the pain. °· Does it come and go or is it present all the time? °· Are there things that start the pain (eating certain foods, exercise)? °· Are there other symptoms associated with the pain (fever, nausea, vomiting, diarrhea)? °All of this is helpful to know when trying to find the cause of the pain. °CAUSES  °· Stomach: virus or bacteria infection, or ulcer. °· Intestine: appendicitis (inflamed appendix), regional ileitis (Crohn's disease), ulcerative colitis (inflamed colon), irritable bowel syndrome, diverticulitis (inflamed diverticulum of the colon), or cancer of the stomach or intestine. °· Gallbladder disease or stones in the gallbladder. °· Kidney disease, kidney stones, or infection. °· Pancreas infection or cancer. °· Fibromyalgia (pain disorder). °· Diseases of the female organs: °¨ Uterus: fibroid (non-cancerous) tumors or infection. °¨ Fallopian tubes: infection or tubal pregnancy. °¨ Ovary: cysts or tumors. °¨ Pelvic adhesions (scar tissue). °¨ Endometriosis (uterus lining tissue growing in the pelvis and on the pelvic organs). °¨ Pelvic congestion syndrome (female organs filling up with blood just before the menstrual period). °¨ Pain with the menstrual period. °¨ Pain with ovulation (producing an egg). °¨ Pain with an IUD (intrauterine device, birth control) in the uterus. °¨ Cancer of the female organs. °· Functional pain (pain not caused by a disease, may improve without treatment). °· Psychological pain. °· Depression. °DIAGNOSIS  °Your doctor will decide the seriousness of your pain by doing an examination. °· Blood tests. °· X-rays. °· Ultrasound. °· CT scan (computed tomography, special type of X-ray). °· MRI (magnetic resonance imaging). °· Cultures, for infection. °· Barium enema (dye inserted in the large intestine, to better view it with  X-rays). °· Colonoscopy (looking in intestine with a lighted tube). °· Laparoscopy (minor surgery, looking in abdomen with a lighted tube). °· Major abdominal exploratory surgery (looking in abdomen with a large incision). °TREATMENT  °The treatment will depend on the cause of the pain.  °· Many cases can be observed and treated at home. °· Over-the-counter medicines recommended by your caregiver. °· Prescription medicine. °· Antibiotics, for infection. °· Birth control pills, for painful periods or for ovulation pain. °· Hormone treatment, for endometriosis. °· Nerve blocking injections. °· Physical therapy. °· Antidepressants. °· Counseling with a psychologist or psychiatrist. °· Minor or major surgery. °HOME CARE INSTRUCTIONS  °· Do not take laxatives, unless directed by your caregiver. °· Take over-the-counter pain medicine only if ordered by your caregiver. Do not take aspirin because it can cause an upset stomach or bleeding. °· Try a clear liquid diet (broth or water) as ordered by your caregiver. Slowly move to a bland diet, as tolerated, if the pain is related to the stomach or intestine. °· Have a thermometer and take your temperature several times a day, and record it. °· Bed rest and sleep, if it helps the pain. °· Avoid sexual intercourse, if it causes pain. °· Avoid stressful situations. °· Keep your follow-up appointments and tests, as your caregiver orders. °· If the pain does not go away with medicine or surgery, you may try: °¨ Acupuncture. °¨ Relaxation exercises (yoga, meditation). °¨ Group therapy. °¨ Counseling. °SEEK MEDICAL CARE IF:  °· You notice certain foods cause stomach pain. °· Your home care treatment is not helping your pain. °· You need stronger pain medicine. °· You want your IUD removed. °· You feel faint or   lightheaded. °· You develop nausea and vomiting. °· You develop a rash. °· You are having side effects or an allergy to your medicine. °SEEK IMMEDIATE MEDICAL CARE IF:  °· Your  pain does not go away or gets worse. °· You have a fever. °· Your pain is felt only in portions of the abdomen. The right side could possibly be appendicitis. The left lower portion of the abdomen could be colitis or diverticulitis. °· You are passing blood in your stools (bright red or black tarry stools, with or without vomiting). °· You have blood in your urine. °· You develop chills, with or without a fever. °· You pass out. °MAKE SURE YOU:  °· Understand these instructions. °· Will watch your condition. °· Will get help right away if you are not doing well or get worse. °Document Released: 10/20/2006 Document Revised: 05/09/2013 Document Reviewed: 11/09/2008 °ExitCare® Patient Information ©2015 [employer]. This information is not intended to replace advice given to you by your health care provider. Make sure you discuss any questions you have with your health care provider. ° °

## 2014-03-09 NOTE — MAU Provider Note (Signed)
History     CSN: [id number]  Arrival date and time: 03/09/14 0900   None     Chief Complaint  Patient presents with  . Abdominal Pain   HPI 39 y.o. G1P0010 with low abd pain, which is daily and intermittent, x 3 mos. Pain increases w/ intercourse and urination. No alleviating factors noted.   Past Medical History  Diagnosis Date  . Asthma   . Syphilis     treated  . Infection     UTI  . Vaginal Pap smear, abnormal     f/u ok    Past Surgical History  Procedure Laterality Date  . No past surgeries      Family History  Problem Relation Age of Onset  . Anesthesia problems Neg Hx   . Hyperlipidemia Mother   . Mental illness Mother   . Hypertension Mother   . Diabetes Father   . Cancer Maternal Aunt   . Cancer Paternal Uncle     History  Substance Use Topics  . Smoking status: Current Every Day Smoker -- 1.00 packs/day for 22 years    Types: Cigarettes  . Smokeless tobacco: Never Used  . Alcohol Use: Yes     Comment: occasionally    Allergies:  Allergies  Allergen Reactions  . Penicillins Hives and Swelling    Prescriptions prior to admission  Medication Sig Dispense Refill Last Dose  . aspirin EC 325 MG tablet Take 650 mg by mouth 2 (two) times daily as needed (for pain).   Past Week at Unknown time  . promethazine (PHENERGAN) 25 MG tablet Take 1 tablet (25 mg total) by mouth every 6 (six) hours as needed for nausea or vomiting. (Patient not taking: Reported on 03/09/2014) 10 tablet 0 Not Taking    Review of Systems  Constitutional: Negative.   Respiratory: Negative.   Cardiovascular: Negative.   Gastrointestinal: Positive for abdominal pain. Negative for nausea, vomiting, diarrhea and constipation.  Genitourinary: Positive for dysuria. Negative for urgency, frequency, hematuria and flank pain.       + vaginal discharge & dyspareunia    Musculoskeletal: Negative.   Neurological: Negative.   Psychiatric/Behavioral: Negative.    Physical Exam    Blood pressure 142/83, pulse 93, temperature 98.1 F (36.7 C), temperature source Oral, resp. rate 18, weight 265 lb (120.203 kg), last menstrual period 02/13/2014.  Physical Exam  Constitutional: She is oriented to person, place, and time. She appears well-developed. No distress.  Obese   Cardiovascular: Normal rate.   Respiratory: Effort normal.  GI: Soft. There is no tenderness.  Genitourinary: There is no rash, tenderness or lesion on the right labia. There is no rash, tenderness or lesion on the left labia. Uterus is not enlarged and not tender. Cervix exhibits no motion tenderness and no friability. Right adnexum displays tenderness. Right adnexum displays no mass. Left adnexum displays no mass and no tenderness. No erythema or bleeding in the vagina. No vaginal discharge found.  Musculoskeletal: Normal range of motion.  Lymphadenopathy:       Right: No inguinal adenopathy present.       Left: No inguinal adenopathy present.  Neurological: She is alert and oriented to person, place, and time.  Skin: Skin is warm and dry.  Psychiatric: She has a normal mood and affect.    MAU Course  Procedures  Results for orders placed or performed during the hospital encounter of 03/09/14 (from the past 24 hour(s))  Urinalysis, Routine w reflex microscopic  Status: None   Collection Time: 03/09/14  9:10 AM  Result Value Ref Range   Color, Urine YELLOW YELLOW   APPearance CLEAR CLEAR   Specific Gravity, Urine 1.020 1.005 - 1.030   pH 6.0 5.0 - 8.0   Glucose, UA NEGATIVE NEGATIVE mg/dL   Hgb urine dipstick NEGATIVE NEGATIVE   Bilirubin Urine NEGATIVE NEGATIVE   Ketones, ur NEGATIVE NEGATIVE mg/dL   Protein, ur NEGATIVE NEGATIVE mg/dL   Urobilinogen, UA 0.2 0.0 - 1.0 mg/dL   Nitrite NEGATIVE NEGATIVE   Leukocytes, UA NEGATIVE NEGATIVE  Pregnancy, urine POC     Status: None   Collection Time: 03/09/14  9:20 AM  Result Value Ref Range   Preg Test, Ur NEGATIVE NEGATIVE  Wet prep,  genital     Status: Abnormal   Collection Time: 03/09/14  9:25 AM  Result Value Ref Range   Yeast Wet Prep HPF POC NONE SEEN NONE SEEN   Trich, Wet Prep NONE SEEN NONE SEEN   Clue Cells Wet Prep HPF POC NONE SEEN NONE SEEN   WBC, Wet Prep HPF POC FEW (A) NONE SEEN  CBC     Status: None   Collection Time: 03/09/14  9:25 AM  Result Value Ref Range   WBC 7.3 4.0 - 10.5 K/uL   RBC 4.66 3.87 - 5.11 MIL/uL   Hemoglobin 12.4 12.0 - 15.0 g/dL   HCT 78.437.8 69.636.0 - 29.546.0 %   MCV 81.1 78.0 - 100.0 fL   MCH 26.6 26.0 - 34.0 pg   MCHC 32.8 30.0 - 36.0 g/dL   RDW 28.413.9 13.211.5 - 44.015.5 %   Platelets 282 150 - 400 K/uL   [place] Transvaginal Non-ob  03/09/2014   CLINICAL DATA:  Pelvic pain in female. Right lower quadrant pain for 3 months. LMP 02/13/2014.  EXAM: TRANSABDOMINAL AND TRANSVAGINAL ULTRASOUND OF PELVIS  TECHNIQUE: Both transabdominal and transvaginal ultrasound examinations of the pelvis were performed. Transabdominal technique was performed for global imaging of the pelvis including uterus, ovaries, adnexal regions, and pelvic cul-de-sac. It was necessary to proceed with endovaginal exam following the transabdominal exam to visualize the endometrial stripe and ovaries.  COMPARISON:  03/26/2010  FINDINGS: Uterus  Measurements: 6.8 x 3.3 x 4.8 cm. No fibroids or other mass visualized.  Endometrium  Thickness: 8 mm.  No focal abnormality visualized.  Right ovary  Measurements: 3.2 by 2.2 x 2.2 cm. Small 1.4 cm corpus luteum noted. Otherwise normal appearance/no adnexal mass.  Left ovary  Measurements: 2.5 x 1.5 x 1.6 cm. Normal appearance/no adnexal mass.  Other findings  No free fluid.  IMPRESSION: Normal appearance of uterus and both ovaries. No pelvic mass or other sonographic abnormality identified.   Electronically Signed   By: Myles RosenthalJohn  Stahl M.D.   On: 03/09/2014 10:52   [place] Pelvis Complete  03/09/2014   CLINICAL DATA:  Pelvic pain in female. Right lower quadrant pain for 3 months. LMP 02/13/2014.  EXAM:  TRANSABDOMINAL AND TRANSVAGINAL ULTRASOUND OF PELVIS  TECHNIQUE: Both transabdominal and transvaginal ultrasound examinations of the pelvis were performed. Transabdominal technique was performed for global imaging of the pelvis including uterus, ovaries, adnexal regions, and pelvic cul-de-sac. It was necessary to proceed with endovaginal exam following the transabdominal exam to visualize the endometrial stripe and ovaries.  COMPARISON:  03/26/2010  FINDINGS: Uterus  Measurements: 6.8 x 3.3 x 4.8 cm. No fibroids or other mass visualized.  Endometrium  Thickness: 8 mm.  No focal abnormality visualized.  Right ovary  Measurements: 3.2  by 2.2 x 2.2 cm. Small 1.4 cm corpus luteum noted. Otherwise normal appearance/no adnexal mass.  Left ovary  Measurements: 2.5 x 1.5 x 1.6 cm. Normal appearance/no adnexal mass.  Other findings  No free fluid.  IMPRESSION: Normal appearance of uterus and both ovaries. No pelvic mass or other sonographic abnormality identified.   Electronically Signed   By: Myles Rosenthal M.D.   On: 03/09/2014 10:52     Assessment and Plan  39 y.o. G1P0010 with low abd pain and significant R adnexal tenderness on exam UA, CBC, wet prep negative Pelvic u/s pending Care assumed by Jannifer Rodney, NP Toradol was helpful  A: Abdominal pain  P: Reviewed all labs and ultrasound Motrin 800 mg PO TID prn Advised to find PCP for evaluation of pain Return to MAU as needed   FRAZIER,NATALIE 03/09/2014, 10:21 AM

## 2014-03-10 LAB — HIV ANTIBODY (ROUTINE TESTING W REFLEX): HIV SCREEN 4TH GENERATION: NONREACTIVE

## 2014-03-13 ENCOUNTER — Emergency Department (HOSPITAL_COMMUNITY): Payer: Self-pay

## 2014-03-13 ENCOUNTER — Emergency Department (HOSPITAL_COMMUNITY)
Admission: EM | Admit: 2014-03-13 | Discharge: 2014-03-13 | Disposition: A | Discharge status: Home / Self Care | Payer: Self-pay | Attending: Emergency Medicine | Admitting: Emergency Medicine

## 2014-03-13 ENCOUNTER — Encounter (HOSPITAL_COMMUNITY): Payer: Self-pay | Admitting: Emergency Medicine

## 2014-03-13 DIAGNOSIS — Z87440 Personal history of urinary (tract) infections: Secondary | ICD-10-CM | POA: Insufficient documentation

## 2014-03-13 DIAGNOSIS — J159 Unspecified bacterial pneumonia: Secondary | ICD-10-CM | POA: Insufficient documentation

## 2014-03-13 DIAGNOSIS — Z7982 Long term (current) use of aspirin: Secondary | ICD-10-CM | POA: Insufficient documentation

## 2014-03-13 DIAGNOSIS — J189 Pneumonia, unspecified organism: Secondary | ICD-10-CM

## 2014-03-13 DIAGNOSIS — Z880 Allergy status to penicillin: Secondary | ICD-10-CM | POA: Insufficient documentation

## 2014-03-13 DIAGNOSIS — Z720 Tobacco use: Secondary | ICD-10-CM | POA: Insufficient documentation

## 2014-03-13 DIAGNOSIS — Z79899 Other long term (current) drug therapy: Secondary | ICD-10-CM | POA: Insufficient documentation

## 2014-03-13 DIAGNOSIS — J45901 Unspecified asthma with (acute) exacerbation: Secondary | ICD-10-CM | POA: Insufficient documentation

## 2014-03-13 DIAGNOSIS — Z8619 Personal history of other infectious and parasitic diseases: Secondary | ICD-10-CM | POA: Insufficient documentation

## 2014-03-13 MED ORDER — PREDNISONE 20 MG PO TABS: 60.0000 mg | ORAL_TABLET | Freq: Every day | ORAL | Status: DC

## 2014-03-13 MED ORDER — ALBUTEROL SULFATE HFA 108 (90 BASE) MCG/ACT IN AERS
2.0000 | INHALATION_SPRAY | RESPIRATORY_TRACT | Status: DC | PRN
  Administered 2014-03-13: 2 via RESPIRATORY_TRACT
  Filled 2014-03-13: qty 6.7

## 2014-03-13 MED ORDER — ALBUTEROL SULFATE HFA 108 (90 BASE) MCG/ACT IN AERS: 2.0000 | INHALATION_SPRAY | RESPIRATORY_TRACT | Status: DC | PRN

## 2014-03-13 MED ORDER — IPRATROPIUM-ALBUTEROL 0.5-2.5 (3) MG/3ML IN SOLN
3.0000 mL | Freq: Once | RESPIRATORY_TRACT | Status: AC
  Administered 2014-03-13: 3 mL via RESPIRATORY_TRACT

## 2014-03-13 MED ORDER — IPRATROPIUM-ALBUTEROL 0.5-2.5 (3) MG/3ML IN SOLN
3.0000 mL | Freq: Once | RESPIRATORY_TRACT | Status: AC
  Administered 2014-03-13: 3 mL via RESPIRATORY_TRACT
  Filled 2014-03-13: qty 3

## 2014-03-13 MED ORDER — PREDNISONE 20 MG PO TABS
60.0000 mg | ORAL_TABLET | Freq: Once | ORAL | Status: AC
  Administered 2014-03-13: 60 mg via ORAL
  Filled 2014-03-13: qty 3

## 2014-03-13 MED ORDER — AZITHROMYCIN 250 MG PO TABS: ORAL_TABLET | ORAL | Status: DC

## 2014-03-13 MED ORDER — IPRATROPIUM BROMIDE HFA 17 MCG/ACT IN AERS
2.0000 | INHALATION_SPRAY | Freq: Once | RESPIRATORY_TRACT | Status: DC
  Filled 2014-03-13: qty 12.9

## 2014-03-13 MED ORDER — IPRATROPIUM-ALBUTEROL 0.5-2.5 (3) MG/3ML IN SOLN
RESPIRATORY_TRACT | Status: AC
  Administered 2014-03-13: 3 mL via RESPIRATORY_TRACT
  Filled 2014-03-13: qty 3

## 2014-03-13 NOTE — ED Provider Notes (Signed)
CSN: [id number]     Arrival date & time 03/13/14  0212 History   First MD Initiated Contact with Patient 03/13/14 0243     This chart was scribed for Shon Batonourtney F Yanessa Hocevar, MD by Arlan OrganAshley Leger, ED Scribe. This patient was seen in room A08C/A08C and the patient's care was started 2:56 AM.   Chief Complaint  Patient presents with  . Asthma  . Shortness of Breath   HPI  HPI Comments: Sherri Reid is a 39 y.o. female with a PMHx of asthma who presents to the Emergency Department complaining of constant, ongoing SOB x 2 days. Pt also reports cough and recent partially resolved cold symptoms. Pt has been using sons nebulizer with some relief for symptoms. Sherri Reid states she typically only has flare up after cold like symptoms. Pt with known allergy to Penicillins.  Past Medical History  Diagnosis Date  . Asthma   . Syphilis     treated  . Infection     UTI  . Vaginal Pap smear, abnormal     f/u ok   Past Surgical History  Procedure Laterality Date  . No past surgeries     Family History  Problem Relation Age of Onset  . Anesthesia problems Neg Hx   . Hyperlipidemia Mother   . Mental illness Mother   . Hypertension Mother   . Diabetes Father   . Cancer Maternal Aunt   . Cancer Paternal Uncle    History  Substance Use Topics  . Smoking status: Current Every Day Smoker -- 1.00 packs/day for 22 years    Types: Cigarettes  . Smokeless tobacco: Never Used  . Alcohol Use: Yes     Comment: occasionally   OB History    Gravida Para Term Preterm AB TAB SAB Ectopic Multiple Living   1 0 0 0 1 0 1 0 0 0      Review of Systems  Constitutional: Negative for fever and chills.  HENT: Positive for congestion.   Respiratory: Positive for cough, shortness of breath and wheezing. Negative for chest tightness.   Cardiovascular: Negative for chest pain.  Gastrointestinal: Negative for abdominal pain.  Genitourinary: Negative for dysuria.  Musculoskeletal: Negative for back pain.   Neurological: Negative for headaches.  All other systems reviewed and are negative.     Allergies  Penicillins  Home Medications   Prior to Admission medications   Medication Sig Start Date End Date Taking? Authorizing Provider  aspirin EC 325 MG tablet Take 650 mg by mouth 2 (two) times daily as needed (for pain).   Yes Historical Provider, MD  ibuprofen (ADVIL,MOTRIN) 800 MG tablet Take 1 tablet (800 mg total) by mouth 3 (three) times daily. Patient taking differently: Take 800 mg by mouth 3 (three) times daily as needed for moderate pain.  03/09/14  Yes Delbert PhenixLinda M Barefoot, NP  albuterol (PROVENTIL HFA;VENTOLIN HFA) 108 (90 BASE) MCG/ACT inhaler Inhale 2 puffs into the lungs every 4 (four) hours as needed for wheezing or shortness of breath. 03/13/14   Shon Batonourtney F Inis Borneman, MD  azithromycin (ZITHROMAX) 250 MG tablet Take first 2 tablets together, then 1 every day until finished. 03/13/14   Shon Batonourtney F Patrick Sohm, MD  predniSONE (DELTASONE) 20 MG tablet Take 3 tablets (60 mg total) by mouth daily with breakfast. 03/13/14   Shon Batonourtney F Varnell Donate, MD   Triage Vitals: BP 135/88 mmHg  Pulse 94  Temp(Src) 98.4 F (36.9 C) (Oral)  Resp 25  SpO2 98%  LMP 02/13/2014  Physical Exam  Constitutional: She is oriented to person, place, and time. She appears well-developed and well-nourished.  Morbidly obese  HENT:  Head: Normocephalic and atraumatic.  Cardiovascular: Normal rate, regular rhythm and normal heart sounds.   No murmur heard. Pulmonary/Chest: Effort normal. No respiratory distress. She has wheezes.  Diffuse expiratory wheezing, fair air movement  Abdominal: Soft. There is no tenderness.  Musculoskeletal: She exhibits no edema.  Neurological: She is alert and oriented to person, place, and time.  Skin: Skin is warm and dry.  Psychiatric: She has a normal mood and affect.  Nursing note and vitals reviewed.   ED Course  Procedures (including critical care time)  DIAGNOSTIC STUDIES: Oxygen  Saturation is 95% on RA, adequate by my interpretation.    COORDINATION OF CARE: 3:04 AM-Discussed treatment plan with pt at bedside and pt agreed to plan.     Labs Review Labs Reviewed - No data to display  Imaging Review Dg Chest 2 View (if Patient Has Fever And/or Copd)  03/13/2014   CLINICAL DATA:  Nausea and vomiting. Mid chest pain and cough for 3 days.  EXAM: CHEST  2 VIEW  COMPARISON:  02/20/2012  FINDINGS: Shallow inspiration. Normal heart size and pulmonary vascularity. New development of linear infiltration in the right upper lung anteriorly. This may represent pneumonia in the appropriate clinical setting. Atelectasis could also have this appearance. No blunting of costophrenic angles. No pneumothorax. Left lung is clear. Mediastinal contours appear intact.  IMPRESSION: Linear infiltration in the right upper lung. This could represent pneumonia in the appropriate clinical setting.   Electronically Signed   By: Burman Nieves M.D.   On: 03/13/2014 02:57     EKG Interpretation None      MDM   Final diagnoses:  Asthma exacerbation  Community acquired pneumonia   Patient presents with shortness of breath and wheezing. Also reports congestion and cough over the last several days. History of asthma. She is status post DuoNeb and she continues to wheeze with fair air movement. Suspect acute exacerbation. Patient given second duo neb and prednisone. Chest x-ray shows infiltrate of the right upper lung which may represent pneumonia. Given cough, will treat with azithromycin  4:05 AM On repeat exam, patient reports improvement of symptoms. Continues to have diffuse wheezing but has improved air movement. 98% on room air. Will ambulate with pulse ox. Given x-ray findings, we'll discharge with steroids, albuterol, and azithromycin.  Patient cannulated without difficulty and maintained pulse ox. She will be discharged with plan as above.  After history, exam, and medical workup I feel  the patient has been appropriately medically screened and is safe for discharge home. Pertinent diagnoses were discussed with the patient. Patient was given return precautions.  I personally performed the services described in this documentation, which was scribed in my presence. The recorded information has been reviewed and is accurate.   Shon Baton, MD 03/13/14 [phone]

## 2014-03-13 NOTE — ED Notes (Signed)
Pt SpO2 100% while ambulating.

## 2014-03-13 NOTE — ED Notes (Signed)
Patient here with SOB for 2 days. States has been using sons nebulizer with relief, but tonight no longer effective. States history of asthma. Patient tachypneic with wheezing audible from door. Productive cough with chills.

## 2014-03-13 NOTE — ED Notes (Signed)
Patient was coming to the room and xray

## 2014-03-13 NOTE — Discharge Instructions (Signed)
Pneumonia  Pneumonia is an infection of the lungs.   CAUSES  Pneumonia may be caused by bacteria or a virus. Usually, these infections are caused by breathing infectious particles into the lungs (respiratory tract).  SIGNS AND SYMPTOMS   · Cough.  · Fever.  · Chest pain.  · Increased rate of breathing.  · Wheezing.  · Mucus production.  DIAGNOSIS   If you have the common symptoms of pneumonia, your health care provider will typically confirm the diagnosis with a chest X-ray. The X-ray will show an abnormality in the lung (pulmonary infiltrate) if you have pneumonia. Other tests of your blood, urine, or sputum may be done to find the specific cause of your pneumonia. Your health care provider may also do tests (blood gases or pulse oximetry) to see how well your lungs are working.  TREATMENT   Some forms of pneumonia may be spread to other people when you cough or sneeze. You may be asked to wear a mask before and during your exam. Pneumonia that is caused by bacteria is treated with antibiotic medicine. Pneumonia that is caused by the influenza virus may be treated with an antiviral medicine. Most other viral infections must run their course. These infections will not respond to antibiotics.   HOME CARE INSTRUCTIONS   · Cough suppressants may be used if you are losing too much rest. However, coughing protects you by clearing your lungs. You should avoid using cough suppressants if you can.  · Your health care provider may have prescribed medicine if he or she thinks your pneumonia is caused by bacteria or influenza. Finish your medicine even if you start to feel better.  · Your health care provider may also prescribe an expectorant. This loosens the mucus to be coughed up.  · Take medicines only as directed by your health care provider.  · Do not smoke. Smoking is a common cause of bronchitis and can contribute to pneumonia. If you are a smoker and continue to smoke, your cough may last several weeks after your  pneumonia has cleared.  · A cold steam vaporizer or humidifier in your room or home may help loosen mucus.  · Coughing is often worse at night. Sleeping in a semi-upright position in a recliner or using a couple pillows under your head will help with this.  · Get rest as you feel it is needed. Your body will usually let you know when you need to rest.  PREVENTION  A pneumococcal shot (vaccine) is available to prevent a common bacterial cause of pneumonia. This is usually suggested for:  · People over 65 years old.  · Patients on chemotherapy.  · People with chronic lung problems, such as bronchitis or emphysema.  · People with immune system problems.  If you are over 65 or have a high risk condition, you may receive the pneumococcal vaccine if you have not received it before. In some countries, a routine influenza vaccine is also recommended. This vaccine can help prevent some cases of pneumonia. You may be offered the influenza vaccine as part of your care.  If you smoke, it is time to quit. You may receive instructions on how to stop smoking. Your health care provider can provide medicines and counseling to help you quit.  SEEK MEDICAL CARE IF:  You have a fever.  SEEK IMMEDIATE MEDICAL CARE IF:   · Your illness becomes worse. This is especially true if you are elderly or weakened from any other disease.  ·   You cannot control your cough with suppressants and are losing sleep.  · You begin coughing up blood.  · You develop pain which is getting worse or is uncontrolled with medicines.  · Any of the symptoms which initially brought you in for treatment are getting worse rather than better.  · You develop shortness of breath or chest pain.  MAKE SURE YOU:   · Understand these instructions.  · Will watch your condition.  · Will get help right away if you are not doing well or get worse.  Document Released: 12/23/2004 Document Revised: 05/09/2013 Document Reviewed: 03/14/2010  [employer]® Patient Information ©2015  [employer]. This information is not intended to replace advice given to you by your health care provider. Make sure you discuss any questions you have with your health care provider.    Asthma  Asthma is a recurring condition in which the airways tighten and narrow. Asthma can make it difficult to breathe. It can cause coughing, wheezing, and shortness of breath. Asthma episodes, also called asthma attacks, range from minor to life-threatening. Asthma cannot be cured, but medicines and lifestyle changes can help control it.  CAUSES  Asthma is believed to be caused by inherited (genetic) and environmental factors, but its exact cause is unknown. Asthma may be triggered by allergens, lung infections, or irritants in the air. Asthma triggers are different for each person. Common triggers include:   · Animal dander.  · Dust mites.  · Cockroaches.  · Pollen from trees or grass.  · Mold.  · Smoke.  · Air pollutants such as dust, household cleaners, hair sprays, aerosol sprays, paint fumes, strong chemicals, or strong odors.  · Cold air, weather changes, and winds (which increase molds and pollens in the air).  · Strong emotional expressions such as crying or laughing hard.  · Stress.  · Certain medicines (such as aspirin) or types of drugs (such as beta-blockers).  · Sulfites in foods and drinks. Foods and drinks that may contain sulfites include dried fruit, potato chips, and sparkling grape juice.  · Infections or inflammatory conditions such as the flu, a cold, or an inflammation of the nasal membranes (rhinitis).  · Gastroesophageal reflux disease (GERD).  · Exercise or strenuous activity.  SYMPTOMS  Symptoms may occur immediately after asthma is triggered or many hours later. Symptoms include:  · Wheezing.  · Excessive nighttime or early morning coughing.  · Frequent or severe coughing with a common cold.  · Chest tightness.  · Shortness of breath.  DIAGNOSIS   The diagnosis of asthma is made by a review of your  medical history and a physical exam. Tests may also be performed. These may include:  · Lung function studies. These tests show how much air you breathe in and out.  · Allergy tests.  · Imaging tests such as X-rays.  TREATMENT   Asthma cannot be cured, but it can usually be controlled. Treatment involves identifying and avoiding your asthma triggers. It also involves medicines. There are 2 classes of medicine used for asthma treatment:   · Controller medicines. These prevent asthma symptoms from occurring. They are usually taken every day.  · Reliever or rescue medicines. These quickly relieve asthma symptoms. They are used as needed and provide short-term relief.  Your health care provider will help you create an asthma action plan. An asthma action plan is a written plan for managing and treating your asthma attacks. It includes a list of your asthma triggers and how   they may be avoided. It also includes information on when medicines should be taken and when their dosage should be changed. An action plan may also involve the use of a device called a peak flow meter. A peak flow meter measures how well the lungs are working. It helps you monitor your condition.  HOME CARE INSTRUCTIONS   · Take medicines only as directed by your health care provider. Speak with your health care provider if you have questions about how or when to take the medicines.  · Use a peak flow meter as directed by your health care provider. Record and keep track of readings.  · Understand and use the action plan to help minimize or stop an asthma attack without needing to seek medical care.  · Control your home environment in the following ways to help prevent asthma attacks:  ¨ Do not smoke. Avoid being exposed to secondhand smoke.  ¨ Change your heating and air conditioning filter regularly.  ¨ Limit your use of fireplaces and wood stoves.  ¨ Get rid of pests (such as roaches and mice) and their droppings.  ¨ Throw away plants if you see  mold on them.  ¨ Clean your floors and dust regularly. Use unscented cleaning products.  ¨ Try to have someone else vacuum for you regularly. Stay out of rooms while they are being vacuumed and for a short while afterward. If you vacuum, use a dust mask from a hardware store, a double-layered or microfilter vacuum cleaner bag, or a vacuum cleaner with a HEPA filter.  ¨ Replace carpet with wood, tile, or vinyl flooring. Carpet can trap dander and dust.  ¨ Use allergy-proof pillows, mattress covers, and box spring covers.  ¨ Wash bed sheets and blankets every week in hot water and dry them in a dryer.  ¨ Use blankets that are made of polyester or cotton.  ¨ Clean bathrooms and kitchens with bleach. If possible, have someone repaint the walls in these rooms with mold-resistant paint. Keep out of the rooms that are being cleaned and painted.  ¨ Wash hands frequently.  SEEK MEDICAL CARE IF:   · You have wheezing, shortness of breath, or a cough even if taking medicine to prevent attacks.  · The colored mucus you cough up (sputum) is thicker than usual.  · Your sputum changes from clear or white to yellow, green, gray, or bloody.  · You have any problems that may be related to the medicines you are taking (such as a rash, itching, swelling, or trouble breathing).  · You are using a reliever medicine more than 2-3 times per week.  · Your peak flow is still at 50-79% of your personal best after following your action plan for 1 hour.  · You have a fever.  SEEK IMMEDIATE MEDICAL CARE IF:   · You seem to be getting worse and are unresponsive to treatment during an asthma attack.  · You are short of breath even at rest.  · You get short of breath when doing very little physical activity.  · You have difficulty eating, drinking, or talking due to asthma symptoms.  · You develop chest pain.  · You develop a fast heartbeat.  · You have a bluish color to your lips or fingernails.  · You are light-headed, dizzy, or faint.  · Your  peak flow is less than 50% of your personal best.  MAKE SURE YOU:   · Understand these instructions.  · Will watch your   condition.  · Will get help right away if you are not doing well or get worse.  Document Released: 12/23/2004 Document Revised: 05/09/2013 Document Reviewed: 07/22/2012  [employer]® Patient Information ©2015 [employer]. This information is not intended to replace advice given to you by your health care provider. Make sure you discuss any questions you have with your health care provider.

## 2015-02-17 ENCOUNTER — Encounter (HOSPITAL_COMMUNITY): Payer: Self-pay | Admitting: Nurse Practitioner

## 2015-02-17 ENCOUNTER — Emergency Department (HOSPITAL_COMMUNITY)
Admission: EM | Admit: 2015-02-17 | Discharge: 2015-02-17 | Disposition: A | Discharge status: Home / Self Care | Payer: BLUE CROSS/BLUE SHIELD | Attending: Emergency Medicine | Admitting: Emergency Medicine

## 2015-02-17 DIAGNOSIS — Z87440 Personal history of urinary (tract) infections: Secondary | ICD-10-CM | POA: Insufficient documentation

## 2015-02-17 DIAGNOSIS — F17210 Nicotine dependence, cigarettes, uncomplicated: Secondary | ICD-10-CM | POA: Diagnosis not present

## 2015-02-17 DIAGNOSIS — R05 Cough: Secondary | ICD-10-CM | POA: Diagnosis not present

## 2015-02-17 DIAGNOSIS — R059 Cough, unspecified: Secondary | ICD-10-CM

## 2015-02-17 DIAGNOSIS — R509 Fever, unspecified: Secondary | ICD-10-CM | POA: Insufficient documentation

## 2015-02-17 DIAGNOSIS — R112 Nausea with vomiting, unspecified: Secondary | ICD-10-CM | POA: Diagnosis not present

## 2015-02-17 DIAGNOSIS — Z79899 Other long term (current) drug therapy: Secondary | ICD-10-CM | POA: Insufficient documentation

## 2015-02-17 DIAGNOSIS — Z8619 Personal history of other infectious and parasitic diseases: Secondary | ICD-10-CM | POA: Diagnosis not present

## 2015-02-17 DIAGNOSIS — Z880 Allergy status to penicillin: Secondary | ICD-10-CM | POA: Insufficient documentation

## 2015-02-17 DIAGNOSIS — H6691 Otitis media, unspecified, right ear: Secondary | ICD-10-CM | POA: Insufficient documentation

## 2015-02-17 DIAGNOSIS — J45909 Unspecified asthma, uncomplicated: Secondary | ICD-10-CM | POA: Diagnosis not present

## 2015-02-17 DIAGNOSIS — Z7982 Long term (current) use of aspirin: Secondary | ICD-10-CM | POA: Diagnosis not present

## 2015-02-17 DIAGNOSIS — R52 Pain, unspecified: Secondary | ICD-10-CM

## 2015-02-17 DIAGNOSIS — Z3202 Encounter for pregnancy test, result negative: Secondary | ICD-10-CM | POA: Insufficient documentation

## 2015-02-17 DIAGNOSIS — R0789 Other chest pain: Secondary | ICD-10-CM | POA: Insufficient documentation

## 2015-02-17 LAB — URINALYSIS, ROUTINE W REFLEX MICROSCOPIC
Bilirubin Urine: NEGATIVE
Glucose, UA: NEGATIVE mg/dL
Hgb urine dipstick: NEGATIVE
KETONES UR: NEGATIVE mg/dL
Leukocytes, UA: NEGATIVE
Nitrite: NEGATIVE
Protein, ur: NEGATIVE mg/dL
Specific Gravity, Urine: 1.02 (ref 1.005–1.030)
pH: 6.5 (ref 5.0–8.0)

## 2015-02-17 LAB — COMPREHENSIVE METABOLIC PANEL
ALT: 13 U/L — AB (ref 14–54)
AST: 21 U/L (ref 15–41)
Albumin: 3.8 g/dL (ref 3.5–5.0)
Alkaline Phosphatase: 63 U/L (ref 38–126)
Anion gap: 8 (ref 5–15)
BILIRUBIN TOTAL: 0.5 mg/dL (ref 0.3–1.2)
BUN: 8 mg/dL (ref 6–20)
CALCIUM: 8.8 mg/dL — AB (ref 8.9–10.3)
CO2: 25 mmol/L (ref 22–32)
Chloride: 102 mmol/L (ref 101–111)
Creatinine, Ser: 0.94 mg/dL (ref 0.44–1.00)
Glucose, Bld: 80 mg/dL (ref 65–99)
Potassium: 3.8 mmol/L (ref 3.5–5.1)
Sodium: 135 mmol/L (ref 135–145)
TOTAL PROTEIN: 7.4 g/dL (ref 6.5–8.1)

## 2015-02-17 LAB — LIPASE, BLOOD: LIPASE: 26 U/L (ref 11–51)

## 2015-02-17 LAB — I-STAT BETA HCG BLOOD, ED (MC, WL, AP ONLY)

## 2015-02-17 LAB — CBC
HCT: 36.5 % (ref 36.0–46.0)
Hemoglobin: 11.7 g/dL — ABNORMAL LOW (ref 12.0–15.0)
MCH: 25.7 pg — ABNORMAL LOW (ref 26.0–34.0)
MCHC: 32.1 g/dL (ref 30.0–36.0)
MCV: 80 fL (ref 78.0–100.0)
Platelets: 285 10*3/uL (ref 150–400)
RBC: 4.56 MIL/uL (ref 3.87–5.11)
RDW: 13.7 % (ref 11.5–15.5)
WBC: 5.1 10*3/uL (ref 4.0–10.5)

## 2015-02-17 LAB — I-STAT TROPONIN, ED: Troponin i, poc: 0.01 ng/mL (ref 0.00–0.08)

## 2015-02-17 MED ORDER — ALBUTEROL SULFATE HFA 108 (90 BASE) MCG/ACT IN AERS: 2.0000 | INHALATION_SPRAY | RESPIRATORY_TRACT | Status: DC | PRN

## 2015-02-17 MED ORDER — IBUPROFEN 800 MG PO TABS: 800.0000 mg | ORAL_TABLET | Freq: Three times a day (TID) | ORAL | Status: DC

## 2015-02-17 MED ORDER — IPRATROPIUM-ALBUTEROL 0.5-2.5 (3) MG/3ML IN SOLN
3.0000 mL | Freq: Once | RESPIRATORY_TRACT | Status: AC
Start: 2015-02-17 — End: 2015-02-17
  Administered 2015-02-17: 3 mL via RESPIRATORY_TRACT
  Filled 2015-02-17: qty 3

## 2015-02-17 MED ORDER — KETOROLAC TROMETHAMINE 60 MG/2ML IM SOLN
60.0000 mg | Freq: Once | INTRAMUSCULAR | Status: AC
  Administered 2015-02-17: 60 mg via INTRAMUSCULAR
  Filled 2015-02-17: qty 2

## 2015-02-17 MED ORDER — BENZONATATE 100 MG PO CAPS
200.0000 mg | ORAL_CAPSULE | Freq: Once | ORAL | Status: AC
  Administered 2015-02-17: 200 mg via ORAL
  Filled 2015-02-17: qty 2

## 2015-02-17 MED ORDER — AZITHROMYCIN 250 MG PO TABS: 250.0000 mg | ORAL_TABLET | Freq: Every day | ORAL | Status: DC

## 2015-02-17 MED ORDER — BENZONATATE 100 MG PO CAPS: 100.0000 mg | ORAL_CAPSULE | Freq: Three times a day (TID) | ORAL | Status: DC

## 2015-02-17 MED ORDER — ACETAMINOPHEN 325 MG PO TABS
650.0000 mg | ORAL_TABLET | Freq: Once | ORAL | Status: AC
  Administered 2015-02-17: 650 mg via ORAL
  Filled 2015-02-17: qty 2

## 2015-02-17 MED ORDER — ONDANSETRON 4 MG PO TBDP: 4.0000 mg | ORAL_TABLET | Freq: Three times a day (TID) | ORAL | Status: DC | PRN

## 2015-02-17 MED ORDER — AZITHROMYCIN 250 MG PO TABS
500.0000 mg | ORAL_TABLET | Freq: Once | ORAL | Status: AC
  Administered 2015-02-17: 500 mg via ORAL
  Filled 2015-02-17: qty 2

## 2015-02-17 MED ORDER — ALBUTEROL SULFATE (2.5 MG/3ML) 0.083% IN NEBU
2.5000 mg | INHALATION_SOLUTION | Freq: Once | RESPIRATORY_TRACT | Status: AC
  Administered 2015-02-17: 2.5 mg via RESPIRATORY_TRACT
  Filled 2015-02-17: qty 3

## 2015-02-17 MED ORDER — ONDANSETRON 4 MG PO TBDP
4.0000 mg | ORAL_TABLET | Freq: Once | ORAL | Status: AC
  Administered 2015-02-17: 4 mg via ORAL
  Filled 2015-02-17: qty 1

## 2015-02-17 MED ORDER — ALBUTEROL SULFATE HFA 108 (90 BASE) MCG/ACT IN AERS
2.0000 | INHALATION_SPRAY | Freq: Once | RESPIRATORY_TRACT | Status: AC
  Administered 2015-02-17: 2 via RESPIRATORY_TRACT
  Filled 2015-02-17: qty 6.7

## 2015-02-17 NOTE — Discharge Instructions (Signed)
You have been seen today for generalized body aches, ear pain, and cough. Your lab tests showed no abnormalities. It is likely that you have a viral illness that is caused your ear infection. Get plenty of rest and drink plenty of fluids. Tessalon for cough. Zofran for nausea. Ibuprofen or Tylenol for pain or fever. Please take all of your antibiotics until finished!   You may develop abdominal discomfort or diarrhea from the antibiotic.  You may help offset this with probiotics which you can buy or get in yogurt. Do not eat or take the probiotics until 2 hours after your antibiotic.  Follow up with PCP as needed. Return to ED should symptoms worsen.   Emergency Department Resource Guide 1) Find a Doctor and Pay Out of Pocket Although you won't have to find out who is covered by your insurance plan, it is a good idea to ask around and get recommendations. You will then need to call the office and see if the doctor you have chosen will accept you as a new patient and what types of options they offer for patients who are self-pay. Some doctors offer discounts or will set up payment plans for their patients who do not have insurance, but you will need to ask so you aren't surprised when you get to your appointment.  2) Contact Your Local Health Department Not all health departments have doctors that can see patients for sick visits, but many do, so it is worth a call to see if yours does. If you don't know where your local health department is, you can check in your phone book. The CDC also has a tool to help you locate your state's health department, and many state websites also have listings of all of their local health departments.  3) Find a Walk-in Clinic If your illness is not likely to be very severe or complicated, you may want to try a walk in clinic. These are popping up all over the country in pharmacies, drugstores, and shopping centers. They're usually staffed by nurse practitioners or physician  assistants that have been trained to treat common illnesses and complaints. They're usually fairly quick and inexpensive. However, if you have serious medical issues or chronic medical problems, these are probably not your best option.  No Primary Care Doctor: - Call Health Connect at  [phone] - they can help you locate a primary care doctor that  accepts your insurance, provides certain services, etc. - Physician Referral Service- [phone]  Chronic Pain Problems: Organization         Address  Phone   Notes  [facility]  [phone] Patients need to be referred by their primary care doctor.   Medication Assistance: Organization         Address  Phone   Notes  [facility] Medication [facility] [address] [place] [place], [place] [address] [phone] --Must be a resident of [facility] -- Must have NO insurance coverage whatsoever (no Medicaid/ Medicare, etc.) -- The pt. MUST have a primary care doctor that directs their care regularly and follows them in the community   [facility]  [phone]   [employer]  [phone]    Agencies that provide inexpensive medical care: Organization         Address  Phone   Notes  [facility]  [phone]   [facility]    [phone]   [facility]  [facility] [address] [place], [place] [address] [phone]) [phone]   [facility] [place] [address], [place] [phone]) [phone]   [facility]anned [facility]    [phone]) [phone]   [facility]    [phone]) [phone]   [facility] and [facility]  [address], [place] Phone:  [phone](336) [phone], Fax:  [phone] Hours of Operation:  9 am - 6 pm, M-F.  Also accepts Medicaid/Medicare and self-pay.  [facility]  [address], [place] Phone: [phone](336) [phone], Fax: [phone](336) [phone]. Hours of Operation:  8:30 am - 5:30 pm, M-F.  Also accepts  Medicaid and self-pay.  [facility] [place] [address], [place] Phone: [phone](336) [phone]   [facility] [address], [place], [place] [phone], Ext. [phone] Mondays & Thursdays: 7-9 AM.  First 15 patients are seen on a first come, first serve basis.    Medicaid-accepting [facility] Providers:  Organization         Address  Phone   Notes  [facility] [address], [place] [phone](336) [phone] Also accepts self-pay patients.  [facility] [address], [place]  [phone]) [phone]   [facility] [address], [place] [phone]) [phone]   [facility] [address] [place] [address], [place] [phone]) [phone]   Renaye RakersVeita Bland [address], [place]   [phone](336) [phone] Only accepts [place] Access [place] patients after they have their name applied to their card.   Self-Pay (no insurance) in [facility]:  Organization         Address  Phone   Notes  Sickle Cell Patients, [facility] [address], [place] [phone](336) [phone]   [facility] [address], [place] [phone]) [phone]   [facility]  [address], [place] [phone]) [phone]   [facility]/Dr. Osei-Bonsu  [address] [place] [address], [place] or [phone], [place] [phone]) [phone] Phone number for both [place] and [place] locations is the same.  [facility] [address], [place] [phone]) [phone]   [facility]  [address] [place] [address], [place] or [address] [phone]) [phone]   [facility] [address], [place] [phone](336) [phone], phone; [phone](336) [phone], fax Sees patients 1st and 3rd Saturday of every month.  Must not qualify for public or private insurance (i.e. Medicaid, Medicare, [place] Health Choice, Veterans' Benefits)  Household  income should be no more than 200% of the poverty level The clinic cannot treat you if you are pregnant or think you are pregnant  Sexually transmitted diseases are not treated at the clinic.    Dental Care: Organization         Address  Phone  Notes  [facility] of [facility] [address], [place] [phone]) [phone] Accepts children up to age 40 who are enrolled in [place] or [place] Health Choice; pregnant women with a Medicaid card; and children who have applied for Medicaid or [place] Health Choice, but were declined, whose parents can pay a reduced fee at time of service.  [facility] of [facility] [place]  [address], [place] [phone]) [phone] Accepts children up to age 40 who are enrolled in  Medicaid or [place] Health Choice; pregnant women with a Medicaid card; and children who have applied for Medicaid or [place] Health Choice, but were declined, whose parents can pay a reduced fee at time of service.  [facility]  [address] [place], [place] [phone] Patients are seen by appointment only. Walk-ins are not accepted. [facility] will see patients 78 years of age and older. Monday - Tuesday (8am-5pm) Most Wednesdays (8:30-5pm) $30 per visit, cash only  [facility] Adult [facility] Access PRO[facility]  [address], [facility] [phone] Patients are seen by appointment only. Walk-ins are not accepted. [facility] will see patients 18 years of age and older. One Wednesday Evening (Monthly: Volunteer Based).  $30 per visit, cash only  [employer]  [phone] for adults; Children under age 42, call Graduate Pediatric Dentistry at [phone]. Children aged 71-14, please call [phone] to request a pediatric application.  Dental services are provided in all areas of dental care including fillings, crowns and bridges, complete and partial dentures, implants, gum  treatment, root canals, and extractions. Preventive care is also provided. Treatment is provided to both adults and children. Patients are selected via a lottery and there is often a waiting list.   [facility] [address] [place]  [phone] [email or web address]   [facility] [address] [place], [place] [phone], Ext. [phone] Second and Fourth Thursday of each month, opens at 6:30 AM; Clinic ends at 9 AM.  Patients are seen on a first-come first-served basis, and a limited number are seen during each clinic.   [facility]  [address] [place] [place], [place] [phone]   Eligibility Requirements You must have lived in [place], [place], or [place] counties for at least the last three months.   You cannot be eligible for state or federal sponsored [place], including [employer], [place], or [employer].   You generally cannot be eligible for healthcare insurance through your employer.    How to apply: Eligibility screenings are held every Tuesday and Wednesday afternoon from 1:00 pm until 4:00 pm. You do not need an appointment for the interview!  [facility] [address], [place], [place] [phone]   [facility] Health Department  [phone]   [facility] Health Department  [phone]   [facility] Health Department  [phone]    Behavioral Health Resources in the Community: Intensive Outpatient Programs Organization         Address  Phone  Notes  [facility] [address], [place], [place] [phone]   [facility] Outpatient [address] [place], [place] [phone]   [facility]: [facility] [address] [place], [place]  [phone]   [facility] [address]  [place], [place] [phone] or [phone]   Substance Abuse Resources Organization         Address  Phone  Notes  Alcohol and  Drug Services  [phone]   [facility]  [phone]   [facility] [place]  [phone]   Floydene Flock  [phone]   Residential & Outpatient Substance Abuse Program  [phone]   Psychological Services Organization         Address  Phone  Notes  [facility] Behavioral Health  [phone]   [facility] Services  [phone]   [facility] Mental Health [address], [place] [phone] or  [phone]    Mobile Crisis Teams Organization         Address  Phone  Notes  [facility], Mobile Crisis Care Unit  [phone]   [facility]  [address] [place], [place] [phone]   [facility] [address] [place] [phone]    Self-Help/Support Groups Organization         Address  Phone             Notes  Mental Health Assoc. of  - variety of support groups  336- [phone] Call for more information  Narcotics Anonymous (NA), [facility] [address], [place]  2 meetings at this location   Statisticianesidential Treatment Programs Organization         Address  Phone  Notes  [facility] [address],    [place]  [phone]   [facility]  [address], [place] [phone], [place], [place] [phone]   [facility] [address], [place] [phone] Admissions: 8am-3pm M-F  [facility] [address]    [place], [place] [phone]   [facility] [address], [place], [place] [phone]   [facility] [address]  [place], [place] [phone]   [facility] - Intensive Outpatient [address], [place], [place] [phone]   [facility] ([facility] [address]  [place], [place] [phone] or [phone]   [facility] ([facility]) [address] [place], [place] [phone] Accepts Medicaid  Fellowship  [place] [address]  [place] [phone] Substance Abuse/Addiction Treatment   [facility] Behavioral Health Resources Organization         Address  Phone  Notes  [facility]  [phone]) [phone]   Angie FavaJulie Brannon, PhD [address], [address] [place], [place]   [phone](336) [phone] or [phone]) [phone]   [facility]   [address] [place], [place] [phone](336) [phone]   [facility] [address], [place], [place] [phone]) [phone] Insurance/Medicaid/sponsorship through [facility]  [facility] [address]                                    [place], [place] [phone]) [phone] Therapy/tele-psych/case  [facility] [address]   [place], [place] [phone]) [phone]    Dr. Lolly MustacheArfeen  [phone](336) [phone]   [facility]  [employer] [facility] 1) [address], [place] 2) [address], [place] 3)  [address], [place] [phone]) [phone]  [phone](336) [phone]   [facility] Child Abuse Hotline [phone](336) [phone] or [phone]) [phone] (After Hours)

## 2015-02-17 NOTE — ED Notes (Signed)
She c/o 2 day history of body aches, ear aches, headaches, chest pain, intermittent R arm numbness, and n/v. Symptoms are worse at night. She took cough/cold/flu nighttime OTC medicine with no relief. She is A&Ox4, resp e/u

## 2015-02-17 NOTE — ED Provider Notes (Signed)
CSN: [id number]     Arrival date & time 02/17/15  1229 History   None    Chief Complaint  Patient presents with  . Generalized Body Aches     (Consider location/radiation/quality/duration/timing/severity/associated sxs/prior Treatment) HPI   Sherri Reid is a 40 y.o. female, with a history of asthma, presenting to the ED with body aches, N/V, and dry cough for the last three days. Pt also complains of subjective fever with chills and chest tightness upon coughing. Pt has taken OTC Cold/Flu medication without relief. Pt has vomited three times in the past 24 hours. Patient also complains of right ear pain that has been present over the same time., Rates it 7 out of 10, achy, nonradiating. Pt denies diarrhea/constipation, abdominal pain, shortness of breath, or any other complaints.     Past Medical History  Diagnosis Date  . Asthma   . Syphilis     treated  . Infection     UTI  . Vaginal Pap smear, abnormal     f/u ok   Past Surgical History  Procedure Laterality Date  . No past surgeries     Family History  Problem Relation Age of Onset  . Anesthesia problems Neg Hx   . Hyperlipidemia Mother   . Mental illness Mother   . Hypertension Mother   . Diabetes Father   . Cancer Maternal Aunt   . Cancer Paternal Uncle    Social History  Substance Use Topics  . Smoking status: Current Every Day Smoker -- 1.00 packs/day for 22 years    Types: Cigarettes  . Smokeless tobacco: Never Used  . Alcohol Use: Yes     Comment: occasionally   OB History    Gravida Para Term Preterm AB TAB SAB Ectopic Multiple Living       Review of Systems  Constitutional: Positive for fever and chills. Negative for diaphoresis.  Respiratory: Positive for chest tightness. Negative for shortness of breath.   Gastrointestinal: Positive for nausea and vomiting. Negative for abdominal pain.  All other systems reviewed and are negative.     Allergies  Penicillins  Home  Medications   Prior to Admission medications   Medication Sig Start Date End Date Taking? Authorizing Provider  albuterol (PROVENTIL HFA;VENTOLIN HFA) 108 (90 BASE) MCG/ACT inhaler Inhale 2 puffs into the lungs every 4 (four) hours as needed for wheezing or shortness of breath. 03/13/14   Shon Baton, MD  albuterol (PROVENTIL HFA;VENTOLIN HFA) 108 (90 Base) MCG/ACT inhaler Inhale 2 puffs into the lungs every 4 (four) hours as needed for wheezing or shortness of breath. 02/17/15   Anselm Pancoast, PA-C  aspirin EC 325 MG tablet Take 650 mg by mouth 2 (two) times daily as needed (for pain).    Historical Provider, MD  azithromycin (ZITHROMAX Z-PAK) 250 MG tablet Take 1 tablet (250 mg total) by mouth daily. 02/17/15   Maguadalupe Lata C Seva Chancy, PA-C  azithromycin (ZITHROMAX) 250 MG tablet Take first 2 tablets together, then 1 every day until finished. 03/13/14   Shon Baton, MD  benzonatate (TESSALON) 100 MG capsule Take 1 capsule (100 mg total) by mouth every 8 (eight) hours. 02/17/15   Koleman Marling C Antiono Ettinger, PA-C  ibuprofen (ADVIL,MOTRIN) 800 MG tablet Take 1 tablet (800 mg total) by mouth 3 (three) times daily. Patient taking differently: Take 800 mg by mouth 3 (three) times daily as needed for moderate pain.  03/09/14   Bonita Quin  Lauris Chroman, NP  ibuprofen (ADVIL,MOTRIN) 800 MG tablet Take 1 tablet (800 mg total) by mouth 3 (three) times daily. 02/17/15   Megha Agnes C Charmika Macdonnell, PA-C  ondansetron (ZOFRAN ODT) 4 MG disintegrating tablet Take 1 tablet (4 mg total) by mouth every 8 (eight) hours as needed for nausea or vomiting. 02/17/15   Goran Olden C Airel Magadan, PA-C  predniSONE (DELTASONE) 20 MG tablet Take 3 tablets (60 mg total) by mouth daily with breakfast. 03/13/14   Shon Baton, MD   BP 114/51 mmHg  Pulse 96  Temp(Src) 99.9 F (37.7 C) (Oral)  Resp 18  Ht 5\' 2"  (1.575 m)  Wt 120.521 kg  BMI 48.58 kg/m2  SpO2 99%  LMP 02/05/2015 Physical Exam  Constitutional: She appears well-developed and well-nourished. No distress.  HENT:  Head:  Normocephalic and atraumatic.  Mouth/Throat: Oropharynx is clear and moist.  Eyes: Conjunctivae are normal. Pupils are equal, round, and reactive to light.  Neck: Normal range of motion. Neck supple.  Cardiovascular: Normal rate, regular rhythm, normal heart sounds and intact distal pulses.   Pulmonary/Chest: Effort normal. No tachypnea. No respiratory distress. She has wheezes in the right upper field, the right middle field, the left upper field and the left middle field.  Tenderness to the anterior chest. Mimics the pain that patient complains of. No increased work of breathing. Patient speaks in full sentences without difficulty.  Abdominal: Soft. Bowel sounds are normal. There is no tenderness. There is no guarding.  Musculoskeletal: She exhibits no edema or tenderness.  Lymphadenopathy:    She has no cervical adenopathy.  Neurological: She is alert.  Skin: Skin is warm and dry. She is not diaphoretic.  Nursing note and vitals reviewed.   ED Course  Procedures (including critical care time) Labs Review Labs Reviewed  COMPREHENSIVE METABOLIC PANEL - Abnormal; Notable for the following:    Calcium 8.8 (*)    ALT 13 (*)    All other components within normal limits  CBC - Abnormal; Notable for the following:    Hemoglobin 11.7 (*)    MCH 25.7 (*)    All other components within normal limits  LIPASE, BLOOD  URINALYSIS, ROUTINE W REFLEX MICROSCOPIC (NOT AT [facility])  I-STAT BETA HCG BLOOD, ED ([facility], [facility], [facility] ONLY)  I-STAT TROPOININ, ED    Imaging Review No results found. I have personally reviewed and evaluated these lab results as part of my medical decision-making.   EKG Interpretation None      MDM   Final diagnoses:  Body aches  Cough  Fever, unspecified fever cause  Acute right otitis media, recurrence not specified, unspecified otitis media type    Sherri Reid resents with body aches, ear pain, and cough for the last 3 days.  Patient is nontoxic appearing, not  tachycardic, not tachypneic, maintains SPO2 of 99% on room air, and is in no apparent distress. Patient has no signs of sepsis or other serious or life-threatening condition. It is likely the patient's symptoms are caused by a virus, possibly instigating her otitis media. Patient's wheezes resolved after one breathing treatment here in the ED. No indication for imaging at this time. Antibiotic choice for her otitis media was selected based on the patient's penicillin allergy. Patient was sent home with an albuterol inhaler as well as a prescription for more. Patient improved with symptomatic conservative measures here in the ED. The patient was given instructions for home care as well as return precautions. Patient voices understanding of these instructions, accepts  the plan, and is comfortable with discharge.   Filed Vitals:   02/17/15 1248 02/17/15 1526  BP: 153/85 114/51  Pulse: 94 96  Temp: 100.2 F (37.9 C) 99.9 F (37.7 C)  TempSrc: Oral Oral  Resp: 16 18  Height:  (1.575 m)   Weight: 120.521 kg   SpO2: 99% 99%     Anselm Pancoast, PA-C 02/17/15 1538  Tilden Fossa, MD 02/18/15 [phone]

## 2015-04-25 ENCOUNTER — Encounter (HOSPITAL_COMMUNITY): Payer: Self-pay | Admitting: Emergency Medicine

## 2015-04-25 ENCOUNTER — Emergency Department (HOSPITAL_COMMUNITY)
Admission: EM | Admit: 2015-04-25 | Discharge: 2015-04-25 | Disposition: A | Discharge status: Home / Self Care | Payer: Worker's Compensation | Attending: Emergency Medicine | Admitting: Emergency Medicine

## 2015-04-25 ENCOUNTER — Emergency Department (HOSPITAL_COMMUNITY): Payer: Worker's Compensation

## 2015-04-25 DIAGNOSIS — Y9289 Other specified places as the place of occurrence of the external cause: Secondary | ICD-10-CM | POA: Diagnosis not present

## 2015-04-25 DIAGNOSIS — Y9389 Activity, other specified: Secondary | ICD-10-CM | POA: Diagnosis not present

## 2015-04-25 DIAGNOSIS — F17210 Nicotine dependence, cigarettes, uncomplicated: Secondary | ICD-10-CM | POA: Insufficient documentation

## 2015-04-25 DIAGNOSIS — W208XXA Other cause of strike by thrown, projected or falling object, initial encounter: Secondary | ICD-10-CM | POA: Diagnosis not present

## 2015-04-25 DIAGNOSIS — Z792 Long term (current) use of antibiotics: Secondary | ICD-10-CM | POA: Diagnosis not present

## 2015-04-25 DIAGNOSIS — J45909 Unspecified asthma, uncomplicated: Secondary | ICD-10-CM | POA: Insufficient documentation

## 2015-04-25 DIAGNOSIS — M79672 Pain in left foot: Secondary | ICD-10-CM

## 2015-04-25 DIAGNOSIS — Z8619 Personal history of other infectious and parasitic diseases: Secondary | ICD-10-CM | POA: Diagnosis not present

## 2015-04-25 DIAGNOSIS — Z87440 Personal history of urinary (tract) infections: Secondary | ICD-10-CM | POA: Diagnosis not present

## 2015-04-25 DIAGNOSIS — Y998 Other external cause status: Secondary | ICD-10-CM | POA: Insufficient documentation

## 2015-04-25 DIAGNOSIS — Z880 Allergy status to penicillin: Secondary | ICD-10-CM | POA: Insufficient documentation

## 2015-04-25 DIAGNOSIS — Z7982 Long term (current) use of aspirin: Secondary | ICD-10-CM | POA: Diagnosis not present

## 2015-04-25 DIAGNOSIS — Z79899 Other long term (current) drug therapy: Secondary | ICD-10-CM | POA: Diagnosis not present

## 2015-04-25 DIAGNOSIS — S99922A Unspecified injury of left foot, initial encounter: Secondary | ICD-10-CM | POA: Diagnosis present

## 2015-04-25 MED ORDER — HYDROCODONE-ACETAMINOPHEN 5-325 MG PO TABS
1.0000 | ORAL_TABLET | Freq: Once | ORAL | Status: AC
  Administered 2015-04-25: 1 via ORAL
  Filled 2015-04-25: qty 1

## 2015-04-25 MED ORDER — IBUPROFEN 800 MG PO TABS: 800.0000 mg | ORAL_TABLET | Freq: Three times a day (TID) | ORAL | Status: DC | PRN

## 2015-04-25 NOTE — ED Provider Notes (Signed)
CSN: [id number]     Arrival date & time 04/25/15  0702 History   First MD Initiated Contact with Patient 04/25/15 [phone]     Chief Complaint  Patient presents with  . Foot Injury     (Consider location/radiation/quality/duration/timing/severity/associated sxs/prior Treatment) Patient is a 40 y.o. female presenting with lower extremity pain.  Foot Pain This is a new problem. The current episode started yesterday. The problem occurs constantly. The symptoms are aggravated by walking. Nothing relieves the symptoms.    Past Medical History  Diagnosis Date  . Asthma   . Syphilis     treated  . Infection     UTI  . Vaginal Pap smear, abnormal     f/u ok   Past Surgical History  Procedure Laterality Date  . No past surgeries     Family History  Problem Relation Age of Onset  . Anesthesia problems Neg Hx   . Hyperlipidemia Mother   . Mental illness Mother   . Hypertension Mother   . Diabetes Father   . Cancer Maternal Aunt   . Cancer Paternal Uncle    Social History  Substance Use Topics  . Smoking status: Current Every Day Smoker -- 1.00 packs/day for 22 years    Types: Cigarettes  . Smokeless tobacco: Never Used  . Alcohol Use: Yes     Comment: occasionally   OB History    Gravida Para Term Preterm AB TAB SAB Ectopic Multiple Living   1 0 0 0 1 0 1 0 0 0      Review of Systems  Musculoskeletal: Positive for joint swelling. Negative for back pain.       Dropped a tea urn full of tea on her left medial foot and toe yesterday, persistent pain and swelling today.   All other systems reviewed and are negative.     Allergies  Penicillins  Home Medications   Prior to Admission medications   Medication Sig Start Date End Date Taking? Authorizing Provider  albuterol (PROVENTIL HFA;VENTOLIN HFA) 108 (90 BASE) MCG/ACT inhaler Inhale 2 puffs into the lungs every 4 (four) hours as needed for wheezing or shortness of breath. 03/13/14   Shon Batonourtney F Horton, MD  albuterol  (PROVENTIL HFA;VENTOLIN HFA) 108 (90 Base) MCG/ACT inhaler Inhale 2 puffs into the lungs every 4 (four) hours as needed for wheezing or shortness of breath. 02/17/15   Anselm PancoastShawn C Joy, PA-C  aspirin EC 325 MG tablet Take 650 mg by mouth 2 (two) times daily as needed (for pain).    Historical Provider, MD  azithromycin (ZITHROMAX Z-PAK) 250 MG tablet Take 1 tablet (250 mg total) by mouth daily. 02/17/15   Shawn C Joy, PA-C  azithromycin (ZITHROMAX) 250 MG tablet Take first 2 tablets together, then 1 every day until finished. 03/13/14   Shon Batonourtney F Horton, MD  benzonatate (TESSALON) 100 MG capsule Take 1 capsule (100 mg total) by mouth every 8 (eight) hours. 02/17/15   Shawn C Joy, PA-C  ibuprofen (ADVIL,MOTRIN) 800 MG tablet Take 1 tablet (800 mg total) by mouth 3 (three) times daily as needed for moderate pain. 04/25/15   Marily MemosJason Brean Carberry, MD  ondansetron (ZOFRAN ODT) 4 MG disintegrating tablet Take 1 tablet (4 mg total) by mouth every 8 (eight) hours as needed for nausea or vomiting. 02/17/15   Shawn C Joy, PA-C  predniSONE (DELTASONE) 20 MG tablet Take 3 tablets (60 mg total) by mouth daily with breakfast. 03/13/14   Shon Batonourtney F Horton, MD   BP  138/83 mmHg  Pulse 79  Temp(Src) 98.1 F (36.7 C) (Oral)  Resp 16  SpO2 100%  LMP 04/11/2015 Physical Exam  Constitutional: She appears well-developed and well-nourished.  HENT:  Head: Normocephalic and atraumatic.  Neck: Normal range of motion.  Cardiovascular: Normal rate and regular rhythm.   Pulmonary/Chest: No stridor. No respiratory distress.  Abdominal: Soft. She exhibits no distension. There is no tenderness.  Musculoskeletal: She exhibits edema and tenderness (left foot, first toe and medial side around 1st metatarsal).  Neurological: She is alert.  Skin: Skin is warm and dry.  Nursing note and vitals reviewed.   ED Course  Procedures (including critical care time) Labs Review Labs Reviewed - No data to display  Imaging Review Dg Foot Complete  Left  04/25/2015  CLINICAL DATA:  Patient dropped tea urn on distal foot 1 day prior EXAM: LEFT FOOT - COMPLETE 3+ VIEW COMPARISON:  Jun 06, 2011 FINDINGS: Frontal, oblique, and lateral views were obtained. There is no demonstrable fracture or dislocation. The joint spaces appear normal. No erosive change. IMPRESSION: No fracture or dislocation.  No appreciable arthropathy. Electronically Signed   By: Bretta Bang III M.D.   On: 04/25/2015 07:49   I have personally reviewed and evaluated these images and lab results as part of my medical decision-making.   EKG Interpretation None      MDM   Final diagnoses:  Left foot pain    Dropped heavy object on foot yesterday. Swelling and pain today. xr negative. Will dc with post opshoe.   New Prescriptions: New Prescriptions   No medications on file     I have personally and contemperaneously reviewed labs and imaging and used in my decision making as above.   A medical screening exam was performed and I feel the patient has had an appropriate workup for their chief complaint at this time and likelihood of emergent condition existing is low. Their vital signs are stable. They have been counseled on decision, discharge, follow up and which symptoms necessitate immediate return to the emergency department.  They verbally stated understanding and agreement with plan and discharged in stable condition.      Marily Memos, MD 04/25/15 [phone]

## 2015-04-25 NOTE — ED Notes (Signed)
Dropped tea urn on left great toe yesterday. Toe swollen and bruised this morning. Able to walk with pain.

## 2015-05-27 ENCOUNTER — Encounter (HOSPITAL_COMMUNITY): Payer: Self-pay

## 2015-05-27 ENCOUNTER — Emergency Department (HOSPITAL_COMMUNITY)
Admission: EM | Admit: 2015-05-27 | Discharge: 2015-05-27 | Disposition: A | Discharge status: Home / Self Care | Payer: BLUE CROSS/BLUE SHIELD | Attending: Emergency Medicine | Admitting: Emergency Medicine

## 2015-05-27 DIAGNOSIS — J45909 Unspecified asthma, uncomplicated: Secondary | ICD-10-CM | POA: Insufficient documentation

## 2015-05-27 DIAGNOSIS — F17210 Nicotine dependence, cigarettes, uncomplicated: Secondary | ICD-10-CM | POA: Insufficient documentation

## 2015-05-27 DIAGNOSIS — R197 Diarrhea, unspecified: Secondary | ICD-10-CM | POA: Insufficient documentation

## 2015-05-27 DIAGNOSIS — Z7982 Long term (current) use of aspirin: Secondary | ICD-10-CM | POA: Insufficient documentation

## 2015-05-27 DIAGNOSIS — Z791 Long term (current) use of non-steroidal anti-inflammatories (NSAID): Secondary | ICD-10-CM | POA: Insufficient documentation

## 2015-05-27 DIAGNOSIS — R112 Nausea with vomiting, unspecified: Secondary | ICD-10-CM | POA: Insufficient documentation

## 2015-05-27 DIAGNOSIS — Z79899 Other long term (current) drug therapy: Secondary | ICD-10-CM | POA: Insufficient documentation

## 2015-05-27 LAB — COMPREHENSIVE METABOLIC PANEL
ALK PHOS: 51 U/L (ref 38–126)
ALT: 14 U/L (ref 14–54)
AST: 17 U/L (ref 15–41)
Albumin: 3.4 g/dL — ABNORMAL LOW (ref 3.5–5.0)
Anion gap: 5 (ref 5–15)
CALCIUM: 8.6 mg/dL — AB (ref 8.9–10.3)
CO2: 24 mmol/L (ref 22–32)
CREATININE: 0.86 mg/dL (ref 0.44–1.00)
Chloride: 112 mmol/L — ABNORMAL HIGH (ref 101–111)
GFR calc non Af Amer: >60 mL/min (ref >60)
GLUCOSE: 95 mg/dL (ref 65–99)
Potassium: 3.4 mmol/L — ABNORMAL LOW (ref 3.5–5.1)
SODIUM: 141 mmol/L (ref 135–145)
Total Bilirubin: 0.3 mg/dL (ref 0.3–1.2)
Total Protein: 6.6 g/dL (ref 6.5–8.1)

## 2015-05-27 LAB — I-STAT BETA HCG BLOOD, ED (MC, WL, AP ONLY): I-stat hCG, quantitative: <5 m[IU]/mL (ref <5)

## 2015-05-27 LAB — URINALYSIS, ROUTINE W REFLEX MICROSCOPIC
BILIRUBIN URINE: NEGATIVE
Glucose, UA: NEGATIVE mg/dL
KETONES UR: NEGATIVE mg/dL
Leukocytes, UA: NEGATIVE
Nitrite: NEGATIVE
PROTEIN: NEGATIVE mg/dL
Specific Gravity, Urine: 1.025 (ref 1.005–1.030)
pH: 6 (ref 5.0–8.0)

## 2015-05-27 LAB — CBC
HCT: 38.1 % (ref 36.0–46.0)
Hemoglobin: 12 g/dL (ref 12.0–15.0)
MCH: 25.6 pg — AB (ref 26.0–34.0)
MCHC: 31.5 g/dL (ref 30.0–36.0)
MCV: 81.4 fL (ref 78.0–100.0)
PLATELETS: 293 10*3/uL (ref 150–400)
RBC: 4.68 MIL/uL (ref 3.87–5.11)
RDW: 14.4 % (ref 11.5–15.5)
WBC: 5.9 10*3/uL (ref 4.0–10.5)

## 2015-05-27 LAB — URINE MICROSCOPIC-ADD ON

## 2015-05-27 LAB — LIPASE, BLOOD: Lipase: 28 U/L (ref 11–51)

## 2015-05-27 MED ORDER — ONDANSETRON HCL 4 MG/2ML IJ SOLN
4.0000 mg | Freq: Once | INTRAMUSCULAR | Status: AC
  Administered 2015-05-27: 4 mg via INTRAVENOUS
  Filled 2015-05-27: qty 2

## 2015-05-27 MED ORDER — SODIUM CHLORIDE 0.9 % IV BOLUS (SEPSIS)
1000.0000 mL | Freq: Once | INTRAVENOUS | Status: AC
  Administered 2015-05-27: 1000 mL via INTRAVENOUS

## 2015-05-27 MED ORDER — KETOROLAC TROMETHAMINE 30 MG/ML IJ SOLN
30.0000 mg | Freq: Once | INTRAMUSCULAR | Status: AC
  Administered 2015-05-27: 30 mg via INTRAVENOUS
  Filled 2015-05-27: qty 1

## 2015-05-27 MED ORDER — ACETAMINOPHEN 500 MG PO TABS
1000.0000 mg | ORAL_TABLET | Freq: Once | ORAL | Status: AC
Start: 2015-05-27 — End: 2015-05-27
  Administered 2015-05-27: 1000 mg via ORAL
  Filled 2015-05-27: qty 2

## 2015-05-27 MED ORDER — ONDANSETRON 4 MG PO TBDP: 4.0000 mg | ORAL_TABLET | Freq: Three times a day (TID) | ORAL | Status: DC | PRN

## 2015-05-27 NOTE — ED Notes (Addendum)
Patient here with abdominal cramping, vomiting and diarrhea since Friday evening. Reports that she has vomited x 6 total since Friday. Few episodes of diarrhea following intake. No distress

## 2015-05-27 NOTE — Discharge Instructions (Signed)
As discussed, your evaluation today has been largely reassuring.  But, it is important that you monitor your condition carefully, and do not hesitate to return to the ED if you develop new, or concerning changes in your condition. ? ?Otherwise, please follow-up with your physician for appropriate ongoing care. ? ?

## 2015-05-27 NOTE — ED Provider Notes (Signed)
CSN: [id number]     Arrival date & time 05/27/15  0720 History   First MD Initiated Contact with Patient 05/27/15 [phone]     Chief Complaint  Patient presents with  . Abdominal Cramping  . Emesis     (Consider location/radiation/quality/duration/timing/severity/associated sxs/prior Treatment) HPI Patient presents with concern of nausea, vomiting, diarrhea, crampy abdominal pain. Pain is inconsistent, occurring without clear precipitant. Symptoms have been present for 2 days. Patient has one sick contacts, but otherwise no health changes, medication changes, diet changes, travel changes. No relief with ibuprofen. No fever, chest pain, dyspnea. Menstrual cycle started yesterday.  Past Medical History  Diagnosis Date  . Asthma   . Syphilis     treated  . Infection     UTI  . Vaginal Pap smear, abnormal     f/u ok   Past Surgical History  Procedure Laterality Date  . No past surgeries     Family History  Problem Relation Age of Onset  . Anesthesia problems Neg Hx   . Hyperlipidemia Mother   . Mental illness Mother   . Hypertension Mother   . Diabetes Father   . Cancer Maternal Aunt   . Cancer Paternal Uncle    Social History  Substance Use Topics  . Smoking status: Current Every Day Smoker -- 1.00 packs/day for 22 years    Types: Cigarettes  . Smokeless tobacco: Never Used  . Alcohol Use: Yes     Comment: occasionally   OB History    Gravida Para Term Preterm AB TAB SAB Ectopic Multiple Living       Review of Systems  Constitutional:       Per HPI, otherwise negative  HENT:       Per HPI, otherwise negative  Respiratory:       Per HPI, otherwise negative  Cardiovascular:       Per HPI, otherwise negative  Gastrointestinal: Positive for vomiting and diarrhea.  Endocrine:       Negative aside from HPI  Genitourinary:       Neg aside from HPI   Musculoskeletal:       Per HPI, otherwise negative  Skin: Negative.   Neurological:  Negative for syncope.      Allergies  Penicillins  Home Medications   Prior to Admission medications   Medication Sig Start Date End Date Taking? Authorizing Provider  albuterol (PROVENTIL HFA;VENTOLIN HFA) 108 (90 BASE) MCG/ACT inhaler Inhale 2 puffs into the lungs every 4 (four) hours as needed for wheezing or shortness of breath. 03/13/14  Yes Shon Baton, MD  aspirin EC 325 MG tablet Take 650 mg by mouth 2 (two) times daily as needed (for pain).   Yes Historical Provider, MD  ibuprofen (ADVIL,MOTRIN) 800 MG tablet Take 1 tablet (800 mg total) by mouth 3 (three) times daily as needed for moderate pain. 04/25/15  Yes Marily Memos, MD  ondansetron (ZOFRAN ODT) 4 MG disintegrating tablet Take 1 tablet (4 mg total) by mouth every 8 (eight) hours as needed for nausea or vomiting. 05/27/15   Gerhard Munch, MD   BP 120/66 mmHg  Pulse 77  Temp(Src) 98.2 F (36.8 C)  Resp 18  Ht  (1.575 m)  Wt 250 lb (113.399 kg)  BMI 45.71 kg/m2  SpO2 98%  LMP 05/22/2015 Physical Exam  Constitutional: She is oriented to person, place, and time. She appears well-developed and well-nourished. No distress.  HENT:  Head: Normocephalic and atraumatic.  Eyes: Conjunctivae and EOM are normal.  Cardiovascular: Normal rate and regular rhythm.   Pulmonary/Chest: Effort normal and breath sounds normal. No stridor. No respiratory distress.  Abdominal: She exhibits no distension.  Minimal diffuse abdominal discomfort  Musculoskeletal: She exhibits no edema.  Neurological: She is alert and oriented to person, place, and time. No cranial nerve deficit.  Skin: Skin is warm and dry.  Psychiatric: She has a normal mood and affect.  Nursing note and vitals reviewed.   ED Course  Procedures (including critical care time) Labs Review Labs Reviewed  COMPREHENSIVE METABOLIC PANEL - Abnormal; Notable for the following:    Potassium 3.4 (*)    Chloride 112 (*)    BUN <5 (*)    Calcium 8.6 (*)     Albumin 3.4 (*)    All other components within normal limits  CBC - Abnormal; Notable for the following:    MCH 25.6 (*)    All other components within normal limits  URINALYSIS, ROUTINE W REFLEX MICROSCOPIC (NOT AT [facility]) - Abnormal; Notable for the following:    Hgb urine dipstick MODERATE (*)    All other components within normal limits  URINE MICROSCOPIC-ADD ON - Abnormal; Notable for the following:    Squamous Epithelial / LPF 6-30 (*)    Bacteria, UA FEW (*)    All other components within normal limits  LIPASE, BLOOD  PREGNANCY, URINE   I have personally reviewed and evaluated these results as part of my medical decision-making.   10:32 AM Patient sleeping on repeat exam she is aware of all findings, follow-up instructions, return precautions.  She still has mild headache.   MDM   Final diagnoses:  Nausea vomiting and diarrhea   Patient presents with concern of nausea, vomiting, diarrhea. Here, the patient is awake, alert, afebrile, with soft, non-peritoneal abdomen. Patient is menstruating, and this is likely contributing to her discomfort. She was sleeping on repeat exam after initial fluids, analgesia, antiemetics. With reassuring labs, vitals, physical exam, the patient will follow-up with primary care, and there is low suspicion for acute abdominal processes, bacteremia or sepsis.    Gerhard Munchobert Bhumi Godbey, MD 05/27/15 [phone]

## 2015-05-27 NOTE — ED Notes (Signed)
MD Lockwood at the bedside  

## 2015-06-27 ENCOUNTER — Encounter (HOSPITAL_COMMUNITY): Payer: Self-pay | Admitting: *Deleted

## 2015-06-27 ENCOUNTER — Emergency Department (HOSPITAL_COMMUNITY)
Admission: EM | Admit: 2015-06-27 | Discharge: 2015-06-27 | Disposition: A | Discharge status: Home / Self Care | Payer: BLUE CROSS/BLUE SHIELD | Attending: Dermatology | Admitting: Dermatology

## 2015-06-27 DIAGNOSIS — R51 Headache: Secondary | ICD-10-CM | POA: Diagnosis present

## 2015-06-27 DIAGNOSIS — R1030 Lower abdominal pain, unspecified: Secondary | ICD-10-CM | POA: Diagnosis not present

## 2015-06-27 DIAGNOSIS — F17210 Nicotine dependence, cigarettes, uncomplicated: Secondary | ICD-10-CM | POA: Diagnosis not present

## 2015-06-27 DIAGNOSIS — Z5321 Procedure and treatment not carried out due to patient leaving prior to being seen by health care provider: Secondary | ICD-10-CM | POA: Diagnosis not present

## 2015-06-27 DIAGNOSIS — Z7982 Long term (current) use of aspirin: Secondary | ICD-10-CM | POA: Diagnosis not present

## 2015-06-27 DIAGNOSIS — J45909 Unspecified asthma, uncomplicated: Secondary | ICD-10-CM | POA: Insufficient documentation

## 2015-06-27 NOTE — ED Notes (Signed)
Patient c/o headache x2 days.  Patient also c/o sharp lower abdominal pain.  Patient endorses some nausea, but denies vomiting and changes in vision.  Patient denies weakness in extremities and dizziness.  Patient denies chest pain.

## 2015-06-27 NOTE — ED Notes (Signed)
Called patient 3 times, checked the bathroom and outside the ED entrance.  She did not respond.

## 2015-06-27 NOTE — ED Notes (Signed)
Called pt back to room x1 with no response 

## 2015-07-31 ENCOUNTER — Emergency Department (HOSPITAL_COMMUNITY)
Admission: EM | Admit: 2015-07-31 | Discharge: 2015-07-31 | Disposition: A | Discharge status: Home / Self Care | Payer: Self-pay | Attending: Emergency Medicine | Admitting: Emergency Medicine

## 2015-07-31 ENCOUNTER — Encounter (HOSPITAL_COMMUNITY): Payer: Self-pay

## 2015-07-31 ENCOUNTER — Emergency Department (HOSPITAL_COMMUNITY): Payer: Self-pay

## 2015-07-31 DIAGNOSIS — J45909 Unspecified asthma, uncomplicated: Secondary | ICD-10-CM | POA: Insufficient documentation

## 2015-07-31 DIAGNOSIS — R112 Nausea with vomiting, unspecified: Secondary | ICD-10-CM

## 2015-07-31 DIAGNOSIS — R197 Diarrhea, unspecified: Secondary | ICD-10-CM | POA: Insufficient documentation

## 2015-07-31 DIAGNOSIS — F17210 Nicotine dependence, cigarettes, uncomplicated: Secondary | ICD-10-CM | POA: Insufficient documentation

## 2015-07-31 DIAGNOSIS — R1031 Right lower quadrant pain: Secondary | ICD-10-CM | POA: Insufficient documentation

## 2015-07-31 LAB — CBC WITH DIFFERENTIAL/PLATELET
BASOS ABS: 0 10*3/uL (ref 0.0–0.1)
BASOS PCT: 0 %
EOS PCT: 1 %
Eosinophils Absolute: 0.1 10*3/uL (ref 0.0–0.7)
HCT: 37.9 % (ref 36.0–46.0)
Hemoglobin: 12.1 g/dL (ref 12.0–15.0)
Lymphocytes Relative: 38 %
Lymphs Abs: 3.3 10*3/uL (ref 0.7–4.0)
MCH: 26 pg (ref 26.0–34.0)
MCHC: 31.9 g/dL (ref 30.0–36.0)
MCV: 81.5 fL (ref 78.0–100.0)
MONO ABS: 0.5 10*3/uL (ref 0.1–1.0)
Monocytes Relative: 6 %
Neutro Abs: 4.9 10*3/uL (ref 1.7–7.7)
Neutrophils Relative %: 55 %
PLATELETS: 293 10*3/uL (ref 150–400)
RBC: 4.65 MIL/uL (ref 3.87–5.11)
RDW: 14.3 % (ref 11.5–15.5)
WBC: 8.8 10*3/uL (ref 4.0–10.5)

## 2015-07-31 LAB — COMPREHENSIVE METABOLIC PANEL
ALBUMIN: 3.3 g/dL — AB (ref 3.5–5.0)
ALT: 12 U/L — ABNORMAL LOW (ref 14–54)
AST: 20 U/L (ref 15–41)
Alkaline Phosphatase: 55 U/L (ref 38–126)
Anion gap: 5 (ref 5–15)
BUN: 11 mg/dL (ref 6–20)
CHLORIDE: 109 mmol/L (ref 101–111)
CO2: 25 mmol/L (ref 22–32)
Calcium: 8.5 mg/dL — ABNORMAL LOW (ref 8.9–10.3)
Creatinine, Ser: 0.89 mg/dL (ref 0.44–1.00)
GFR calc Af Amer: >60 mL/min (ref >60)
GFR calc non Af Amer: >60 mL/min (ref >60)
GLUCOSE: 90 mg/dL (ref 65–99)
POTASSIUM: 3.6 mmol/L (ref 3.5–5.1)
SODIUM: 139 mmol/L (ref 135–145)
Total Bilirubin: 0.5 mg/dL (ref 0.3–1.2)
Total Protein: 6.3 g/dL — ABNORMAL LOW (ref 6.5–8.1)

## 2015-07-31 LAB — URINALYSIS, ROUTINE W REFLEX MICROSCOPIC
Bilirubin Urine: NEGATIVE
GLUCOSE, UA: NEGATIVE mg/dL
Hgb urine dipstick: NEGATIVE
Ketones, ur: 15 mg/dL — AB
LEUKOCYTES UA: NEGATIVE
Nitrite: NEGATIVE
PROTEIN: NEGATIVE mg/dL
Specific Gravity, Urine: 1.037 — ABNORMAL HIGH (ref 1.005–1.030)
pH: 6 (ref 5.0–8.0)

## 2015-07-31 LAB — PREGNANCY, URINE: Preg Test, Ur: NEGATIVE

## 2015-07-31 LAB — WET PREP, GENITAL
Sperm: NONE SEEN
Trich, Wet Prep: NONE SEEN
Yeast Wet Prep HPF POC: NONE SEEN

## 2015-07-31 LAB — LIPASE, BLOOD: LIPASE: 20 U/L (ref 11–51)

## 2015-07-31 MED ORDER — SODIUM CHLORIDE 0.9 % IV BOLUS (SEPSIS)
1000.0000 mL | Freq: Once | INTRAVENOUS | Status: AC
  Administered 2015-07-31: 1000 mL via INTRAVENOUS

## 2015-07-31 MED ORDER — ONDANSETRON HCL 4 MG/2ML IJ SOLN
4.0000 mg | Freq: Once | INTRAMUSCULAR | Status: AC
  Administered 2015-07-31: 4 mg via INTRAVENOUS
  Filled 2015-07-31: qty 2

## 2015-07-31 MED ORDER — METRONIDAZOLE 500 MG PO TABS: 500.0000 mg | ORAL_TABLET | Freq: Two times a day (BID) | ORAL | 0 refills | Status: DC

## 2015-07-31 MED ORDER — ONDANSETRON 4 MG PO TBDP: 4.0000 mg | ORAL_TABLET | Freq: Three times a day (TID) | ORAL | 0 refills | Status: DC | PRN

## 2015-07-31 MED ORDER — IOPAMIDOL (ISOVUE-300) INJECTION 61%
INTRAVENOUS | Status: AC
  Filled 2015-07-31: qty 100

## 2015-07-31 MED ORDER — IOPAMIDOL (ISOVUE-300) INJECTION 61%
100.0000 mL | Freq: Once | INTRAVENOUS | Status: AC | PRN
  Administered 2015-07-31: 100 mL via INTRAVENOUS

## 2015-07-31 NOTE — ED Notes (Signed)
Pt is aware urine is needed, pt is unable to urinate at this time. 

## 2015-07-31 NOTE — ED Notes (Signed)
Patient transported to CT 

## 2015-07-31 NOTE — ED Triage Notes (Signed)
Pt here for diarrhea, vomitng and nausea and abd ppain onset a few days ago, reports pain in lower abd and thinks she has a virus.

## 2015-07-31 NOTE — ED Notes (Signed)
MD at bedside. 

## 2015-07-31 NOTE — ED Provider Notes (Signed)
[facility]-EMERGENCY DEPT Provider Note   CSN: [id number] Arrival date & time: 07/31/15  4098  First Provider Contact:  First MD Initiated Contact with Patient 07/31/15 [phone]        History   Chief Complaint Chief Complaint  Patient presents with  . Abdominal Pain  . Nausea  . Emesis  . Diarrhea    HPI Sherri Reid is a 40 y.o. female.  The history is provided by the patient.  Abdominal Pain   This is a new problem. The current episode started 2 days ago. The problem occurs constantly. The problem has not changed since onset.The pain is associated with an unknown factor. The pain is located in the RLQ. Associated symptoms include diarrhea and vomiting. Pertinent negatives include dysuria, frequency and hematuria. The symptoms are aggravated by certain positions. Nothing relieves the symptoms. Her past medical history does not include PUD or irritable bowel syndrome.  Emesis   Associated symptoms include abdominal pain and diarrhea.  Diarrhea   Associated symptoms include abdominal pain and vomiting. Her past medical history does not include irritable bowel syndrome.    Past Medical History:  Diagnosis Date  . Asthma   . Infection    UTI  . Syphilis    treated  . Vaginal Pap smear, abnormal    f/u ok    There are no active problems to display for this patient.   Past Surgical History:  Procedure Laterality Date  . NO PAST SURGERIES      OB History    Gravida Para Term Preterm AB Living   1 0 0 0 1 0   SAB TAB Ectopic Multiple Live Births   1 0 0 0         Home Medications    Prior to Admission medications   Medication Sig Start Date End Date Taking? Authorizing Provider  albuterol (PROVENTIL HFA;VENTOLIN HFA) 108 (90 BASE) MCG/ACT inhaler Inhale 2 puffs into the lungs every 4 (four) hours as needed for wheezing or shortness of breath. 03/13/14  Yes Shon Baton, MD  ibuprofen (ADVIL,MOTRIN) 800 MG tablet Take 1 tablet (800 mg total) by mouth 3 (three)  times daily as needed for moderate pain. 04/25/15  Yes Marily Memos, MD  metroNIDAZOLE (FLAGYL) 500 MG tablet Take 1 tablet (500 mg total) by mouth 2 (two) times daily. 07/31/15   Tery Sanfilippo, MD  ondansetron (ZOFRAN ODT) 4 MG disintegrating tablet Take 1 tablet (4 mg total) by mouth every 8 (eight) hours as needed for nausea or vomiting. 07/31/15   Tery Sanfilippo, MD    Family History Family History  Problem Relation Age of Onset  . Hyperlipidemia Mother   . Mental illness Mother   . Hypertension Mother   . Diabetes Father   . Cancer Maternal Aunt   . Cancer Paternal Uncle   . Anesthesia problems Neg Hx     Social History Social History  Substance Use Topics  . Smoking status: Current Every Day Smoker    Packs/day: 1.00    Years: 22.00    Types: Cigarettes  . Smokeless tobacco: Never Used  . Alcohol use Yes     Comment: occasionally     Allergies   Penicillins   Review of Systems Review of Systems  Gastrointestinal: Positive for abdominal pain, diarrhea and vomiting.  Genitourinary: Negative for dysuria, flank pain, frequency, hematuria, pelvic pain, vaginal bleeding and vaginal discharge.  All other systems reviewed and are negative.    Physical Exam Updated Vital  Signs BP 126/79   Pulse 74   Temp 98.1 F (36.7 C) (Oral)   Resp 16   Ht  (1.575 m)   Wt 113.1 kg   LMP 07/21/2015   SpO2 100%   BMI 45.60 kg/m   Physical Exam  Constitutional: She appears well-developed and well-nourished. No distress.  HENT:  Head: Normocephalic and atraumatic.  Eyes: Conjunctivae are normal.  Neck: Neck supple.  Cardiovascular: Normal rate and regular rhythm.   No murmur heard. Pulmonary/Chest: Effort normal and breath sounds normal. No respiratory distress.  Abdominal: Soft. There is tenderness in the right lower quadrant. There is tenderness at McBurney's point. There is no rigidity, no rebound, no CVA tenderness and negative Murphy's sign.  Musculoskeletal:  She exhibits no edema.  Neurological: She is alert.  Skin: Skin is warm and dry.  Psychiatric: She has a normal mood and affect.  Nursing note and vitals reviewed.   ED Treatments / Results  Labs (all labs ordered are listed, but only abnormal results are displayed) Labs Reviewed  WET PREP, GENITAL - Abnormal; Notable for the following:       Result Value   Clue Cells Wet Prep HPF POC PRESENT (*)    WBC, Wet Prep HPF POC MANY (*)    All other components within normal limits  COMPREHENSIVE METABOLIC PANEL - Abnormal; Notable for the following:    Calcium 8.5 (*)    Total Protein 6.3 (*)    Albumin 3.3 (*)    ALT 12 (*)    All other components within normal limits  URINALYSIS, ROUTINE W REFLEX MICROSCOPIC (NOT AT [facility]) - Abnormal; Notable for the following:    Specific Gravity, Urine 1.037 (*)    Ketones, ur 15 (*)    All other components within normal limits  CBC WITH DIFFERENTIAL/PLATELET  LIPASE, BLOOD  PREGNANCY, URINE  POC URINE PREG, ED  GC/CHLAMYDIA PROBE AMP ([place]) NOT AT [facility]    EKG  EKG Interpretation None       Radiology Ct Abdomen Pelvis W Contrast  Result Date: 07/31/2015 CLINICAL DATA:  Two day history of abdominal pain with diarrhea and vomiting EXAM: CT ABDOMEN AND PELVIS WITH CONTRAST TECHNIQUE: Multidetector CT imaging of the abdomen and pelvis was performed using the standard protocol following bolus administration of intravenous contrast. CONTRAST:  ISOVUE-300 IOPAMIDOL (ISOVUE-300) INJECTION 61% COMPARISON:  March 26, 2007 FINDINGS: Lower chest:  There is patchy bibasilar lung atelectasis. Hepatobiliary: No focal liver lesions are identified. Gallbladder wall is not appreciably thickened. There is no biliary duct dilatation. Pancreas: There is no pancreatic mass or inflammatory focus. Spleen: No splenic lesions are evident. Adrenals/Urinary Tract: Adrenals appear normal bilaterally. Kidneys bilaterally show no evidence of mass or  hydronephrosis. There is a 1 mm calculus in the upper to mid left kidney, nonobstructing. No ureteral evident on either side. Urinary bladder is midline with wall thickness within normal limits. Stomach/Bowel: There are scattered sigmoid diverticula bilaterally. There is no demonstrable diverticulitis. There is no appreciable bowel wall or mesenteric thickening. There is no bowel obstruction. No free air or portal venous air. Vascular/Lymphatic: There is no abdominal aortic aneurysm. No vascular lesions are evident on this study. There is no adenopathy in the abdomen or pelvis. Reproductive: Uterus is anteverted. There is a 2.2 x 2.0 cm area of peripheral enhancement in the left ovary, likely a collapsing hemorrhagic cyst. No other pelvic mass is seen. There is no free pelvic fluid or pelvic fluid collection. Other:  The appendix appears normal. There is no ascites or abscess in the abdomen or pelvis. There is a minimal ventral hernia containing only fat. Musculoskeletal: There is mild soft tissue stranding posterior to the lower back, possibly due to residual hematoma in this area. There is degenerative change in the lower lumbar spine. Vacuum phenomenon is noted at L5-S1. There are no blastic or lytic bone lesions. No intramuscular lesions are evident. IMPRESSION: Probable collapsing hemorrhagic cyst left ovary measuring 2.2 x 2.0 cm. No free pelvic fluid. No other pelvic mass. Sigmoid diverticula without diverticulitis. No bowel obstruction. No abscess. Appendix appears normal. 1 mm calculus mid to upper pole left kidney. No hydronephrosis on either side. No ureteral calculi on either side. Minimal ventral hernia containing only fat. Electronically Signed   By: Bretta Bang III M.D.   On: 07/31/2015 10:45   Procedures Procedures (including critical care time)  Medications Ordered in ED Medications  sodium chloride 0.9 % bolus 1,000 mL (0 mLs Intravenous Stopped 07/31/15 1114)  ondansetron (ZOFRAN)  injection 4 mg (4 mg Intravenous Given 07/31/15 0823)  iopamidol (ISOVUE-300) 61 % injection 100 mL (100 mLs Intravenous Contrast Given 07/31/15 1013)     Initial Impression / Assessment and Plan / ED Course  I have reviewed the triage vital signs and the nursing notes.  Pertinent labs & imaging results that were available during my care of the patient were reviewed by me and considered in my medical decision making (see chart for details).  Clinical Course    Patient is a 40 year old female presenting to the medicine department for nausea, vomiting, watery diarrhea and right lower quadrant abdominal pain over the last 3 days.  Ddx UTI, appendicitis, ectopic, torsion.   On physical exam patient is imminently stable and in no acute distress. Abdominal exam with guarding in the right lower quadrant. UA with no signs of infection. Urine pregnant negative. Low suspicion for torsion at this time due to slow onset and only mild pain. Pelvic exam with no marked adnexal tenderness. CBC, CMP, and lipase unremarkable. CT abdomen displayed no signs of appendicitis. In setting of vomiting and diarrhea likely associated with gastroenteritis. Given Zofran for symptomatically. Wet prep with clue cells and given metronidazole for 7 days. Patient to follow-up with her PCP in 3 days if symptoms have not resolved.  Labs were viewed by myself  incorporated into medical decision making.  Discussed pertinent finding with patient or caregiver prior to discharge with no further questions.  Immediate return precautions given and understood.  Medical decision making supervised by my attending Dr. Patria Mane.   Tery Sanfilippo, MD PGY-3 Emergency Medicine   Final Clinical Impressions(s) / ED Diagnoses   Final diagnoses:  Nausea vomiting and diarrhea    New Prescriptions Discharge Medication List as of 07/31/2015 11:10 AM    START taking these medications   Details  metroNIDAZOLE (FLAGYL) 500 MG tablet Take 1  tablet (500 mg total) by mouth 2 (two) times daily., Starting Tue 07/31/2015, Print    ondansetron (ZOFRAN ODT) 4 MG disintegrating tablet Take 1 tablet (4 mg total) by mouth every 8 (eight) hours as needed for nausea or vomiting., Starting Tue 07/31/2015, Print         Tery Sanfilippo, MD 07/31/15 1652    Azalia Bilis, MD 07/31/15 1655

## 2015-07-31 NOTE — ED Notes (Signed)
Pelvic Cart set up and ready at bedside 

## 2015-08-01 LAB — GC/CHLAMYDIA PROBE AMP (~~LOC~~) NOT AT ARMC
Chlamydia: NEGATIVE
Neisseria Gonorrhea: NEGATIVE

## 2016-01-08 ENCOUNTER — Encounter (HOSPITAL_COMMUNITY): Payer: Self-pay

## 2016-01-08 ENCOUNTER — Emergency Department (HOSPITAL_COMMUNITY)
Admission: EM | Admit: 2016-01-08 | Discharge: 2016-01-08 | Disposition: A | Discharge status: Home / Self Care | Payer: Self-pay | Attending: Emergency Medicine | Admitting: Emergency Medicine

## 2016-01-08 DIAGNOSIS — F17210 Nicotine dependence, cigarettes, uncomplicated: Secondary | ICD-10-CM | POA: Insufficient documentation

## 2016-01-08 DIAGNOSIS — Y929 Unspecified place or not applicable: Secondary | ICD-10-CM | POA: Insufficient documentation

## 2016-01-08 DIAGNOSIS — Y939 Activity, unspecified: Secondary | ICD-10-CM | POA: Insufficient documentation

## 2016-01-08 DIAGNOSIS — S01401A Unspecified open wound of right cheek and temporomandibular area, initial encounter: Secondary | ICD-10-CM | POA: Insufficient documentation

## 2016-01-08 DIAGNOSIS — J45909 Unspecified asthma, uncomplicated: Secondary | ICD-10-CM | POA: Insufficient documentation

## 2016-01-08 DIAGNOSIS — Y999 Unspecified external cause status: Secondary | ICD-10-CM | POA: Insufficient documentation

## 2016-01-08 DIAGNOSIS — Z79899 Other long term (current) drug therapy: Secondary | ICD-10-CM | POA: Insufficient documentation

## 2016-01-08 MED ORDER — NAPROXEN 375 MG PO TABS: 375.0000 mg | ORAL_TABLET | Freq: Two times a day (BID) | ORAL | 0 refills | Status: AC
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
## 2016-01-08 NOTE — Discharge Instructions (Signed)
Apply ice as needed over the next few days.   I have prescribed an anti-inflammatory medication to take over the next five days. This should help with your pain and swelling.
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
## 2016-05-20 ENCOUNTER — Encounter (HOSPITAL_COMMUNITY): Payer: Self-pay | Admitting: Emergency Medicine

## 2016-05-20 DIAGNOSIS — M62830 Muscle spasm of back: Secondary | ICD-10-CM | POA: Insufficient documentation

## 2016-05-20 DIAGNOSIS — F17210 Nicotine dependence, cigarettes, uncomplicated: Secondary | ICD-10-CM | POA: Insufficient documentation

## 2016-05-20 DIAGNOSIS — J45909 Unspecified asthma, uncomplicated: Secondary | ICD-10-CM | POA: Insufficient documentation

## 2016-05-20 NOTE — ED Triage Notes (Signed)
Patient arrives with complaint of 1 week of lower back pain. Points to thoracic and states it continues into lumbar. Endorses standing for long hours at work. Also reports intermittent paresthesia in right arm. 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 Denies currently paresthesia in right arm. Only complaining of lower back pain at this time.

## 2016-05-21 ENCOUNTER — Emergency Department (HOSPITAL_COMMUNITY)
Admission: EM | Admit: 2016-05-21 | Discharge: 2016-05-21 | Disposition: A | Discharge status: Home / Self Care | Payer: Self-pay | Attending: Emergency Medicine | Admitting: Emergency Medicine

## 2016-05-21 DIAGNOSIS — M62830 Muscle spasm of back: Secondary | ICD-10-CM

## 2016-05-21 MED ORDER — HYDROCODONE-ACETAMINOPHEN 5-325 MG PO TABS
1.0000 | ORAL_TABLET | Freq: Once | ORAL | Status: AC
  Administered 2016-05-21: 1 via ORAL
  Filled 2016-05-21: qty 1

## 2016-05-21 MED ORDER — CYCLOBENZAPRINE HCL 10 MG PO TABS
10.0000 mg | ORAL_TABLET | Freq: Once | ORAL | Status: AC
  Administered 2016-05-21: 10 mg via ORAL
  Filled 2016-05-21: qty 1

## 2016-05-21 MED ORDER — DICLOFENAC SODIUM 50 MG PO TBEC: 50.0000 mg | DELAYED_RELEASE_TABLET | Freq: Two times a day (BID) | ORAL | 0 refills | Status: DC

## 2016-05-21 MED ORDER — CYCLOBENZAPRINE HCL 10 MG PO TABS: 10.0000 mg | ORAL_TABLET | Freq: Two times a day (BID) | ORAL | 0 refills | Status: DC | PRN

## 2016-05-21 NOTE — Discharge Instructions (Signed)
Do not drive while taking the muscle relaxant as it will make you sleepy. °

## 2016-05-21 NOTE — ED Provider Notes (Signed)
[facility] Provider Note   CSN: [id number] Arrival date & time: 05/20/16  2250     History   Chief Complaint Chief Complaint  Patient presents with  . Back Pain    HPI Sherri Reid is a 41 y.o. female who presents to the ED with back pain. 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 She works 8 hour days. She has no known injury to her back. Patient reports taking ibuprofen for pain but it has not helped.   The history is provided by the patient. No language interpreter was used.  Back Pain   This is a new problem. The current episode started more than 2 days ago. The problem occurs constantly. The problem has been gradually worsening. The pain is associated with no known injury. The pain is present in the lumbar spine. The quality of the pain is described as aching. The pain does not radiate. The pain is at a severity of 9/10. The symptoms are aggravated by bending and twisting. The pain is the same all the time. Pertinent negatives include no chest pain, no fever, no numbness, no headaches, no abdominal pain, no bowel incontinence, no bladder incontinence, no dysuria and no weakness.    Past Medical History:  Diagnosis Date  . Asthma   . Infection    UTI  . Syphilis    treated  . Vaginal Pap smear, abnormal    f/u ok    There are no active problems to display for this patient.   Past Surgical History:  Procedure Laterality Date  . NO PAST SURGERIES      OB History    Gravida Para Term Preterm AB Living   1 0 0 0 1 0   SAB TAB Ectopic Multiple Live Births   1 0 0 0         Home Medications    Prior to Admission medications   Medication Sig Start Date End Date Taking? Authorizing Provider  albuterol (PROVENTIL HFA;VENTOLIN HFA) 108 (90 BASE) MCG/ACT inhaler Inhale 2 puffs into the lungs every 4 (four) hours as needed for wheezing or shortness of breath. 03/13/14   Horton, Mayer Maskerourtney F, MD  cyclobenzaprine (FLEXERIL) 10 MG tablet Take 1 tablet (10  mg total) by mouth 2 (two) times daily as needed for muscle spasms. 05/21/16   Janne NapoleonNeese, Naysha Sholl M, NP  diclofenac (VOLTAREN) 50 MG EC tablet Take 1 tablet (50 mg total) by mouth 2 (two) times daily. 05/21/16   Janne NapoleonNeese, Mateya Torti M, NP  ibuprofen (ADVIL,MOTRIN) 800 MG tablet Take 1 tablet (800 mg total) by mouth 3 (three) times daily as needed for moderate pain. 04/25/15   Mesner, Barbara CowerJason, MD  metroNIDAZOLE (FLAGYL) 500 MG tablet Take 1 tablet (500 mg total) by mouth 2 (two) times daily. 07/31/15   Tery Sanfilippoiester, Matthew, MD  ondansetron (ZOFRAN ODT) 4 MG disintegrating tablet Take 1 tablet (4 mg total) by mouth every 8 (eight) hours as needed for nausea or vomiting. 07/31/15   Tery Sanfilippoiester, Matthew, MD    Family History Family History  Problem Relation Age of Onset  . Hyperlipidemia Mother   . Mental illness Mother   . Hypertension Mother   . Diabetes Father   . Cancer Maternal Aunt   . Cancer Paternal Uncle   . Anesthesia problems Neg Hx     Social History Social History  Substance Use Topics  . Smoking status: Current Every Day Smoker    Packs/day: 1.00    Years: 22.00  Types: Cigarettes  . Smokeless tobacco: Never Used  . Alcohol use Yes     Comment: occasionally     Allergies   Penicillins   Review of Systems Review of Systems  Constitutional: Negative for chills and fever.  HENT: Negative.   Eyes: Negative for visual disturbance.  Respiratory: Negative for shortness of breath.   Cardiovascular: Negative for chest pain.  Gastrointestinal: Negative for abdominal pain, bowel incontinence, nausea and vomiting.  Genitourinary: Negative for bladder incontinence, dysuria, frequency and urgency.  Musculoskeletal: Positive for back pain. Negative for neck pain.  Skin: Negative for rash and wound.  Allergic/Immunologic: Negative for immunocompromised state.  Neurological: Negative for weakness, numbness and headaches.  Psychiatric/Behavioral: Negative for confusion. The patient is not  nervous/anxious.      Physical Exam Updated Vital Signs BP (!) 170/95 (BP Location: Right Arm)   Pulse 70   Temp 98.5 F (36.9 C) (Oral)   Resp 16   LMP 05/16/2016 (Exact Date)   SpO2 100%   Physical Exam  Constitutional: She is oriented to person, place, and time. She appears well-developed and well-nourished. No distress.  HENT:  Head: Normocephalic and atraumatic.  Right Ear: Tympanic membrane normal.  Left Ear: Tympanic membrane normal.  Nose: Nose normal.  Mouth/Throat: Uvula is midline, oropharynx is clear and moist and mucous membranes are normal.  Eyes: EOM are normal.  Neck: Normal range of motion. Neck supple.  Cardiovascular: Normal rate and regular rhythm.   Pulmonary/Chest: Effort normal. She has no wheezes. She has no rales.  Abdominal: Soft. Bowel sounds are normal. There is no tenderness.  Musculoskeletal:       Thoracic back: She exhibits tenderness and spasm. She exhibits normal pulse. Decreased range of motion: due to pain.       Lumbar back: She exhibits normal pulse.       Back:  Neurological: She is alert and oriented to person, place, and time. She has normal strength. No cranial nerve deficit or sensory deficit. Gait normal.  Reflex Scores:      Bicep reflexes are 2+ on the right side and 2+ on the left side.      Brachioradialis reflexes are 2+ on the right side and 2+ on the left side.      Patellar reflexes are 2+ on the right side and 2+ on the left side.      Achilles reflexes are 2+ on the right side and 2+ on the left side. Skin: Skin is warm and dry.  Psychiatric: She has a normal mood and affect. Her behavior is normal.  Nursing note and vitals reviewed.    ED Treatments / Results  Labs (all labs ordered are listed, but only abnormal results are displayed) Labs Reviewed - No data to display  Radiology No results found.  Procedures Procedures (including critical care time)  Medications Ordered in ED Medications  cyclobenzaprine  (FLEXERIL) tablet 10 mg (not administered)  HYDROcodone-acetaminophen (NORCO/VICODIN) 5-325 MG per tablet 1 tablet (not administered)     Initial Impression / Assessment and Plan / ED Course  I have reviewed the triage vital signs and the nursing notes.   Final Clinical Impressions(s) / ED Diagnoses  41 y.o. female with right side thoracic back pain stable for d/c without neuro deficits. Will treat with muscle relaxant and NSAIDS. Patient to f/u with ortho or return here for worsening symptoms.   Final diagnoses:  Spasm of thoracic back muscle    New Prescriptions New Prescriptions  CYCLOBENZAPRINE (FLEXERIL) 10 MG TABLET    Take 1 tablet (10 mg total) by mouth 2 (two) times daily as needed for muscle spasms.   DICLOFENAC (VOLTAREN) 50 MG EC TABLET    Take 1 tablet (50 mg total) by mouth 2 (two) times daily.     Kerrie Buffalo [place], [place] 05/21/16 Jackey Loge    Dione Booze, MD 05/21/16 [phone]

## 2016-07-11 ENCOUNTER — Encounter (HOSPITAL_COMMUNITY): Payer: Self-pay | Admitting: Emergency Medicine

## 2016-07-11 ENCOUNTER — Emergency Department (HOSPITAL_COMMUNITY)
Admission: EM | Admit: 2016-07-11 | Discharge: 2016-07-11 | Disposition: A | Discharge status: Home / Self Care | Payer: Self-pay | Attending: Emergency Medicine | Admitting: Emergency Medicine

## 2016-07-11 DIAGNOSIS — J45909 Unspecified asthma, uncomplicated: Secondary | ICD-10-CM | POA: Insufficient documentation

## 2016-07-11 DIAGNOSIS — F17210 Nicotine dependence, cigarettes, uncomplicated: Secondary | ICD-10-CM | POA: Insufficient documentation

## 2016-07-11 DIAGNOSIS — H60503 Unspecified acute noninfective otitis externa, bilateral: Secondary | ICD-10-CM | POA: Insufficient documentation

## 2016-07-11 DIAGNOSIS — Z79899 Other long term (current) drug therapy: Secondary | ICD-10-CM | POA: Insufficient documentation

## 2016-07-11 MED ORDER — NEOMYCIN-POLYMYXIN-HC 3.5-10000-1 OT SUSP: 4.0000 [drp] | Freq: Four times a day (QID) | OTIC | 0 refills | Status: AC

## 2016-07-11 NOTE — ED Triage Notes (Signed)
Pt c/o L ear pain x 2 days, R ear pain onset today.  Denies fever

## 2016-07-11 NOTE — Discharge Instructions (Signed)
Medications: Cortisporin  Treatment: Use 4 Cortisporin drops in both ears 4 times daily for 7 days. You can take ibuprofen and/or Tylenol as prescribed over-the-counter, as needed for your pain.  Follow-up: If your symptoms are not improving, please follow-up with Dr. Jearld FentonByers, an ear nose and throat doctor. Please return to the emergency department if you develop any new or worsening symptoms. Please follow up and establish care with a primary care provider for further evaluation and recheck of your blood pressure.

## 2016-07-11 NOTE — ED Provider Notes (Signed)
[facility] Provider Note   CSN: [id number] Arrival date & time: 07/11/16  2205  By signing my name below, I, Sherri Reid, attest that this documentation has been prepared under the direction and in the presence of [employer]. Electronically Signed: Cynda Reid, Scribe. 07/11/16. 11:21 PM.  History   Chief Complaint Chief Complaint  Patient presents with  . Otalgia    HPI Comments: Sherri Reid is a 41 y.o. female with no pertinent past medical history, who presents to the Emergency Department complaining of sudden-onset, constant left ear pain that began two days ago. Patient states she has had gradually worsening ear pain. Patient states initially she only had left ear pain, but the pain has now radiated into her right ear. Patient does report inserting earphones in her ears a lot lately. Patient reports an associated sore throat. Patient reports taking ibuprofen with no relief. Patient is allergic to penicillin. Patient denies any recent swimming or using Q-tips. Patient denies any fever, chills, congestion, cough, ear discharge, or any additional symptoms.   The history is provided by the patient. No language interpreter was used.    Past Medical History:  Diagnosis Date  . Asthma   . Infection    UTI  . Syphilis    treated  . Vaginal Pap smear, abnormal    f/u ok    There are no active problems to display for this patient.   Past Surgical History:  Procedure Laterality Date  . NO PAST SURGERIES      OB History    Gravida Para Term Preterm AB Living   1 0 0 0 1 0   SAB TAB Ectopic Multiple Live Births   1 0 0 0         Home Medications    Prior to Admission medications   Medication Sig Start Date End Date Taking? Authorizing Provider  albuterol (PROVENTIL HFA;VENTOLIN HFA) 108 (90 BASE) MCG/ACT inhaler Inhale 2 puffs into the lungs every 4 (four) hours as needed for wheezing or shortness of breath. 03/13/14   Horton, Mayer Masker, MD    cyclobenzaprine (FLEXERIL) 10 MG tablet Take 1 tablet (10 mg total) by mouth 2 (two) times daily as needed for muscle spasms. 05/21/16   Janne Napoleon, NP  diclofenac (VOLTAREN) 50 MG EC tablet Take 1 tablet (50 mg total) by mouth 2 (two) times daily. 05/21/16   Janne Napoleon, NP  ibuprofen (ADVIL,MOTRIN) 800 MG tablet Take 1 tablet (800 mg total) by mouth 3 (three) times daily as needed for moderate pain. 04/25/15   Mesner, Barbara Cower, MD  metroNIDAZOLE (FLAGYL) 500 MG tablet Take 1 tablet (500 mg total) by mouth 2 (two) times daily. 07/31/15   Tery Sanfilippo, MD  neomycin-polymyxin-hydrocortisone (CORTISPORIN) 3.5-10000-1 OTIC suspension Place 4 drops into both ears 4 (four) times daily. 07/11/16 07/18/16  Emi Holes, PA-C  ondansetron (ZOFRAN ODT) 4 MG disintegrating tablet Take 1 tablet (4 mg total) by mouth every 8 (eight) hours as needed for nausea or vomiting. 07/31/15   Tery Sanfilippo, MD    Family History Family History  Problem Relation Age of Onset  . Hyperlipidemia Mother   . Mental illness Mother   . Hypertension Mother   . Diabetes Father   . Cancer Maternal Aunt   . Cancer Paternal Uncle   . Anesthesia problems Neg Hx     Social History Social History  Substance Use Topics  . Smoking status: Current Every Day Smoker  Packs/day: 1.00    Years: 22.00    Types: Cigarettes  . Smokeless tobacco: Never Used  . Alcohol use Yes     Comment: occasionally     Allergies   Penicillins   Review of Systems Review of Systems  Constitutional: Negative for chills and fever.  HENT: Positive for ear pain and sore throat. Negative for congestion and ear discharge.   Respiratory: Negative for cough.      Physical Exam Updated Vital Signs BP (!) 179/98 (BP Location: Left Arm)   Pulse 72   Temp 98.7 F (37.1 C) (Oral)   Resp 18   Ht 5\' 2"  (1.575 m)   Wt 113.4 kg (250 lb)   LMP 07/09/2016   SpO2 100%   BMI 45.73 kg/m   Physical Exam  Constitutional: She appears  well-developed and well-nourished. No distress.  HENT:  Head: Normocephalic and atraumatic.  Right Ear: Tympanic membrane normal. No mastoid tenderness.  Left Ear: Tympanic membrane normal. No mastoid tenderness.  Mouth/Throat: Oropharynx is clear and moist. No oropharyngeal exudate.  Erythematous external canals bilaterally, worse on the left. Internal TMs clear bilaterally. Movement pain present.  Eyes: Conjunctivae are normal. Pupils are equal, round, and reactive to light. Right eye exhibits no discharge. Left eye exhibits no discharge. No scleral icterus.  Neck: Normal range of motion. Neck supple. No thyromegaly present.  Cardiovascular: Normal rate, regular rhythm, normal heart sounds and intact distal pulses.  Exam reveals no gallop and no friction rub.   No murmur heard. Pulmonary/Chest: Effort normal and breath sounds normal. No stridor. No respiratory distress. She has no wheezes. She has no rales.  Lymphadenopathy:    She has no cervical adenopathy.  Neurological: She is alert. Coordination normal.  Skin: Skin is warm and dry. No rash noted. She is not diaphoretic. No pallor.  Psychiatric: She has a normal mood and affect.  Nursing note and vitals reviewed.    ED Treatments / Results  DIAGNOSTIC STUDIES: Oxygen Saturation is 100% on RA, normal by my interpretation.    COORDINATION OF CARE: 11:20 PM Discussed treatment plan with pt at bedside and pt agreed to plan, which includes antibiotic drops.   Labs (all labs ordered are listed, but only abnormal results are displayed) Labs Reviewed - No data to display  EKG  EKG Interpretation None       Radiology No results found.  Procedures Procedures (including critical care time)  Medications Ordered in ED Medications - No data to display   Initial Impression / Assessment and Plan / ED Course  I have reviewed the triage vital signs and the nursing notes.  Pertinent labs & imaging results that were available  during my care of the patient were reviewed by me and considered in my medical decision making (see chart for details).     Pt presenting with otitis externa. Pt afebrile in NAD. Exam not concerning for mastoiditis, cellulitis or malignant OE. Discharge with cortisporin. Patient advised to stop using in-ear headphones.  Advised follow up with ENT in 2-3 days if no improvement.  Return precautions discussed. Pt appears safe for discharge. Advised patient to establish care with a primary care provider for recheck and further evaluation of her blood pressure.   Final Clinical Impressions(s) / ED Diagnoses   Final diagnoses:  Acute otitis externa of both ears, unspecified type    New Prescriptions New Prescriptions   NEOMYCIN-POLYMYXIN-HYDROCORTISONE (CORTISPORIN) 3.5-10000-1 OTIC SUSPENSION    Place 4 drops into both ears 4 (  four) times daily.  I personally performed the services described in this documentation, which was scribed in my presence. The recorded information has been reviewed and is accurate.     Emi HolesLaw, Alaija Ruble M, PA-C 07/11/16 2330    Arby BarrettePfeiffer, Marcy, MD 07/23/16 2008

## 2016-09-04 ENCOUNTER — Emergency Department (HOSPITAL_COMMUNITY)
Admission: EM | Admit: 2016-09-04 | Discharge: 2016-09-04 | Disposition: A | Discharge status: Home / Self Care | Payer: Self-pay | Attending: Emergency Medicine | Admitting: Emergency Medicine

## 2016-09-04 DIAGNOSIS — R51 Headache: Secondary | ICD-10-CM | POA: Insufficient documentation

## 2016-09-04 DIAGNOSIS — J45909 Unspecified asthma, uncomplicated: Secondary | ICD-10-CM | POA: Insufficient documentation

## 2016-09-04 DIAGNOSIS — M545 Low back pain: Secondary | ICD-10-CM | POA: Insufficient documentation

## 2016-09-04 DIAGNOSIS — Z79899 Other long term (current) drug therapy: Secondary | ICD-10-CM | POA: Insufficient documentation

## 2016-09-04 DIAGNOSIS — R55 Syncope and collapse: Secondary | ICD-10-CM | POA: Insufficient documentation

## 2016-09-04 DIAGNOSIS — F17210 Nicotine dependence, cigarettes, uncomplicated: Secondary | ICD-10-CM | POA: Insufficient documentation

## 2016-09-04 LAB — URINALYSIS, ROUTINE W REFLEX MICROSCOPIC
BILIRUBIN URINE: NEGATIVE
Bacteria, UA: NONE SEEN
GLUCOSE, UA: NEGATIVE mg/dL
KETONES UR: NEGATIVE mg/dL
LEUKOCYTES UA: NEGATIVE
Nitrite: NEGATIVE
PROTEIN: NEGATIVE mg/dL
Specific Gravity, Urine: 1.009 (ref 1.005–1.030)
Squamous Epithelial / LPF: NONE SEEN
pH: 6 (ref 5.0–8.0)

## 2016-09-04 LAB — CBC
HCT: 37.5 % (ref 36.0–46.0)
HEMOGLOBIN: 12.1 g/dL (ref 12.0–15.0)
MCH: 26.4 pg (ref 26.0–34.0)
MCHC: 32.3 g/dL (ref 30.0–36.0)
MCV: 81.9 fL (ref 78.0–100.0)
Platelets: 284 10*3/uL (ref 150–400)
RBC: 4.58 MIL/uL (ref 3.87–5.11)
RDW: 13.4 % (ref 11.5–15.5)
WBC: 5.4 10*3/uL (ref 4.0–10.5)

## 2016-09-04 LAB — BASIC METABOLIC PANEL
ANION GAP: 6 (ref 5–15)
BUN: 7 mg/dL (ref 6–20)
CHLORIDE: 108 mmol/L (ref 101–111)
CO2: 25 mmol/L (ref 22–32)
Calcium: 8.4 mg/dL — ABNORMAL LOW (ref 8.9–10.3)
Creatinine, Ser: 0.92 mg/dL (ref 0.44–1.00)
GFR calc Af Amer: >60 mL/min (ref >60)
GFR calc non Af Amer: >60 mL/min (ref >60)
Glucose, Bld: 89 mg/dL (ref 65–99)
POTASSIUM: 3.8 mmol/L (ref 3.5–5.1)
SODIUM: 139 mmol/L (ref 135–145)

## 2016-09-04 LAB — RAPID URINE DRUG SCREEN, HOSP PERFORMED
Amphetamines: NOT DETECTED
BENZODIAZEPINES: NOT DETECTED
Barbiturates: NOT DETECTED
Cocaine: NOT DETECTED
Opiates: NOT DETECTED
Tetrahydrocannabinol: NOT DETECTED

## 2016-09-04 LAB — POC URINE PREG, ED: PREG TEST UR: NEGATIVE

## 2016-09-04 MED ORDER — SODIUM CHLORIDE 0.9 % IV BOLUS (SEPSIS)
500.0000 mL | Freq: Once | INTRAVENOUS | Status: AC
  Administered 2016-09-04: 500 mL via INTRAVENOUS

## 2016-09-04 MED ORDER — KETOROLAC TROMETHAMINE 15 MG/ML IJ SOLN
15.0000 mg | Freq: Once | INTRAMUSCULAR | Status: AC
  Administered 2016-09-04: 15 mg via INTRAVENOUS
  Filled 2016-09-04: qty 1

## 2016-09-04 NOTE — ED Notes (Signed)
Walked patient to the bathroom patient did well 

## 2016-09-04 NOTE — ED Notes (Signed)
Pt would like something for a ha.

## 2016-09-04 NOTE — Discharge Instructions (Signed)
Is very important and that you stay well-hydrated of the next several days. Drink lots of water.  Be careful when going from sitting to standing, as this can increase your symptoms.  You may use ibuprofen as needed for headache.  Follow-up with [facility] in one week as needed for evaluation of your symptoms. Return to emergency room if you develop fevers, chills, vision changes, vomiting, or any new or worsening symptoms.

## 2016-09-04 NOTE — ED Triage Notes (Signed)
Pt arrives via EMS from workplace where pt reports days of prior headache. WEnt on to work, got out of the car feeling lightheaded/dizzy. Per bystanders had some jerking of extremities. No prior hx of seizures. For EMs pt was not post ictal, responding to exam when trying to open patients eyes. Currently awake, alert, oriented x4, c/o headache.

## 2016-09-04 NOTE — ED Notes (Signed)
PA at bedside.

## 2016-09-04 NOTE — ED Provider Notes (Signed)
[facility]-EMERGENCY DEPT Provider Note   CSN: [id number] Arrival date & time: 09/04/16  1014     History   Chief Complaint Chief Complaint  Patient presents with  . Seizures    HPI Sherri Reid is a 41 y.o. female presenting with complaint of syncope.  Patient states that she stood up from a car, and experienced some lightheadedness and dizziness. She subsequently passed out. Patient's friend was with her, and provided the rest of the history. Patient's friend states that patient passed out, and landed face first on the ground. Friend reports patient's eyes were moving rapidly, and reports some tremors of her hands. No jerking movements. Patient's friend states is happened for 10 minutes until EMS arrived. EMS states that patient was not postictal on arrival. While assessing, EMS felt patient was was deliberately squeezing her eyes shut while trying to assess eye movements.  Currently patient reports left-sided headache and some low back pain bilaterally. She was ambulatory on scene without difficulty. She denies vision changes, decreased concentration, slurred speech, nausea, vomiting, abdominal pain, chest pain, shortness of breath, numbness, tingling, or loss of bowel or bladder control. She denies recent fever, chills, cough, or sore throat. She denies urinary symptoms or abnormal bowel movements. She last ate last night. Patient states she smokes, has not had anything to drink recently, and does not use other drugs.  HPI  Past Medical History:  Diagnosis Date  . Asthma   . Infection    UTI  . Syphilis    treated  . Vaginal Pap smear, abnormal    f/u ok    There are no active problems to display for this patient.   Past Surgical History:  Procedure Laterality Date  . NO PAST SURGERIES      OB History    Gravida Para Term Preterm AB Living   1 0 0 0 1 0   SAB TAB Ectopic Multiple Live Births   1 0 0 0         Home Medications    Prior to Admission medications     Medication Sig Start Date End Date Taking? Authorizing Provider  albuterol (PROVENTIL HFA;VENTOLIN HFA) 108 (90 BASE) MCG/ACT inhaler Inhale 2 puffs into the lungs every 4 (four) hours as needed for wheezing or shortness of breath. 03/13/14   Horton, Mayer Masker, MD  cyclobenzaprine (FLEXERIL) 10 MG tablet Take 1 tablet (10 mg total) by mouth 2 (two) times daily as needed for muscle spasms. 05/21/16   Janne Napoleon, NP  diclofenac (VOLTAREN) 50 MG EC tablet Take 1 tablet (50 mg total) by mouth 2 (two) times daily. 05/21/16   Janne Napoleon, NP  ibuprofen (ADVIL,MOTRIN) 800 MG tablet Take 1 tablet (800 mg total) by mouth 3 (three) times daily as needed for moderate pain. 04/25/15   Mesner, Barbara Cower, MD  metroNIDAZOLE (FLAGYL) 500 MG tablet Take 1 tablet (500 mg total) by mouth 2 (two) times daily. 07/31/15   Tery Sanfilippo, MD  ondansetron (ZOFRAN ODT) 4 MG disintegrating tablet Take 1 tablet (4 mg total) by mouth every 8 (eight) hours as needed for nausea or vomiting. 07/31/15   Tery Sanfilippo, MD    Family History Family History  Problem Relation Age of Onset  . Hyperlipidemia Mother   . Mental illness Mother   . Hypertension Mother   . Diabetes Father   . Cancer Maternal Aunt   . Cancer Paternal Uncle   . Anesthesia problems Neg Hx     Social  History Social History  Substance Use Topics  . Smoking status: Current Every Day Smoker    Packs/day: 1.00    Years: 22.00    Types: Cigarettes  . Smokeless tobacco: Never Used  . Alcohol use Yes     Comment: occasionally     Allergies   Penicillins   Review of Systems Review of Systems  Musculoskeletal: Positive for back pain.  Neurological: Positive for light-headedness and headaches.  All other systems reviewed and are negative.    Physical Exam Updated Vital Signs BP 127/65   Pulse 63   Temp 98.3 F (36.8 C) (Oral)   Resp 20   Wt 113.4 kg (250 lb)   SpO2 100%   BMI 45.73 kg/m   Physical Exam  Constitutional: She is  oriented to person, place, and time. She appears well-developed and well-nourished. No distress.  HENT:  Head: Normocephalic and atraumatic.  Right Ear: Tympanic membrane, external ear and ear canal normal.  Left Ear: Tympanic membrane, external ear and ear canal normal.  Nose: Nose normal.  Mouth/Throat: Uvula is midline, oropharynx is clear and moist and mucous membranes are normal.  No obvious lacerations, abrasions, contusions, or hematomas seen on the head of the scalp. Tenderness to palpation of left forehead.   Eyes: Pupils are equal, round, and reactive to light. EOM are normal.  Neck: Normal range of motion. Neck supple.  Full ROM of head and neck without pain. No tenderness to palpation of midline cervical spine.  Cardiovascular: Normal rate, regular rhythm and intact distal pulses.   Pulmonary/Chest: Effort normal and breath sounds normal. She exhibits no tenderness.  Abdominal: Soft. She exhibits no distension. There is no tenderness. There is no guarding.  Musculoskeletal: Normal range of motion. She exhibits no tenderness.  Neurological: She is alert and oriented to person, place, and time. She has normal strength. No cranial nerve deficit or sensory deficit. Coordination and gait normal. GCS eye subscore is 4. GCS verbal subscore is 5. GCS motor subscore is 6.  Fine movement and coordination intact  Skin: Skin is warm.  Psychiatric: She has a normal mood and affect.  Nursing note and vitals reviewed.    ED Treatments / Results  Labs (all labs ordered are listed, but only abnormal results are displayed) Labs Reviewed  BASIC METABOLIC PANEL - Abnormal; Notable for the following:       Result Value   Calcium 8.4 (*)    All other components within normal limits  URINALYSIS, ROUTINE W REFLEX MICROSCOPIC - Abnormal; Notable for the following:    APPearance HAZY (*)    Hgb urine dipstick MODERATE (*)    All other components within normal limits  CBC  RAPID URINE DRUG  SCREEN, HOSP PERFORMED  POC URINE PREG, ED    EKG  EKG Interpretation  Date/Time:  Thursday September 04 2016 10:19:13 EDT Ventricular Rate:  76 PR Interval:    QRS Duration: 80 QT Interval:  430 QTC Calculation: 484 R Axis:   69 Text Interpretation:  Sinus rhythm No significant change since last tracing Confirmed by Frederick PeersLittle, Rachel [id number]) on 09/04/2016 10:35:34 AM       Radiology No results found.  Procedures Procedures (including critical care time)  Medications Ordered in ED Medications  sodium chloride 0.9 % bolus 500 mL (0 mLs Intravenous Stopped 09/04/16 1313)  ketorolac (TORADOL) 15 MG/ML injection 15 mg (15 mg Intravenous Given 09/04/16 1313)     Initial Impression / Assessment and Plan / ED Course  I have reviewed the triage vital signs and the nursing notes.  Pertinent labs & imaging results that were available during my care of the patient were reviewed by me and considered in my medical decision making (see chart for details).     Patient presenting with frontal headache after syncopal episode. Patient felt lightheaded and dizzy prior to syncope after going from sitting to standing. Friend reports 10 minute syncopal episode, but question time line. Friend states twitching movements of arm did not last more than a minute. EMS states patient was not postictal on arrival. Physical exam shows patient is alert and oriented with no neurologic deficits. No obvious head injury, laceration, abrasion, contusion. I do not believe CT head is necessary, low risk per [place] head CT guidelines. Will order basic labs, IV fluids, and ketorolac.  UA negative for infection. Pregnancy test negative. UDS negative. CBC reassuring, no leukocytosis or anemia. BMP shows no signs of dehydration. EKG reassuring. Patient states her symptoms are improved and she has decreased pain. Discussed case with attending, Dr. Clarene Duke agrees to plan. Likely orthostatic or vagal cause of syncope. At this  time, patient appears safe for discharge. Patient to follow-up with [facility]. Strict return precautions given. Patient states she understands and agrees to plan.  Final Clinical Impressions(s) / ED Diagnoses   Final diagnoses:  Syncope, unspecified syncope type    New Prescriptions Discharge Medication List as of 09/04/2016  2:01 PM       Alveria Apley, PA-C 09/05/16 2115    Little, Ambrose Finland, MD 09/08/16 1313

## 2016-09-04 NOTE — ED Notes (Signed)
Pt ambulated to and from restroom without difficulty.   

## 2016-09-04 NOTE — ED Notes (Signed)
Got patient into a gown on the monitor did ekg shown to Dr Clarene DukeLittle

## 2016-10-16 ENCOUNTER — Other Ambulatory Visit: Payer: Self-pay | Admitting: Obstetrics & Gynecology

## 2016-10-16 DIAGNOSIS — Z1231 Encounter for screening mammogram for malignant neoplasm of breast: Secondary | ICD-10-CM

## 2016-10-31 ENCOUNTER — Inpatient Hospital Stay: Admission: RE | Admit: 2016-10-31 | Payer: Self-pay | Source: Ambulatory Visit

## 2017-01-14 ENCOUNTER — Encounter (HOSPITAL_COMMUNITY): Payer: Self-pay | Admitting: Family Medicine

## 2017-01-14 ENCOUNTER — Emergency Department (HOSPITAL_COMMUNITY)
Admission: EM | Admit: 2017-01-14 | Discharge: 2017-01-15 | Disposition: A | Discharge status: Home / Self Care | Payer: Self-pay | Attending: Emergency Medicine | Admitting: Emergency Medicine

## 2017-01-14 DIAGNOSIS — J069 Acute upper respiratory infection, unspecified: Secondary | ICD-10-CM | POA: Insufficient documentation

## 2017-01-14 DIAGNOSIS — J4521 Mild intermittent asthma with (acute) exacerbation: Secondary | ICD-10-CM | POA: Insufficient documentation

## 2017-01-14 DIAGNOSIS — Z720 Tobacco use: Secondary | ICD-10-CM

## 2017-01-14 DIAGNOSIS — F17210 Nicotine dependence, cigarettes, uncomplicated: Secondary | ICD-10-CM | POA: Insufficient documentation

## 2017-01-14 LAB — RAPID STREP SCREEN (MED CTR MEBANE ONLY): Streptococcus, Group A Screen (Direct): NEGATIVE

## 2017-01-14 MED ORDER — ALBUTEROL SULFATE HFA 108 (90 BASE) MCG/ACT IN AERS
1.0000 | INHALATION_SPRAY | RESPIRATORY_TRACT | Status: DC | PRN
  Administered 2017-01-15: 2 via RESPIRATORY_TRACT
  Filled 2017-01-14: qty 6.7

## 2017-01-14 MED ORDER — IPRATROPIUM-ALBUTEROL 0.5-2.5 (3) MG/3ML IN SOLN
3.0000 mL | Freq: Once | RESPIRATORY_TRACT | Status: AC
  Administered 2017-01-15: 3 mL via RESPIRATORY_TRACT
  Filled 2017-01-14: qty 3

## 2017-01-14 MED ORDER — KETOROLAC TROMETHAMINE 30 MG/ML IJ SOLN
30.0000 mg | Freq: Once | INTRAMUSCULAR | Status: AC
  Administered 2017-01-15: 30 mg via INTRAMUSCULAR
  Filled 2017-01-14: qty 1

## 2017-01-14 MED ORDER — PREDNISONE 20 MG PO TABS
60.0000 mg | ORAL_TABLET | Freq: Once | ORAL | Status: AC
  Administered 2017-01-15: 60 mg via ORAL
  Filled 2017-01-14: qty 3

## 2017-01-14 NOTE — ED Triage Notes (Signed)
Patient reports she is experiencing upper respiratory symptoms of a productive cough, right ear pain, nasal drainage, sore throat, and unknown measured fever.

## 2017-01-15 ENCOUNTER — Emergency Department (HOSPITAL_COMMUNITY): Payer: Self-pay

## 2017-01-15 MED ORDER — IBUPROFEN 600 MG PO TABS: 600.0000 mg | ORAL_TABLET | Freq: Four times a day (QID) | ORAL | 0 refills | Status: DC | PRN

## 2017-01-15 MED ORDER — PREDNISONE 10 MG (21) PO TBPK: ORAL_TABLET | ORAL | 0 refills | Status: DC

## 2017-01-15 MED ORDER — AEROCHAMBER PLUS FLO-VU MEDIUM MISC
1.0000 | Freq: Once | Status: AC
  Administered 2017-01-15: 1
  Filled 2017-01-15: qty 1

## 2017-01-15 NOTE — ED Provider Notes (Signed)
[facility]-EMERGENCY DEPT Provider Note   CSN: [id number] Arrival date & time: 01/14/17  2049     History   Chief Complaint Chief Complaint  Patient presents with  . URI    HPI Sherri Reid is a 42 y.o. female.  Pt presents to the ED today with fever, cough, sore throat.  The pt said she's had sore throat for the past few days.  She has a productive cough and nasal drainage.  The pt denies any sick contacts.  She does smoke.      Past Medical History:  Diagnosis Date  . Asthma   . Infection    UTI  . Syphilis    treated  . Vaginal Pap smear, abnormal    f/u ok    There are no active problems to display for this patient.   Past Surgical History:  Procedure Laterality Date  . NO PAST SURGERIES      OB History    Gravida Para Term Preterm AB Living   1 0 0 0 1 0   SAB TAB Ectopic Multiple Live Births   1 0 0 0         Home Medications    Prior to Admission medications   Medication Sig Start Date End Date Taking? Authorizing Provider  albuterol (PROVENTIL HFA;VENTOLIN HFA) 108 (90 BASE) MCG/ACT inhaler Inhale 2 puffs into the lungs every 4 (four) hours as needed for wheezing or shortness of breath. 03/13/14   Horton, Mayer Maskerourtney F, MD  cyclobenzaprine (FLEXERIL) 10 MG tablet Take 1 tablet (10 mg total) by mouth 2 (two) times daily as needed for muscle spasms. 05/21/16   Janne NapoleonNeese, Hope M, NP  diclofenac (VOLTAREN) 50 MG EC tablet Take 1 tablet (50 mg total) by mouth 2 (two) times daily. 05/21/16   Janne NapoleonNeese, Hope M, NP  ibuprofen (ADVIL,MOTRIN) 600 MG tablet Take 1 tablet (600 mg total) by mouth every 6 (six) hours as needed. 01/15/17   Jacalyn LefevreHaviland, Serafina Topham, MD  metroNIDAZOLE (FLAGYL) 500 MG tablet Take 1 tablet (500 mg total) by mouth 2 (two) times daily. 07/31/15   Tery Sanfilippoiester, Matthew, MD  ondansetron (ZOFRAN ODT) 4 MG disintegrating tablet Take 1 tablet (4 mg total) by mouth every 8 (eight) hours as needed for nausea or vomiting. 07/31/15   Tery Sanfilippoiester, Matthew,  MD  predniSONE (STERAPRED UNI-PAK 21 TAB) 10 MG (21) TBPK tablet Take 6 tabs by mouth daily  for 2 days, then 5 tabs for 2 days, then 4 tabs for 2 days, then 3 tabs for 2 days, 2 tabs for 2 days, then 1 tab by mouth daily for 2 days 01/15/17   Jacalyn LefevreHaviland, Orval Dortch, MD    Family History Family History  Problem Relation Age of Onset  . Hyperlipidemia Mother   . Mental illness Mother   . Hypertension Mother   . Diabetes Father   . Cancer Maternal Aunt   . Cancer Paternal Uncle   . Anesthesia problems Neg Hx     Social History Social History   Tobacco Use  . Smoking status: Current Every Day Smoker    Packs/day: 1.00    Years: 22.00    Pack years: 22.00    Types: Cigarettes  . Smokeless tobacco: Never Used  Substance Use Topics  . Alcohol use: Yes    Comment: Once a month   . Drug use: No     Allergies   Penicillins   Review of Systems Review of Systems  Respiratory: Positive for cough  and shortness of breath.   All other systems reviewed and are negative.    Physical Exam Updated Vital Signs BP (!) 153/98 (BP Location: Left Arm)   Pulse 87   Temp 99.5 F (37.5 C) (Oral)   Resp 20   Ht 5\' 2"  (1.575 m)   Wt 113.4 kg (250 lb)   LMP 01/02/2017   SpO2 99%   BMI 45.73 kg/m   Physical Exam  Constitutional: She is oriented to person, place, and time. She appears well-developed and well-nourished.  HENT:  Head: Normocephalic and atraumatic.  Right Ear: External ear normal.  Left Ear: External ear normal.  Nose: Nose normal.  Mouth/Throat: Posterior oropharyngeal erythema present.  Eyes: Conjunctivae and EOM are normal. Pupils are equal, round, and reactive to light.  Neck: Normal range of motion. Neck supple.  Cardiovascular: Normal rate, regular rhythm, normal heart sounds and intact distal pulses.  Pulmonary/Chest: Effort normal. She has wheezes.  Abdominal: Soft. Bowel sounds are normal.  Musculoskeletal: Normal range of motion.  Neurological: She is alert and  oriented to person, place, and time.  Skin: Skin is warm and dry. Capillary refill takes less than 2 seconds.  Psychiatric: She has a normal mood and affect. Her behavior is normal. Judgment and thought content normal.  Nursing note and vitals reviewed.    ED Treatments / Results  Labs (all labs ordered are listed, but only abnormal results are displayed) Labs Reviewed  RAPID STREP SCREEN (NOT AT [facility])  CULTURE, GROUP A STREP [facility])    EKG  EKG Interpretation None       Radiology Dg Chest 2 View  Result Date: 01/15/2017 CLINICAL DATA:  Cough, fever and sore throat. EXAM: CHEST  2 VIEW COMPARISON:  None. FINDINGS: The heart size and mediastinal contours are within normal limits. Both lungs are clear. The visualized skeletal structures are unremarkable. IMPRESSION: No active cardiopulmonary disease. Electronically Signed   By: Tollie Eth M.D.   On: 01/15/2017 00:29    Procedures Procedures (including critical care time)  Medications Ordered in ED Medications  albuterol (PROVENTIL HFA;VENTOLIN HFA) 108 (90 Base) MCG/ACT inhaler 1-2 puff (2 puffs Inhalation Given 01/15/17 0018)  AEROCHAMBER PLUS FLO-VU MEDIUM MISC 1 each (not administered)  ketorolac (TORADOL) 30 MG/ML injection 30 mg (30 mg Intramuscular Given 01/15/17 0018)  ipratropium-albuterol (DUONEB) 0.5-2.5 (3) MG/3ML nebulizer solution 3 mL (3 mLs Nebulization Given 01/15/17 0013)  predniSONE (DELTASONE) tablet 60 mg (60 mg Oral Given 01/15/17 0018)     Initial Impression / Assessment and Plan / ED Course  I have reviewed the triage vital signs and the nursing notes.  Pertinent labs & imaging results that were available during my care of the patient were reviewed by me and considered in my medical decision making (see chart for details).    Pt is feeling much better. She is encouraged to stop smoking.  She is given an albuterol inhaler and spacer prior to d/c.  She knows to return if worse.  Final Clinical  Impressions(s) / ED Diagnoses   Final diagnoses:  Acute upper respiratory infection  Mild intermittent reactive airway disease with acute exacerbation  Tobacco abuse    ED Discharge Orders        Ordered    ibuprofen (ADVIL,MOTRIN) 600 MG tablet  Every 6 hours PRN     01/15/17 0037    predniSONE (STERAPRED UNI-PAK 21 TAB) 10 MG (21) TBPK tablet     01/15/17 0037  Jacalyn Lefevre, MD 01/15/17 [phone]

## 2017-01-17 LAB — CULTURE, GROUP A STREP (THRC)

## 2017-02-22 ENCOUNTER — Other Ambulatory Visit: Payer: Self-pay

## 2017-02-22 ENCOUNTER — Encounter (HOSPITAL_COMMUNITY): Payer: Self-pay

## 2017-02-22 ENCOUNTER — Emergency Department (HOSPITAL_COMMUNITY)
Admission: EM | Admit: 2017-02-22 | Discharge: 2017-02-22 | Disposition: A | Discharge status: Home / Self Care | Payer: PRIVATE HEALTH INSURANCE | Attending: Emergency Medicine | Admitting: Emergency Medicine

## 2017-02-22 DIAGNOSIS — M62830 Muscle spasm of back: Secondary | ICD-10-CM

## 2017-02-22 DIAGNOSIS — M25511 Pain in right shoulder: Secondary | ICD-10-CM | POA: Diagnosis not present

## 2017-02-22 DIAGNOSIS — Y9389 Activity, other specified: Secondary | ICD-10-CM | POA: Diagnosis not present

## 2017-02-22 DIAGNOSIS — S3992XA Unspecified injury of lower back, initial encounter: Secondary | ICD-10-CM | POA: Diagnosis present

## 2017-02-22 DIAGNOSIS — J45909 Unspecified asthma, uncomplicated: Secondary | ICD-10-CM | POA: Insufficient documentation

## 2017-02-22 DIAGNOSIS — F17210 Nicotine dependence, cigarettes, uncomplicated: Secondary | ICD-10-CM | POA: Insufficient documentation

## 2017-02-22 DIAGNOSIS — S39012A Strain of muscle, fascia and tendon of lower back, initial encounter: Secondary | ICD-10-CM | POA: Diagnosis not present

## 2017-02-22 DIAGNOSIS — Y990 Civilian activity done for income or pay: Secondary | ICD-10-CM | POA: Insufficient documentation

## 2017-02-22 DIAGNOSIS — X503XXA Overexertion from repetitive movements, initial encounter: Secondary | ICD-10-CM | POA: Diagnosis not present

## 2017-02-22 DIAGNOSIS — Y9229 Other specified public building as the place of occurrence of the external cause: Secondary | ICD-10-CM | POA: Diagnosis not present

## 2017-02-22 MED ORDER — KETOROLAC TROMETHAMINE 60 MG/2ML IM SOLN
30.0000 mg | Freq: Once | INTRAMUSCULAR | Status: AC
  Administered 2017-02-22: 30 mg via INTRAMUSCULAR
  Filled 2017-02-22: qty 2

## 2017-02-22 MED ORDER — DICLOFENAC SODIUM 50 MG PO TBEC: 50.0000 mg | DELAYED_RELEASE_TABLET | Freq: Two times a day (BID) | ORAL | 0 refills | Status: DC

## 2017-02-22 MED ORDER — CYCLOBENZAPRINE HCL 10 MG PO TABS
10.0000 mg | ORAL_TABLET | Freq: Once | ORAL | Status: AC
  Administered 2017-02-22: 10 mg via ORAL
  Filled 2017-02-22: qty 1

## 2017-02-22 MED ORDER — CYCLOBENZAPRINE HCL 10 MG PO TABS: 10.0000 mg | ORAL_TABLET | Freq: Two times a day (BID) | ORAL | 0 refills | Status: DC | PRN

## 2017-02-22 MED ORDER — PREDNISONE 20 MG PO TABS
60.0000 mg | ORAL_TABLET | Freq: Once | ORAL | Status: AC
Start: 2017-02-22 — End: 2017-02-22
  Administered 2017-02-22: 60 mg via ORAL
  Filled 2017-02-22: qty 3

## 2017-02-22 MED ORDER — PREDNISONE 10 MG PO TABS: 20.0000 mg | ORAL_TABLET | Freq: Two times a day (BID) | ORAL | 0 refills | Status: DC

## 2017-02-22 NOTE — ED Triage Notes (Signed)
Pt states lower back pain for 1 week Denies injury. Pt also reports pain radiates into her left hip. Pt also reports numbness and tingling. Denies any bowel or bladder changes. Also reports right shoulder pain.

## 2017-02-22 NOTE — ED Notes (Signed)
Pt ready to go. States that she feels fine. Ambulating without difficulty.

## 2017-02-22 NOTE — ED Provider Notes (Signed)
[facility]   CSN: [id number] Arrival date & time: 02/22/17  1846     History   Chief Complaint Chief Complaint  Patient presents with  . Back Pain    HPI Sherri Reid is a 42 y.o. female who presents to the ED with back pain. The pain started one week ago. The pain radiates to the left hip. Patient reports numbness and tingling. She denies loss of control of bladder or bowels. 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 She c/o pain to the anterior aspect of the right shoulder.   HPI  Past Medical History:  Diagnosis Date  . Asthma   . Infection    UTI  . Syphilis    treated  . Vaginal Pap smear, abnormal    f/u ok    There are no active problems to display for this patient.   Past Surgical History:  Procedure Laterality Date  . NO PAST SURGERIES      OB History    Gravida Para Term Preterm AB Living   1 0 0 0 1 0   SAB TAB Ectopic Multiple Live Births   1 0 0 0         Home Medications    Prior to Admission medications   Medication Sig Start Date End Date Taking? Authorizing Provider  albuterol (PROVENTIL HFA;VENTOLIN HFA) 108 (90 BASE) MCG/ACT inhaler Inhale 2 puffs into the lungs every 4 (four) hours as needed for wheezing or shortness of breath. 03/13/14   Horton, Mayer Maskerourtney F, MD  cyclobenzaprine (FLEXERIL) 10 MG tablet Take 1 tablet (10 mg total) by mouth 2 (two) times daily as needed for muscle spasms. 02/22/17   Janne NapoleonNeese, Jeimy Bickert M, NP  diclofenac (VOLTAREN) 50 MG EC tablet Take 1 tablet (50 mg total) by mouth 2 (two) times daily. 02/22/17   Janne NapoleonNeese, Maricella Filyaw M, NP  ibuprofen (ADVIL,MOTRIN) 600 MG tablet Take 1 tablet (600 mg total) by mouth every 6 (six) hours as needed. 01/15/17   Jacalyn LefevreHaviland, Julie, MD  metroNIDAZOLE (FLAGYL) 500 MG tablet Take 1 tablet (500 mg total) by mouth 2 (two) times daily. 07/31/15   Tery Sanfilippoiester, Matthew, MD  ondansetron (ZOFRAN ODT) 4 MG  disintegrating tablet Take 1 tablet (4 mg total) by mouth every 8 (eight) hours as needed for nausea or vomiting. 07/31/15   Tery Sanfilippoiester, Matthew, MD  predniSONE (DELTASONE) 10 MG tablet Take 2 tablets (20 mg total) by mouth 2 (two) times daily with a meal. 02/22/17   Janne NapoleonNeese, Dangelo Guzzetta M, NP    Family History Family History  Problem Relation Age of Onset  . Hyperlipidemia Mother   . Mental illness Mother   . Hypertension Mother   . Diabetes Father   . Cancer Maternal Aunt   . Cancer Paternal Uncle   . Anesthesia problems Neg Hx     Social History Social History   Tobacco Use  . Smoking status: Current Every Day Smoker    Packs/day: 1.00    Years: 22.00    Pack years: 22.00    Types: Cigarettes  . Smokeless tobacco: Never Used  Substance Use Topics  . Alcohol use: Yes    Comment: Once a month   . Drug use: No     Allergies   Penicillins   Review of Systems Review of Systems  Musculoskeletal: Positive for arthralgias and back pain.  All other systems reviewed and are  negative.    Physical Exam Updated Vital Signs BP (!) 144/65 (BP Location: Right Arm)   Pulse 71   Temp 98.1 F (36.7 C) (Oral)   Resp 18   LMP 02/12/2017 (Exact Date)   SpO2 100%   Physical Exam  Constitutional: She appears well-developed and well-nourished. No distress.  HENT:  Head: Normocephalic and atraumatic.  Nose: Nose normal.  Eyes: EOM are normal.  Neck: Normal range of motion. Neck supple.  Cardiovascular: Normal rate and regular rhythm.  Pulmonary/Chest: Effort normal. She has no wheezes. She has no rales.  Abdominal: Soft. There is no tenderness.  Musculoskeletal:       Right shoulder: She exhibits tenderness and spasm. She exhibits normal range of motion, no deformity, normal pulse and normal strength.       Lumbar back: She exhibits tenderness, pain and spasm. She exhibits normal pulse.  Neurological: She is alert. She has normal strength. Gait normal.  Reflex Scores:      Bicep  reflexes are 2+ on the right side and 2+ on the left side.      Brachioradialis reflexes are 2+ on the right side and 2+ on the left side.      Patellar reflexes are 2+ on the right side and 2+ on the left side. Grips are equal. Radial pulses 2+, adequate circulation.  Skin: Skin is warm and dry.  Psychiatric: She has a normal mood and affect. Her behavior is normal.  Nursing note and vitals reviewed.    ED Treatments / Results  Labs (all labs ordered are listed, but only abnormal results are displayed) Labs Reviewed - No data to display   Radiology No results found.  Procedures Procedures (including critical care time)  Medications Ordered in ED Medications  cyclobenzaprine (FLEXERIL) tablet 10 mg (10 mg Oral Given 02/22/17 2118)  predniSONE (DELTASONE) tablet 60 mg (60 mg Oral Given 02/22/17 2118)  ketorolac (TORADOL) injection 30 mg (30 mg Intramuscular Given 02/22/17 2118)     Initial Impression / Assessment and Plan / ED Course  I have reviewed the triage vital signs and the nursing notes. Patient with back pain.  No neurological deficits and normal neuro exam.  Patient can walk but states is painful.  No loss of bowel or bladder control.  No concern for cauda equina.  No fever, night sweats, weight loss, h/o cancer, IVDU.  RICE protocol and pain medicine indicated and discussed with patient.  Patient also with right shoulder pain. Will treat for pain and inflammation and spasm. Discussed with the patient need for follow up. She agrees with plan.   Final Clinical Impressions(s) / ED Diagnoses   Final diagnoses:  Strain of lumbar region, initial encounter  Spasm of muscle of lower back  Acute pain of right shoulder    ED Discharge Orders        Ordered    cyclobenzaprine (FLEXERIL) 10 MG tablet  2 times daily PRN,   Status:  Discontinued     02/22/17 2102    diclofenac (VOLTAREN) 50 MG EC tablet  2 times daily,   Status:  Discontinued     02/22/17 2102    predniSONE  (DELTASONE) 10 MG tablet  2 times daily with meals     02/22/17 2102    cyclobenzaprine (FLEXERIL) 10 MG tablet  2 times daily PRN     02/22/17 2106    diclofenac (VOLTAREN) 50 MG EC tablet  2 times daily     02/22/17 2106  Kerrie Buffalo Ben Arnold, [place] 02/22/17 2132    Benjiman Core, MD 02/22/17 2237

## 2017-04-19 DIAGNOSIS — D649 Anemia, unspecified: Secondary | ICD-10-CM | POA: Insufficient documentation

## 2017-04-19 DIAGNOSIS — N939 Abnormal uterine and vaginal bleeding, unspecified: Secondary | ICD-10-CM | POA: Insufficient documentation

## 2017-04-19 DIAGNOSIS — R1030 Lower abdominal pain, unspecified: Secondary | ICD-10-CM | POA: Diagnosis not present

## 2017-04-19 DIAGNOSIS — F17210 Nicotine dependence, cigarettes, uncomplicated: Secondary | ICD-10-CM | POA: Insufficient documentation

## 2017-04-19 DIAGNOSIS — J45909 Unspecified asthma, uncomplicated: Secondary | ICD-10-CM | POA: Insufficient documentation

## 2017-04-20 ENCOUNTER — Emergency Department (HOSPITAL_COMMUNITY): Payer: PRIVATE HEALTH INSURANCE

## 2017-04-20 ENCOUNTER — Encounter (HOSPITAL_COMMUNITY): Payer: Self-pay | Admitting: Emergency Medicine

## 2017-04-20 ENCOUNTER — Emergency Department (HOSPITAL_COMMUNITY)
Admission: EM | Admit: 2017-04-20 | Discharge: 2017-04-20 | Disposition: A | Discharge status: Home / Self Care | Payer: PRIVATE HEALTH INSURANCE | Attending: Emergency Medicine | Admitting: Emergency Medicine

## 2017-04-20 ENCOUNTER — Other Ambulatory Visit: Payer: Self-pay

## 2017-04-20 DIAGNOSIS — D649 Anemia, unspecified: Secondary | ICD-10-CM

## 2017-04-20 DIAGNOSIS — N939 Abnormal uterine and vaginal bleeding, unspecified: Secondary | ICD-10-CM

## 2017-04-20 LAB — GC/CHLAMYDIA PROBE AMP (~~LOC~~) NOT AT ARMC
Chlamydia: NEGATIVE
NEISSERIA GONORRHEA: NEGATIVE

## 2017-04-20 LAB — URINALYSIS, ROUTINE W REFLEX MICROSCOPIC
Bilirubin Urine: NEGATIVE
GLUCOSE, UA: NEGATIVE mg/dL
KETONES UR: NEGATIVE mg/dL
Leukocytes, UA: NEGATIVE
NITRITE: NEGATIVE
PROTEIN: NEGATIVE mg/dL
Specific Gravity, Urine: 1.026 (ref 1.005–1.030)
pH: 6 (ref 5.0–8.0)

## 2017-04-20 LAB — WET PREP, GENITAL
Sperm: NONE SEEN
Trich, Wet Prep: NONE SEEN
YEAST WET PREP: NONE SEEN

## 2017-04-20 LAB — I-STAT CHEM 8, ED
BUN: 8 mg/dL (ref 6–20)
CALCIUM ION: 1.13 mmol/L — AB (ref 1.15–1.40)
CREATININE: 1 mg/dL (ref 0.44–1.00)
Chloride: 105 mmol/L (ref 101–111)
GLUCOSE: 92 mg/dL (ref 65–99)
HCT: 35 % — ABNORMAL LOW (ref 36.0–46.0)
HEMOGLOBIN: 11.9 g/dL — AB (ref 12.0–15.0)
Potassium: 3.4 mmol/L — ABNORMAL LOW (ref 3.5–5.1)
Sodium: 141 mmol/L (ref 135–145)
TCO2: 26 mmol/L (ref 22–32)

## 2017-04-20 LAB — I-STAT BETA HCG BLOOD, ED (MC, WL, AP ONLY): I-stat hCG, quantitative: <5 m[IU]/mL (ref <5)

## 2017-04-20 MED ORDER — FERROUS SULFATE 325 (65 FE) MG PO TABS: 325.0000 mg | ORAL_TABLET | Freq: Every day | ORAL | 0 refills | Status: DC

## 2017-04-20 MED ORDER — IBUPROFEN 800 MG PO TABS
800.0000 mg | ORAL_TABLET | Freq: Once | ORAL | Status: AC
  Administered 2017-04-20: 800 mg via ORAL
  Filled 2017-04-20: qty 1

## 2017-04-20 NOTE — ED Triage Notes (Signed)
Pt reports having vaginal bleeding that has been ongoing since April 08, 2017 and has varied in flow.

## 2017-04-20 NOTE — ED Provider Notes (Signed)
[facility]-EMERGENCY DEPT Provider Note   CSN: [id number] Arrival date & time: 04/19/17  2258     History   Chief Complaint Chief Complaint  Patient presents with  . Vaginal Bleeding    HPI Sherri Reid is a 42 y.o. female with a hx of asthma, recurrent UTI, syphilis presents to the Emergency Department complaining of gradual, persistent, waxing and waning vaginal bleeding onset April 08, 2017.  Patient reports that the bleeding began as her usual menses but has been persistent.  She reports that some days she changes a normal maxi pad 3-4 times and some days she only requires one change.  Patient reports lower abdominal cramping in association with right greater than left.  She denies a history of pelvic inflammatory disease or ovarian cysts.  She reports questionable family history of ovarian cancer in a cousin.  Patient reports she is sexually active with one female partner.  She states no known history of STDs however records shows she has a history of syphilis.  She denies any vaginal discharge, dysuria, fevers, chills, nausea, vomiting, diarrhea, constipation.  Patient denies dyspnea on exertion, chest pain, shortness of breath, generalized weakness.  She denies a history of anemia or bleeding disorder.   The history is provided by the patient and medical records. No language interpreter was used.    Past Medical History:  Diagnosis Date  . Asthma   . Infection    UTI  . Syphilis    treated  . Vaginal Pap smear, abnormal    f/u ok    There are no active problems to display for this patient.   Past Surgical History:  Procedure Laterality Date  . NO PAST SURGERIES       OB History    Gravida  1   Para  0   Term  0   Preterm  0   AB  1   Living  0     SAB  1   TAB  0   Ectopic  0   Multiple  0   Live Births               Home Medications    Prior to Admission medications   Medication Sig Start Date End Date Taking?  Authorizing Provider  albuterol (PROVENTIL HFA;VENTOLIN HFA) 108 (90 BASE) MCG/ACT inhaler Inhale 2 puffs into the lungs every 4 (four) hours as needed for wheezing or shortness of breath. 03/13/14  Yes Horton, Mayer Masker, MD  cyclobenzaprine (FLEXERIL) 10 MG tablet Take 1 tablet (10 mg total) by mouth 2 (two) times daily as needed for muscle spasms. Patient not taking: Reported on 04/20/2017 02/22/17   Janne Napoleon, NP  diclofenac (VOLTAREN) 50 MG EC tablet Take 1 tablet (50 mg total) by mouth 2 (two) times daily. Patient not taking: Reported on 04/20/2017 02/22/17   Janne Napoleon, NP  ferrous sulfate 325 (65 FE) MG tablet Take 1 tablet (325 mg total) by mouth daily. 04/20/17   Dara Camargo, Dahlia Client, PA-C  ibuprofen (ADVIL,MOTRIN) 600 MG tablet Take 1 tablet (600 mg total) by mouth every 6 (six) hours as needed. Patient not taking: Reported on 04/20/2017 01/15/17   Jacalyn Lefevre, MD  predniSONE (DELTASONE) 10 MG tablet Take 2 tablets (20 mg total) by mouth 2 (two) times daily with a meal. Patient not taking: Reported on 04/20/2017 02/22/17   Janne Napoleon, NP    Family History Family History  Problem Relation Age of Onset  .  Hyperlipidemia Mother   . Mental illness Mother   . Hypertension Mother   . Diabetes Father   . Cancer Maternal Aunt   . Cancer Paternal Uncle   . Anesthesia problems Neg Hx     Social History Social History   Tobacco Use  . Smoking status: Current Every Day Smoker    Packs/day: 1.00    Years: 22.00    Pack years: 22.00    Types: Cigarettes  . Smokeless tobacco: Never Used  Substance Use Topics  . Alcohol use: Yes    Comment: Once a month   . Drug use: No     Allergies   Penicillins   Review of Systems Review of Systems  Constitutional: Negative for appetite change, diaphoresis, fatigue, fever and unexpected weight change.  HENT: Negative for mouth sores.   Eyes: Negative for visual disturbance.  Respiratory: Negative for cough, chest tightness,  shortness of breath and wheezing.   Cardiovascular: Negative for chest pain.  Gastrointestinal: Positive for abdominal pain. Negative for constipation, diarrhea, nausea and vomiting.  Endocrine: Negative for polydipsia, polyphagia and polyuria.  Genitourinary: Positive for vaginal bleeding. Negative for dysuria, frequency, hematuria and urgency.  Musculoskeletal: Negative for back pain and neck stiffness.  Skin: Negative for rash.  Allergic/Immunologic: Negative for immunocompromised state.  Neurological: Negative for syncope, light-headedness and headaches.  Hematological: Does not bruise/bleed easily.  Psychiatric/Behavioral: Negative for sleep disturbance. The patient is not nervous/anxious.      Physical Exam Updated Vital Signs BP (!) 174/97 (BP Location: Left Arm)   Pulse 77   Temp 98.1 F (36.7 C) (Oral)   Resp 20   Ht 5\' 2"  (1.575 m)   Wt 121.6 kg (268 lb)   SpO2 100%   BMI 49.02 kg/m   Physical Exam  Constitutional: She appears well-developed and well-nourished. No distress.  Awake, alert, nontoxic appearance  HENT:  Head: Normocephalic and atraumatic.  Mouth/Throat: Oropharynx is clear and moist. No oropharyngeal exudate.  Eyes: Conjunctivae are normal. No scleral icterus.  Neck: Normal range of motion. Neck supple.  Cardiovascular: Normal rate, regular rhythm, normal heart sounds and intact distal pulses.  No murmur heard. Pulmonary/Chest: Effort normal and breath sounds normal. No respiratory distress. She has no wheezes.  Equal chest expansion  Abdominal: Soft. Bowel sounds are normal. She exhibits no mass. There is no tenderness. There is no rebound and no guarding. Hernia confirmed negative in the right inguinal area and confirmed negative in the left inguinal area.  Genitourinary: Uterus normal. No labial fusion. There is no rash, tenderness or lesion on the right labia. There is no rash, tenderness or lesion on the left labia. Uterus is not deviated, not  enlarged, not fixed and not tender. Cervix exhibits no motion tenderness, no discharge and no friability. Right adnexum displays tenderness. Right adnexum displays no mass and no fullness. Left adnexum displays tenderness. Left adnexum displays no mass and no fullness. There is bleeding in the vagina. No erythema or tenderness in the vagina. No foreign body in the vagina. No signs of injury around the vagina. No vaginal discharge found.  Genitourinary Comments: Small amount of bright red blood in the vaginal vault. Bilateral adnexal tenderness without mass.  No cervical motion tenderness. Chaperone present, Tia (EMT)  Musculoskeletal: Normal range of motion. She exhibits no edema.  Lymphadenopathy:       Right: No inguinal adenopathy present.       Left: No inguinal adenopathy present.  Neurological: She is alert.  Speech  is clear and goal oriented Moves extremities without ataxia  Skin: Skin is warm and dry. She is not diaphoretic. No erythema.  Psychiatric: She has a normal mood and affect.  Nursing note and vitals reviewed.    ED Treatments / Results  Labs (all labs ordered are listed, but only abnormal results are displayed) Labs Reviewed  WET PREP, GENITAL - Abnormal; Notable for the following components:      Result Value   Clue Cells Wet Prep HPF POC PRESENT (*)    WBC, Wet Prep HPF POC MODERATE (*)    All other components within normal limits  URINALYSIS, ROUTINE W REFLEX MICROSCOPIC - Abnormal; Notable for the following components:   APPearance HAZY (*)    Hgb urine dipstick LARGE (*)    Bacteria, UA RARE (*)    Squamous Epithelial / LPF 6-30 (*)    All other components within normal limits  I-STAT CHEM 8, ED - Abnormal; Notable for the following components:   Potassium 3.4 (*)    Calcium, Ion 1.13 (*)    Hemoglobin 11.9 (*)    HCT 35.0 (*)    All other components within normal limits  I-STAT BETA HCG BLOOD, ED ([facility], [facility], [facility] ONLY)  GC/CHLAMYDIA PROBE AMP ([place])  NOT [facility]    EKG None  Radiology [place] Transvaginal Non-ob  Result Date: 04/20/2017 CLINICAL DATA:  Acute onset of vaginal bleeding and adnexal pain. EXAM: TRANSABDOMINAL AND TRANSVAGINAL ULTRASOUND OF PELVIS DOPPLER ULTRASOUND OF OVARIES TECHNIQUE: Both transabdominal and transvaginal ultrasound examinations of the pelvis were performed. Transabdominal technique was performed for global imaging of the pelvis including uterus, ovaries, adnexal regions, and pelvic cul-de-sac. It was necessary to proceed with endovaginal exam following the transabdominal exam to visualize the uterus and ovaries in greater detail. Color and duplex Doppler ultrasound was utilized to evaluate blood flow to the ovaries. COMPARISON:  CT of the abdomen and pelvis performed 07/31/2015, and pelvic ultrasound performed 03/09/2014 FINDINGS: Uterus Measurements: 6.0 x 3.2 x 4.3 cm. No fibroids or other mass visualized. Endometrium Thickness: 0.4 cm.  No focal abnormality visualized. Right ovary Measurements: 3.5 x 2.3 x 2.7 cm. Normal appearance/no adnexal mass. Left ovary Measurements: 3.0 x 2.2 x 1.6 cm. Normal appearance/no adnexal mass. Pulsed Doppler evaluation of both ovaries demonstrates normal low-resistance arterial and venous waveforms. Other findings No abnormal free fluid. IMPRESSION: Unremarkable pelvic ultrasound.  No evidence for ovarian torsion. Electronically Signed   By: Roanna RaiderJeffery  Chang M.D.   On: 04/20/2017 03:29   [place] Pelvis Complete  Result Date: 04/20/2017 CLINICAL DATA:  Acute onset of vaginal bleeding and adnexal pain. EXAM: TRANSABDOMINAL AND TRANSVAGINAL ULTRASOUND OF PELVIS DOPPLER ULTRASOUND OF OVARIES TECHNIQUE: Both transabdominal and transvaginal ultrasound examinations of the pelvis were performed. Transabdominal technique was performed for global imaging of the pelvis including uterus, ovaries, adnexal regions, and pelvic cul-de-sac. It was necessary to proceed with endovaginal exam following the  transabdominal exam to visualize the uterus and ovaries in greater detail. Color and duplex Doppler ultrasound was utilized to evaluate blood flow to the ovaries. COMPARISON:  CT of the abdomen and pelvis performed 07/31/2015, and pelvic ultrasound performed 03/09/2014 FINDINGS: Uterus Measurements: 6.0 x 3.2 x 4.3 cm. No fibroids or other mass visualized. Endometrium Thickness: 0.4 cm.  No focal abnormality visualized. Right ovary Measurements: 3.5 x 2.3 x 2.7 cm. Normal appearance/no adnexal mass. Left ovary Measurements: 3.0 x 2.2 x 1.6 cm. Normal appearance/no adnexal mass. Pulsed Doppler evaluation of both ovaries demonstrates  normal low-resistance arterial and venous waveforms. Other findings No abnormal free fluid. IMPRESSION: Unremarkable pelvic ultrasound.  No evidence for ovarian torsion. Electronically Signed   By: Roanna Raider M.D.   On: 04/20/2017 03:29   [place] Art/ven Flow Abd Pelv Doppler  Result Date: 04/20/2017 CLINICAL DATA:  Acute onset of vaginal bleeding and adnexal pain. EXAM: TRANSABDOMINAL AND TRANSVAGINAL ULTRASOUND OF PELVIS DOPPLER ULTRASOUND OF OVARIES TECHNIQUE: Both transabdominal and transvaginal ultrasound examinations of the pelvis were performed. Transabdominal technique was performed for global imaging of the pelvis including uterus, ovaries, adnexal regions, and pelvic cul-de-sac. It was necessary to proceed with endovaginal exam following the transabdominal exam to visualize the uterus and ovaries in greater detail. Color and duplex Doppler ultrasound was utilized to evaluate blood flow to the ovaries. COMPARISON:  CT of the abdomen and pelvis performed 07/31/2015, and pelvic ultrasound performed 03/09/2014 FINDINGS: Uterus Measurements: 6.0 x 3.2 x 4.3 cm. No fibroids or other mass visualized. Endometrium Thickness: 0.4 cm.  No focal abnormality visualized. Right ovary Measurements: 3.5 x 2.3 x 2.7 cm. Normal appearance/no adnexal mass. Left ovary Measurements: 3.0 x 2.2 x  1.6 cm. Normal appearance/no adnexal mass. Pulsed Doppler evaluation of both ovaries demonstrates normal low-resistance arterial and venous waveforms. Other findings No abnormal free fluid. IMPRESSION: Unremarkable pelvic ultrasound.  No evidence for ovarian torsion. Electronically Signed   By: Roanna Raider M.D.   On: 04/20/2017 03:29    Procedures Procedures (including critical care time)  Medications Ordered in ED Medications - No data to display   Initial Impression / Assessment and Plan / ED Course  I have reviewed the triage vital signs and the nursing notes.  Pertinent labs & imaging results that were available during my care of the patient were reviewed by me and considered in my medical decision making (see chart for details).  Clinical Course as of Apr 20 404  Mon Apr 20, 2017  0403 Clue cells present, likely secondary to persistent vaginal bleeding.  Clue Cells Wet Prep HPF POC(!): PRESENT [HM]  0403 Noted.  Patient is not symptomatic.  Hemoglobin(!): 11.9 [HM]  0403 No evidence of urinary tract infection.  Leukocytes, UA: NEGATIVE [HM]  0404 Patient is afebrile.  Temp: 98.3 F (36.8 C) [HM]  0404 Patient noted to be hypertensive in the emergency department.  No signs of hypertensive urgency.  Discussed with patient the need for close follow-up and management by their primary care physician.    BP(!): 174/97 [HM]    Clinical Course User Index [HM] Ellis Koffler, Dahlia Client, PA-C    Patient presents with dysfunctional uterine bleeding for the last several weeks.  She has mild anemia hemoglobin of 11.9 but no symptomatic anemia.  No large volume of blood in the vaginal vault.  Abdomen is soft without rebound or guarding.  No evidence of urinary tract infection.  Ultrasound without signs of tubo-ovarian abscess, ovarian cyst or other pelvic abnormality.  Patient reports she is low risk for STD.  She will need close primary care and OB/GYN follow-up.  Will initiate iron  supplements.  Patient and significant other state understanding and are in agreement with the plan.  BP (!) 158/89 (BP Location: Right Arm)   Pulse 71   Temp 98.1 F (36.7 C) (Oral)   Resp 17   Ht 5\' 2"  (1.575 m)   Wt 121.6 kg (268 lb)   SpO2 94%   BMI 49.02 kg/m    Final Clinical Impressions(s) / ED Diagnoses   Final diagnoses:  Vaginal bleeding  Anemia, unspecified type    ED Discharge Orders        Ordered    ferrous sulfate 325 (65 FE) MG tablet  Daily     04/20/17 0406       Nelli Swalley, Boyd Kerbs 04/20/17 0647    Palumbo, April, MD 04/20/17 9811

## 2017-04-20 NOTE — Discharge Instructions (Signed)
1. Medications: usual home medications 2. Treatment: rest, drink plenty of fluids, tylenol or ibuprofen for cramping 3. Follow Up: Please followup with OB/GYN for 3-5 days for discussion of your diagnoses and further evaluation after today's visit; if you do not have a primary care doctor use the resource guide provided to find one; Please return to the ER for worsening bleeding, worsening pain, fevers or other concerns

## 2017-06-25 ENCOUNTER — Emergency Department (HOSPITAL_COMMUNITY)
Admission: EM | Admit: 2017-06-25 | Discharge: 2017-06-26 | Disposition: A | Discharge status: Home / Self Care | Payer: PRIVATE HEALTH INSURANCE | Attending: Emergency Medicine | Admitting: Emergency Medicine

## 2017-06-25 ENCOUNTER — Other Ambulatory Visit: Payer: Self-pay

## 2017-06-25 ENCOUNTER — Encounter (HOSPITAL_COMMUNITY): Payer: Self-pay

## 2017-06-25 DIAGNOSIS — F17210 Nicotine dependence, cigarettes, uncomplicated: Secondary | ICD-10-CM | POA: Insufficient documentation

## 2017-06-25 DIAGNOSIS — Z79899 Other long term (current) drug therapy: Secondary | ICD-10-CM | POA: Insufficient documentation

## 2017-06-25 DIAGNOSIS — R070 Pain in throat: Secondary | ICD-10-CM | POA: Diagnosis not present

## 2017-06-25 DIAGNOSIS — R1031 Right lower quadrant pain: Secondary | ICD-10-CM | POA: Diagnosis present

## 2017-06-25 DIAGNOSIS — J45909 Unspecified asthma, uncomplicated: Secondary | ICD-10-CM | POA: Diagnosis not present

## 2017-06-25 DIAGNOSIS — J029 Acute pharyngitis, unspecified: Secondary | ICD-10-CM

## 2017-06-25 LAB — URINALYSIS, ROUTINE W REFLEX MICROSCOPIC
Bilirubin Urine: NEGATIVE
GLUCOSE, UA: NEGATIVE mg/dL
HGB URINE DIPSTICK: NEGATIVE
Ketones, ur: NEGATIVE mg/dL
LEUKOCYTES UA: NEGATIVE
Nitrite: NEGATIVE
PROTEIN: NEGATIVE mg/dL
SPECIFIC GRAVITY, URINE: 1.024 (ref 1.005–1.030)
pH: 6 (ref 5.0–8.0)

## 2017-06-25 LAB — CBC
HCT: 38.2 % (ref 36.0–46.0)
HEMOGLOBIN: 11.9 g/dL — AB (ref 12.0–15.0)
MCH: 26.2 pg (ref 26.0–34.0)
MCHC: 31.2 g/dL (ref 30.0–36.0)
MCV: 84.1 fL (ref 78.0–100.0)
PLATELETS: 331 10*3/uL (ref 150–400)
RBC: 4.54 MIL/uL (ref 3.87–5.11)
RDW: 13.4 % (ref 11.5–15.5)
WBC: 7.5 10*3/uL (ref 4.0–10.5)

## 2017-06-25 LAB — GROUP A STREP BY PCR: GROUP A STREP BY PCR: NOT DETECTED

## 2017-06-25 LAB — I-STAT BETA HCG BLOOD, ED (MC, WL, AP ONLY)

## 2017-06-25 NOTE — ED Triage Notes (Signed)
Pt reports that for the past week she has been having RLQ abd pain, vomited x 1 today, denies fevers, also sore throat for one week, denies dysuria

## 2017-06-26 ENCOUNTER — Emergency Department (HOSPITAL_COMMUNITY): Payer: PRIVATE HEALTH INSURANCE

## 2017-06-26 LAB — COMPREHENSIVE METABOLIC PANEL
ALT: 13 U/L — ABNORMAL LOW (ref 14–54)
ANION GAP: 6 (ref 5–15)
AST: 19 U/L (ref 15–41)
Albumin: 3.6 g/dL (ref 3.5–5.0)
Alkaline Phosphatase: 61 U/L (ref 38–126)
BILIRUBIN TOTAL: 0.4 mg/dL (ref 0.3–1.2)
BUN: 8 mg/dL (ref 6–20)
CALCIUM: 8.8 mg/dL — AB (ref 8.9–10.3)
CO2: 26 mmol/L (ref 22–32)
Chloride: 107 mmol/L (ref 101–111)
Creatinine, Ser: 0.95 mg/dL (ref 0.44–1.00)
GFR calc non Af Amer: >60 mL/min (ref >60)
Glucose, Bld: 78 mg/dL (ref 65–99)
POTASSIUM: 3.8 mmol/L (ref 3.5–5.1)
SODIUM: 139 mmol/L (ref 135–145)
TOTAL PROTEIN: 7 g/dL (ref 6.5–8.1)

## 2017-06-26 LAB — LIPASE, BLOOD: Lipase: 29 U/L (ref 11–51)

## 2017-06-26 MED ORDER — SODIUM CHLORIDE 0.9 % IV SOLN: INTRAVENOUS | Status: DC

## 2017-06-26 MED ORDER — ONDANSETRON HCL 4 MG/2ML IJ SOLN
4.0000 mg | Freq: Once | INTRAMUSCULAR | Status: AC
  Administered 2017-06-26: 4 mg via INTRAVENOUS
  Filled 2017-06-26: qty 2

## 2017-06-26 MED ORDER — SODIUM CHLORIDE 0.9 % IV BOLUS
1000.0000 mL | Freq: Once | INTRAVENOUS | Status: AC
  Administered 2017-06-26: 1000 mL via INTRAVENOUS

## 2017-06-26 MED ORDER — IOHEXOL 300 MG/ML  SOLN
100.0000 mL | Freq: Once | INTRAMUSCULAR | Status: AC | PRN
  Administered 2017-06-26: 100 mL via INTRAVENOUS

## 2017-06-26 MED ORDER — FAMOTIDINE 20 MG PO TABS: 20.0000 mg | ORAL_TABLET | Freq: Two times a day (BID) | ORAL | 0 refills | Status: DC

## 2017-06-26 MED ORDER — MORPHINE SULFATE (PF) 4 MG/ML IV SOLN
4.0000 mg | Freq: Once | INTRAVENOUS | Status: AC
  Administered 2017-06-26: 4 mg via INTRAVENOUS
  Filled 2017-06-26: qty 1

## 2017-06-26 NOTE — ED Provider Notes (Signed)
[facility] Provider [facility]   CSN: [id number] Arrival date & time: 06/25/17  2229     History   Chief Complaint Chief Complaint  Patient presents with  . Abdominal Pain  . Sore Throat    HPI Maryann AlarSamantha Shavers is a 42 y.o. female.  The history is provided by the patient.  Abdominal Pain   This is a new problem. Episode onset: 1 week ago. The problem occurs constantly. The problem has been gradually worsening. The pain is associated with an unknown factor. The pain is located in the RLQ. The quality of the pain is colicky and throbbing. The pain is at a severity of 6/10. The pain is moderate. Associated symptoms include anorexia, diarrhea, nausea and vomiting. Pertinent negatives include fever, constipation, dysuria and frequency. Associated symptoms comments: Diarrhea the last 2 days.  Sore throat worse in the morning and ongoing for the last 1 week.  Feels like burning.  No cough, congestion or SOB.  No vaginal discharge or urinary sx.. The symptoms are aggravated by vomiting. Nothing relieves the symptoms. Past workup does not include surgery. Her past medical history does not include PUD, gallstones, ulcerative colitis or Crohn's disease.  Sore Throat  Associated symptoms include abdominal pain.    Past Medical History:  Diagnosis Date  . Asthma   . Infection    UTI  . Syphilis    treated  . Vaginal Pap smear, abnormal    f/u ok    There are no active problems to display for this patient.   Past Surgical History:  Procedure Laterality Date  . NO PAST SURGERIES       OB History    Gravida  1   Para  0   Term  0   Preterm  0   AB  1   Living  0     SAB  1   TAB  0   Ectopic  0   Multiple  0   Live Births               Home Medications    Prior to Admission medications   Medication Sig Start Date End Date Taking? Authorizing Provider  albuterol (PROVENTIL HFA;VENTOLIN HFA) 108 (90 BASE) MCG/ACT inhaler  Inhale 2 puffs into the lungs every 4 (four) hours as needed for wheezing or shortness of breath. 03/13/14   Horton, Mayer Maskerourtney F, MD  cyclobenzaprine (FLEXERIL) 10 MG tablet Take 1 tablet (10 mg total) by mouth 2 (two) times daily as needed for muscle spasms. Patient not taking: Reported on 04/20/2017 02/22/17   Janne NapoleonNeese, Hope M, NP  diclofenac (VOLTAREN) 50 MG EC tablet Take 1 tablet (50 mg total) by mouth 2 (two) times daily. Patient not taking: Reported on 04/20/2017 02/22/17   Janne NapoleonNeese, Hope M, NP  ferrous sulfate 325 (65 FE) MG tablet Take 1 tablet (325 mg total) by mouth daily. 04/20/17   Muthersbaugh, Dahlia ClientHannah, PA-C  ibuprofen (ADVIL,MOTRIN) 600 MG tablet Take 1 tablet (600 mg total) by mouth every 6 (six) hours as needed. Patient not taking: Reported on 04/20/2017 01/15/17   Jacalyn LefevreHaviland, Julie, MD  predniSONE (DELTASONE) 10 MG tablet Take 2 tablets (20 mg total) by mouth 2 (two) times daily with a meal. Patient not taking: Reported on 04/20/2017 02/22/17   Janne NapoleonNeese, Hope M, NP    Family History Family History  Problem Relation Age of Onset  . Hyperlipidemia Mother   . Mental illness Mother   . Hypertension  Mother   . Diabetes Father   . Cancer Maternal Aunt   . Cancer Paternal Uncle   . Anesthesia problems Neg Hx     Social History Social History   Tobacco Use  . Smoking status: Current Every Day Smoker    Packs/day: 1.00    Years: 22.00    Pack years: 22.00    Types: Cigarettes  . Smokeless tobacco: Never Used  Substance Use Topics  . Alcohol use: Yes    Comment: Once a month   . Drug use: No     Allergies   Penicillins   Review of Systems Review of Systems  Constitutional: Negative for fever.  Gastrointestinal: Positive for abdominal pain, anorexia, diarrhea, nausea and vomiting. Negative for constipation.  Genitourinary: Negative for dysuria and frequency.  All other systems reviewed and are negative.    Physical Exam Updated Vital Signs BP (!) 158/79   Pulse 85   Temp 98.1  F (36.7 C) (Oral)   Resp 20   SpO2 96%   Physical Exam  Constitutional: She is oriented to person, place, and time. She appears well-developed and well-nourished. No distress.  HENT:  Head: Normocephalic and atraumatic.  Mouth/Throat: Oropharynx is clear and moist.  Eyes: Pupils are equal, round, and reactive to light. Conjunctivae and EOM are normal.  Neck: Normal range of motion. Neck supple.  Cardiovascular: Normal rate, regular rhythm and intact distal pulses.  No murmur heard. Pulmonary/Chest: Effort normal and breath sounds normal. No respiratory distress. She has no wheezes. She has no rales.  Abdominal: Soft. She exhibits no distension. There is tenderness in the right lower quadrant. There is guarding. There is no rebound. No hernia.  Musculoskeletal: Normal range of motion. She exhibits no edema or tenderness.  Neurological: She is alert and oriented to person, place, and time.  Skin: Skin is warm and dry. No rash noted. No erythema.  Psychiatric: She has a normal mood and affect. Her behavior is normal.  Nursing note and vitals reviewed.    ED Treatments / Results  Labs (all labs ordered are listed, but only abnormal results are displayed) Labs Reviewed  COMPREHENSIVE METABOLIC PANEL - Abnormal; Notable for the following components:      Result Value   Calcium 8.8 (*)    ALT 13 (*)    All other components within normal limits  CBC - Abnormal; Notable for the following components:   Hemoglobin 11.9 (*)    All other components within normal limits  URINALYSIS, ROUTINE W REFLEX MICROSCOPIC - Abnormal; Notable for the following components:   APPearance HAZY (*)    All other components within normal limits  GROUP A STREP BY PCR  LIPASE, BLOOD  I-STAT BETA HCG BLOOD, ED ([facility], [facility], [facility] ONLY)    EKG None  Radiology Ct Abdomen Pelvis W Contrast  Result Date: 06/26/2017 CLINICAL DATA:  Right lower quadrant abdominal pain with fever EXAM: CT ABDOMEN AND PELVIS WITH  CONTRAST TECHNIQUE: Multidetector CT imaging of the abdomen and pelvis was performed using the standard protocol following bolus administration of intravenous contrast. CONTRAST:  OMNIPAQUE IOHEXOL 300 MG/ML  SOLN COMPARISON:  CT 07/31/2015 FINDINGS: Lower chest: No acute abnormality. Hepatobiliary: No focal liver abnormality is seen. No gallstones, gallbladder wall thickening, or biliary dilatation. Pancreas: Unremarkable. No pancreatic ductal dilatation or surrounding inflammatory changes. Spleen: Normal in size without focal abnormality. Adrenals/Urinary Tract: Adrenal glands are unremarkable. Kidneys are normal, without renal calculi, focal lesion, or hydronephrosis. Bladder is unremarkable. Stomach/Bowel:  Stomach is within normal limits. Appendix appears normal. No evidence of bowel wall thickening, distention, or inflammatory changes. Vascular/Lymphatic: No significant vascular findings are present. No enlarged abdominal or pelvic lymph nodes. Reproductive: Uterus and bilateral adnexa are unremarkable. Other: Negative for free air or free fluid. Musculoskeletal: Prominent facet irregularity and degenerative change at L4-L5. IMPRESSION: 1. No CT evidence for acute intra-abdominal or pelvic abnormality. 2. Negative for appendicitis Electronically Signed   By: Jasmine Pang M.D.   On: 06/26/2017 03:36    Procedures Procedures (including critical care time)  Medications Ordered in ED Medications  ondansetron (ZOFRAN) injection 4 mg (has no administration in time range)  morphine 4 MG/ML injection 4 mg (has no administration in time range)  sodium chloride 0.9 % bolus 1,000 mL (has no administration in time range)  0.9 %  sodium chloride infusion (has no administration in time range)     Initial Impression / Assessment and Plan / ED Course  I have reviewed the triage vital signs and the nursing notes.  Pertinent labs & imaging results that were available during my care of the patient were  reviewed by me and considered in my medical decision making (see chart for details).     Recent presenting today with 1 week of right lower quadrant pain nausea and throat burning.  Patient denies any URI symptoms, fever.  The last 2 days she has had diarrhea but denies any urinary or vaginal symptoms.  On exam patient does have guarding and right lower quadrant tenderness.  This could be ovarian cyst versus appendicitis versus diverticulitis.  Low suspicion for hepatitis or pancreatitis.  Patient's lipase and LFTs are within normal limits.  CBC, UA, urine pregnancy and strep testing is all negative.  Patient's throat without evidence of pustules or erythema.  Feel her throat burning may be related to GERD. Given IV fluids, pain and nausea medication.  CT pending  4:34 AM CT neg.  Will send pt home with h2 blocker and return precautions.  Final Clinical Impressions(s) / ED Diagnoses   Final diagnoses:  Right lower quadrant abdominal pain  Sore throat    ED Discharge Orders        Ordered    famotidine (PEPCID) 20 MG tablet  2 times daily     06/26/17 0432       Gwyneth Sprout, MD 06/26/17 [phone]

## 2017-08-28 ENCOUNTER — Encounter (HOSPITAL_COMMUNITY): Payer: Self-pay

## 2017-08-28 ENCOUNTER — Other Ambulatory Visit: Payer: Self-pay

## 2017-08-28 ENCOUNTER — Emergency Department (HOSPITAL_COMMUNITY)
Admission: EM | Admit: 2017-08-28 | Discharge: 2017-08-28 | Disposition: A | Discharge status: Home / Self Care | Payer: PRIVATE HEALTH INSURANCE | Attending: Emergency Medicine | Admitting: Emergency Medicine

## 2017-08-28 DIAGNOSIS — Z79899 Other long term (current) drug therapy: Secondary | ICD-10-CM | POA: Diagnosis not present

## 2017-08-28 DIAGNOSIS — M25551 Pain in right hip: Secondary | ICD-10-CM | POA: Diagnosis not present

## 2017-08-28 DIAGNOSIS — J45909 Unspecified asthma, uncomplicated: Secondary | ICD-10-CM | POA: Diagnosis not present

## 2017-08-28 DIAGNOSIS — F17210 Nicotine dependence, cigarettes, uncomplicated: Secondary | ICD-10-CM | POA: Insufficient documentation

## 2017-08-28 MED ORDER — HYDROCODONE-ACETAMINOPHEN 5-325 MG PO TABS: 2.0000 | ORAL_TABLET | ORAL | 0 refills | Status: DC | PRN

## 2017-08-28 MED ORDER — NAPROXEN 375 MG PO TABS: ORAL_TABLET | ORAL | 0 refills | Status: DC

## 2017-08-28 MED ORDER — HYDROCODONE-ACETAMINOPHEN 5-325 MG PO TABS
1.0000 | ORAL_TABLET | Freq: Once | ORAL | Status: AC
  Administered 2017-08-28: 1 via ORAL
  Filled 2017-08-28: qty 1

## 2017-08-28 NOTE — ED Triage Notes (Signed)
Per pt  Stated that she has pain and bruise on the back of the right leg. Pain extends from the the hip to the foot. Pain began one week ago, and is painful to walk and stand on. Leg pain stated 8/10. When examinated no swelling or warmth noted. Pt is hypertensive.  ZT

## 2017-08-28 NOTE — ED Provider Notes (Addendum)
[facility] Provider Note: Lowella DellJ. Lane Aviyana Sonntag, MD, FACEP  CSN: [id number] MRN: [id number] ARRIVAL: 08/28/17 at 0019 ROOM: WA09/WA09   CHIEF COMPLAINT  Hip Pain   HISTORY OF PRESENT ILLNESS  08/28/17 3:15 AM Sherri Reid is a 42 y.o. female with a 2 to 3-day history of pain in her right hip.  Specifically the pain is located primarily anteriorly with some posterior involvement.  The pain radiates down the lateral aspect of her right leg.  She rates the pain as an 8 out of 10.  Pain is worse with movement or weightbearing.  She denies injury.  She has taken 800 mg of ibuprofen without adequate relief.   Past Medical History:  Diagnosis Date  . Asthma   . Infection    UTI  . Syphilis    treated  . Vaginal Pap smear, abnormal    f/u ok    Past Surgical History:  Procedure Laterality Date  . NO PAST SURGERIES      Family History  Problem Relation Age of Onset  . Hyperlipidemia Mother   . Mental illness Mother   . Hypertension Mother   . Diabetes Father   . Cancer Maternal Aunt   . Cancer Paternal Uncle   . Anesthesia problems Neg Hx     Social History   Tobacco Use  . Smoking status: Current Every Day Smoker    Packs/day: 1.00    Years: 22.00    Pack years: 22.00    Types: Cigarettes  . Smokeless tobacco: Never Used  Substance Use Topics  . Alcohol use: Yes    Comment: Once a month   . Drug use: No    Prior to Admission medications   Medication Sig Start Date End Date Taking? Authorizing Provider  albuterol (PROVENTIL HFA;VENTOLIN HFA) 108 (90 BASE) MCG/ACT inhaler Inhale 2 puffs into the lungs every 4 (four) hours as needed for wheezing or shortness of breath. Patient not taking: Reported on 08/28/2017 03/13/14   Horton, Mayer Maskerourtney F, MD  cyclobenzaprine (FLEXERIL) 10 MG tablet Take 1 tablet (10 mg total) by mouth 2 (two) times daily as needed for muscle spasms. Patient not taking: Reported on 04/20/2017 02/22/17   Janne NapoleonNeese, Hope M, NP  diclofenac (VOLTAREN)  50 MG EC tablet Take 1 tablet (50 mg total) by mouth 2 (two) times daily. Patient not taking: Reported on 04/20/2017 02/22/17   Janne NapoleonNeese, Hope M, NP  famotidine (PEPCID) 20 MG tablet Take 1 tablet (20 mg total) by mouth 2 (two) times daily. Patient not taking: Reported on 08/28/2017 06/26/17   Gwyneth SproutPlunkett, Whitney, MD  ferrous sulfate 325 (65 FE) MG tablet Take 1 tablet (325 mg total) by mouth daily. Patient not taking: Reported on 08/28/2017 04/20/17   Muthersbaugh, Dahlia ClientHannah, PA-C  ibuprofen (ADVIL,MOTRIN) 600 MG tablet Take 1 tablet (600 mg total) by mouth every 6 (six) hours as needed. Patient not taking: Reported on 04/20/2017 01/15/17   Jacalyn LefevreHaviland, Julie, MD  predniSONE (DELTASONE) 10 MG tablet Take 2 tablets (20 mg total) by mouth 2 (two) times daily with a meal. Patient not taking: Reported on 04/20/2017 02/22/17   Janne NapoleonNeese, Hope M, NP    Allergies Penicillins   REVIEW OF SYSTEMS  Negative except as noted here or in the History of Present Illness.   PHYSICAL EXAMINATION  Initial Vital Signs Blood pressure 132/78, pulse 79, temperature 98.3 F (36.8 C), temperature source Oral, resp. rate 18, height 5\' 2"  (1.575 m), weight 102.1 kg, last menstrual period 07/26/2017, SpO2 100 %.  Examination General: Well-developed, well-nourished female in no acute distress; appearance consistent with age of record HENT: normocephalic; atraumatic Eyes: pupils equal, round and reactive to light; extraocular muscles intact Neck: supple Heart: regular rate and rhythm Lungs: clear to auscultation bilaterally Abdomen: soft; nondistended; nontender; bowel sounds present Back: Right lateral paralumbar tenderness, right groin fold tenderness, positive straight leg raise on the right with pain radiating to the lateral right leg Extremities: No deformity; full range of motion; pulses normal; no edema; no calf tenderness Neurologic: Awake, alert and oriented; motor function intact in all extremities and symmetric; no facial  droop Skin: Warm and dry; superficial varicose veins of the right posterior lateral thigh Psychiatric: Normal mood and affect   RESULTS  Summary of this visit's results, reviewed by myself:   EKG Interpretation  Date/Time:    Ventricular Rate:    PR Interval:    QRS Duration:   QT Interval:    QTC Calculation:   R Axis:     Text Interpretation:        Laboratory Studies: No results found for this or any previous visit (from the past 24 hour(s)). Imaging Studies: No results found.  ED COURSE and MDM  Nursing notes and initial vitals signs, including pulse oximetry, reviewed.  Vitals:   08/28/17 0037 08/28/17 0043 08/28/17 0250  BP: (!) 155/81  132/78  Pulse: 80  79  Resp: 19  18  Temp: 98.3 F (36.8 C)    TempSrc: Oral    SpO2: 100%  100%  Weight:  102.1 kg   Height:  5\' 2"  (1.575 m)    Wells criteria -2.  Symptoms were consistent with sciatica, although the pattern is atypical.  We will treat her pain and refer to orthopedics if symptoms persist.  Consultation with the [facility] state controlled substances database reveals the patient has received no opioid prescriptions in the past 2 years.   PROCEDURES    ED DIAGNOSES     ICD-10-CM   1. Right hip pain M25.551        Toshika Parrow, Jonny Ruiz, MD 08/28/17 0331    Paula Libra, MD 08/28/17 [phone]

## 2017-09-18 ENCOUNTER — Emergency Department (HOSPITAL_COMMUNITY)
Admission: EM | Admit: 2017-09-18 | Discharge: 2017-09-18 | Disposition: A | Discharge status: Home / Self Care | Payer: PRIVATE HEALTH INSURANCE | Attending: Emergency Medicine | Admitting: Emergency Medicine

## 2017-09-18 ENCOUNTER — Encounter (HOSPITAL_COMMUNITY): Payer: Self-pay | Admitting: *Deleted

## 2017-09-18 ENCOUNTER — Emergency Department (HOSPITAL_COMMUNITY): Payer: PRIVATE HEALTH INSURANCE

## 2017-09-18 ENCOUNTER — Other Ambulatory Visit: Payer: Self-pay

## 2017-09-18 DIAGNOSIS — R112 Nausea with vomiting, unspecified: Secondary | ICD-10-CM | POA: Diagnosis present

## 2017-09-18 DIAGNOSIS — R300 Dysuria: Secondary | ICD-10-CM | POA: Insufficient documentation

## 2017-09-18 DIAGNOSIS — F17210 Nicotine dependence, cigarettes, uncomplicated: Secondary | ICD-10-CM | POA: Insufficient documentation

## 2017-09-18 DIAGNOSIS — J45909 Unspecified asthma, uncomplicated: Secondary | ICD-10-CM | POA: Diagnosis not present

## 2017-09-18 LAB — COMPREHENSIVE METABOLIC PANEL
ALK PHOS: 51 U/L (ref 38–126)
ALT: 12 U/L (ref 0–44)
ANION GAP: 5 (ref 5–15)
AST: 17 U/L (ref 15–41)
Albumin: 3.4 g/dL — ABNORMAL LOW (ref 3.5–5.0)
BUN: 11 mg/dL (ref 6–20)
CALCIUM: 8.4 mg/dL — AB (ref 8.9–10.3)
CO2: 25 mmol/L (ref 22–32)
Chloride: 108 mmol/L (ref 98–111)
Creatinine, Ser: 1.04 mg/dL — ABNORMAL HIGH (ref 0.44–1.00)
GFR calc non Af Amer: >60 mL/min (ref >60)
Glucose, Bld: 78 mg/dL (ref 70–99)
POTASSIUM: 4.4 mmol/L (ref 3.5–5.1)
Sodium: 138 mmol/L (ref 135–145)
TOTAL PROTEIN: 6.3 g/dL — AB (ref 6.5–8.1)
Total Bilirubin: 0.6 mg/dL (ref 0.3–1.2)

## 2017-09-18 LAB — CBC
HEMATOCRIT: 36.1 % (ref 36.0–46.0)
HEMOGLOBIN: 11 g/dL — AB (ref 12.0–15.0)
MCH: 26.2 pg (ref 26.0–34.0)
MCHC: 30.5 g/dL (ref 30.0–36.0)
MCV: 86 fL (ref 78.0–100.0)
Platelets: 276 10*3/uL (ref 150–400)
RBC: 4.2 MIL/uL (ref 3.87–5.11)
RDW: 13.4 % (ref 11.5–15.5)
WBC: 6.5 10*3/uL (ref 4.0–10.5)

## 2017-09-18 LAB — URINALYSIS, ROUTINE W REFLEX MICROSCOPIC
Bilirubin Urine: NEGATIVE
Glucose, UA: NEGATIVE mg/dL
Hgb urine dipstick: NEGATIVE
Ketones, ur: NEGATIVE mg/dL
Leukocytes, UA: NEGATIVE
NITRITE: NEGATIVE
PH: 7 (ref 5.0–8.0)
Protein, ur: NEGATIVE mg/dL
SPECIFIC GRAVITY, URINE: 1.02 (ref 1.005–1.030)

## 2017-09-18 LAB — LIPASE, BLOOD: Lipase: 33 U/L (ref 11–51)

## 2017-09-18 LAB — I-STAT BETA HCG BLOOD, ED (MC, WL, AP ONLY)

## 2017-09-18 MED ORDER — ONDANSETRON 4 MG PO TBDP
4.0000 mg | ORAL_TABLET | Freq: Three times a day (TID) | ORAL | 0 refills | Status: DC | PRN
Start: 2017-09-18 — End: 2017-10-19

## 2017-09-18 MED ORDER — SODIUM CHLORIDE 0.9 % IV BOLUS
1000.0000 mL | Freq: Once | INTRAVENOUS | Status: AC
  Administered 2017-09-18: 1000 mL via INTRAVENOUS

## 2017-09-18 MED ORDER — ONDANSETRON HCL 4 MG/2ML IJ SOLN
4.0000 mg | Freq: Once | INTRAMUSCULAR | Status: AC
  Administered 2017-09-18: 4 mg via INTRAVENOUS
  Filled 2017-09-18: qty 2

## 2017-09-18 NOTE — ED Notes (Signed)
Onset of emesis - onset x3 days ago - last emesis 0900 this am - reports noticed blood streaks in emesis this am - no h/o same - last normal BM today - states normal for her; denies abd pain - states urine has "strong" odor to it; reports decreased fluid intake as well 2/2 emesis

## 2017-09-18 NOTE — ED Notes (Signed)
Pt  Needs  The iv fluid that is going in very slowly  Given gingerale

## 2017-09-18 NOTE — ED Provider Notes (Signed)
[facility] EMERGENCY DEPARTMENT Provider Note   CSN: [id number] Arrival date & time: 09/18/17  1015     History   Chief Complaint Chief Complaint  Patient presents with  . Emesis  . Constipation    HPI Sherri Reid is a 42 y.o. female.  The history is provided by the patient.  Emesis   This is a new problem. The current episode started more than 2 days ago. The problem occurs 2 to 4 times per day. The problem has been gradually worsening. The emesis has an appearance of stomach contents (with bright red streaks). There has been no fever. Associated symptoms include chills. Pertinent negatives include no abdominal pain, no cough, no diarrhea, no fever and no headaches.    Past Medical History:  Diagnosis Date  . Asthma   . Infection    UTI  . Syphilis    treated  . Vaginal Pap smear, abnormal    f/u ok    There are no active problems to display for this patient.   Past Surgical History:  Procedure Laterality Date  . NO PAST SURGERIES       OB History    Gravida  1   Para  0   Term  0   Preterm  0   AB  1   Living  0     SAB  1   TAB  0   Ectopic  0   Multiple  0   Live Births               Home Medications    Prior to Admission medications   Medication Sig Start Date End Date Taking? Authorizing Provider  HYDROcodone-acetaminophen (NORCO) 5-325 MG tablet Take 2 tablets by mouth every 4 (four) hours as needed for severe pain. 08/28/17  Yes Molpus, John, MD  naproxen (NAPROSYN) 375 MG tablet Take 1 tablet twice daily as needed for pain. 08/28/17  Yes Molpus, Jonny Ruiz, MD  ondansetron (ZOFRAN ODT) 4 MG disintegrating tablet Take 1 tablet (4 mg total) by mouth every 8 (eight) hours as needed for nausea or vomiting. 09/18/17   Nash Dimmer, MD    Family History Family History  Problem Relation Age of Onset  . Hyperlipidemia Mother   . Mental illness Mother   . Hypertension Mother   . Diabetes Father   . Cancer  Maternal Aunt   . Cancer Paternal Uncle   . Anesthesia problems Neg Hx     Social History Social History   Tobacco Use  . Smoking status: Current Every Day Smoker    Packs/day: 1.00    Years: 22.00    Pack years: 22.00    Types: Cigarettes  . Smokeless tobacco: Never Used  Substance Use Topics  . Alcohol use: Yes    Comment: Once a month   . Drug use: No     Allergies   Penicillins   Review of Systems Review of Systems  Constitutional: Positive for chills. Negative for fever.  HENT: Negative for congestion and rhinorrhea.   Eyes: Negative for pain.  Respiratory: Negative for cough and shortness of breath.   Cardiovascular: Negative for chest pain.  Gastrointestinal: Positive for constipation (straining to have BMs) and vomiting. Negative for abdominal pain and diarrhea.  Genitourinary: Positive for decreased urine volume, dysuria and urgency. Negative for frequency.  Musculoskeletal: Negative for back pain and neck pain.  Skin: Negative for wound.  Neurological: Negative for headaches.     Physical  Exam Updated Vital Signs BP (!) 157/85   Pulse 68   Temp 98.1 F (36.7 C) (Oral)   Resp 16   LMP 08/27/2017 Comment: "absolutely no chance of pregnancy", per pt  SpO2 100%   Physical Exam  Constitutional: She appears well-developed and well-nourished. No distress.  HENT:  Head: Normocephalic and atraumatic.  Mouth/Throat: Oropharynx is clear and moist. No oropharyngeal exudate.  Eyes: Conjunctivae are normal.  Neck: Neck supple.  Cardiovascular: Normal rate and regular rhythm.  No murmur heard. Pulmonary/Chest: Effort normal and breath sounds normal. No respiratory distress.  Abdominal: Soft. There is no tenderness. There is no rigidity, no rebound, no guarding, no CVA tenderness, no tenderness at McBurney's point and negative Murphy's sign. No hernia.  Musculoskeletal: She exhibits no edema.  Neurological: She is alert.  Skin: Skin is warm and dry. Capillary  refill takes less than 2 seconds.  Psychiatric: She has a normal mood and affect.  Nursing note and vitals reviewed.    ED Treatments / Results  Labs (all labs ordered are listed, but only abnormal results are displayed) Labs Reviewed  COMPREHENSIVE METABOLIC PANEL - Abnormal; Notable for the following components:      Result Value   Creatinine, Ser 1.04 (*)    Calcium 8.4 (*)    Total Protein 6.3 (*)    Albumin 3.4 (*)    All other components within normal limits  CBC - Abnormal; Notable for the following components:   Hemoglobin 11.0 (*)    All other components within normal limits  LIPASE, BLOOD  URINALYSIS, ROUTINE W REFLEX MICROSCOPIC  I-STAT BETA HCG BLOOD, ED ([facility], [facility], [facility] ONLY)    EKG None  Radiology Dg Chest 2 View  Result Date: 09/18/2017 CLINICAL DATA:  Nausea and vomiting with chest pain EXAM: CHEST - 2 VIEW COMPARISON:  January 15, 2017 FINDINGS: No edema or consolidation. Heart size and pulmonary vascularity are normal. Mediastinum does not appear appreciably widened. There is no pneumomediastinum or pneumothorax evident. No bone lesions. IMPRESSION: No edema or consolidation. No pneumothorax or pneumomediastinum evident. Cardiac silhouette within normal limits. Electronically Signed   By: Bretta Bang III M.D.   On: 09/18/2017 16:32   Dg Abdomen 1 View  Result Date: 09/18/2017 CLINICAL DATA:  Constipation and vomiting. EXAM: ABDOMEN - 1 VIEW COMPARISON:  Body CT 06/26/2017 FINDINGS: The bowel gas pattern is normal. No radio-opaque calculi or other significant radiographic abnormality are seen. IMPRESSION: Negative. Electronically Signed   By: Ted Mcalpine M.D.   On: 09/18/2017 11:08    Procedures Procedures (including critical care time)  Medications Ordered in ED Medications  sodium chloride 0.9 % bolus 1,000 mL (0 mLs Intravenous Stopped 09/18/17 2028)  ondansetron (ZOFRAN) injection 4 mg (4 mg Intravenous Given 09/18/17 1602)     Initial  Impression / Assessment and Plan / ED Course  I have reviewed the triage vital signs and the nursing notes.  Pertinent labs & imaging results that were available during my care of the patient were reviewed by me and considered in my medical decision making (see chart for details).     The patient is a 42 year old female with no significant past medical history who presents with 3 days of vomiting and constipation.  The patient also reports decreased urine output.  She endorses mild dysuria.  She became concerned this morning when she had bright red streaks of her vomit.  The patient reports that she has not been able to tolerate anything p.o. including  liquids.  Patient denies chest pain or shortness of breath, she endorses a sore throat.  She has been having daily bowel movements, just reports that she has to strain.  Do not suspect SBO, as patient still having bowel movements.  Do not suspect Boerhaave as patient is not having chest pain and chest x-ray shows no mediastinal free air.  Reports improvement in her symptoms after Zofran and 1 L of IV fluids. Suspect mallory-weiss tear given history, physical exam, and labs. Patient discharged and given strict return precautions. Patient reports understanding and agreement with discharge precautions and instructions.  Patient care supervised by Dr. Clayborne DanaMesner.  Nash DimmerJulia Rivers Gassmann, MD  Final Clinical Impressions(s) / ED Diagnoses   Final diagnoses:  Non-intractable vomiting with nausea, unspecified vomiting type    ED Discharge Orders         Ordered    ondansetron (ZOFRAN ODT) 4 MG disintegrating tablet  Every 8 hours PRN     09/18/17 1926           Nash DimmerProkesova, Caylea Foronda, MD 09/19/17 [id number]    Marily MemosMesner, Jason, MD 09/19/17 1857

## 2017-09-18 NOTE — ED Notes (Signed)
Pt aware of need for urine specimen - states will notify staff when able to go

## 2017-09-18 NOTE — ED Provider Notes (Signed)
I saw and evaluated the patient, reviewed the resident's note and I agree with the findings and plan with the following exceptions.   42 year old female here with a couple days of decreased bowel movements and multiple episodes of vomiting but the last one had some blood streaking in it.  Other than that it was just stomach contents.  No dark stools.  On my evaluation the patient has normal vital signs.  Nontender abdomen.  Overall appears slightly dry but well.  Plan for fluids and antiemetics and when tolerating p.o. can go home.  Low suspicion for bowel obstruction, intra-abdominal pathology or Boerhaave's.     Marily MemosMesner, Zakiah Beckerman, MD 09/19/17 [phone]

## 2017-09-18 NOTE — ED Notes (Signed)
Up to restroom to obtain urine specimen  

## 2017-09-18 NOTE — ED Triage Notes (Signed)
Pt in reporting constipation since Tuesday and vomiting that started Wednesday, noted bright red streaks in her vomit this morning, reports sore throat, denies abdominal pain, no distress noted

## 2017-10-19 ENCOUNTER — Encounter (HOSPITAL_COMMUNITY): Payer: Self-pay

## 2017-10-19 ENCOUNTER — Ambulatory Visit (HOSPITAL_COMMUNITY)
Admission: EM | Admit: 2017-10-19 | Discharge: 2017-10-19 | Disposition: A | Discharge status: Home / Self Care | Payer: PRIVATE HEALTH INSURANCE | Attending: Family Medicine | Admitting: Family Medicine

## 2017-10-19 DIAGNOSIS — G43001 Migraine without aura, not intractable, with status migrainosus: Secondary | ICD-10-CM

## 2017-10-19 MED ORDER — BUTALBITAL-APAP-CAFFEINE 50-325-40 MG PO TABS: 1.0000 | ORAL_TABLET | Freq: Four times a day (QID) | ORAL | 0 refills | Status: DC | PRN

## 2017-10-19 MED ORDER — DEXAMETHASONE SODIUM PHOSPHATE 10 MG/ML IJ SOLN
10.0000 mg | Freq: Once | INTRAMUSCULAR | Status: AC
  Administered 2017-10-19: 10 mg via INTRAMUSCULAR

## 2017-10-19 MED ORDER — DEXAMETHASONE SODIUM PHOSPHATE 10 MG/ML IJ SOLN
INTRAMUSCULAR | Status: AC
  Filled 2017-10-19: qty 1

## 2017-10-19 NOTE — ED Provider Notes (Signed)
[facility]    CSN: [id number] Arrival date & time: 10/19/17  1644     History   Chief Complaint Chief Complaint  Patient presents with  . Headache    HPI Sherri Reid is a 42 y.o. female.   Pt presents with ongoing headache, sensitivity to light and nausea.  Patient has had a headache like this before.  Is been going on for 2 days and is primarily a throbbing headache in the left frontal part of her head.  She has no ear pain, no stiff neck, no fever.  She has had some scotoma.  Patient works at 2 different [employer].  She is missing work today.   Note: Patient was seen last month for vomiting illness.     Past Medical History:  Diagnosis Date  . Asthma   . Infection    UTI  . Syphilis    treated  . Vaginal Pap smear, abnormal    f/u ok    There are no active problems to display for this patient.   Past Surgical History:  Procedure Laterality Date  . NO PAST SURGERIES      OB History    Gravida  1   Para  0   Term  0   Preterm  0   AB  1   Living  0     SAB  1   TAB  0   Ectopic  0   Multiple  0   Live Births               Home Medications    Prior to Admission medications   Medication Sig Start Date End Date Taking? Authorizing Provider  butalbital-acetaminophen-caffeine (FIORICET, ESGIC) 50-325-40 MG tablet Take 1-2 tablets by mouth every 6 (six) hours as needed for headache. 10/19/17 10/19/18  Elvina Sidle, MD    Family History Family History  Problem Relation Age of Onset  . Hyperlipidemia Mother   . Mental illness Mother   . Hypertension Mother   . Diabetes Father   . Cancer Maternal Aunt   . Cancer Paternal Uncle   . Anesthesia problems Neg Hx     Social History Social History   Tobacco Use  . Smoking status: Current Every Day Smoker    Packs/day: 1.00    Years: 22.00    Pack years: 22.00    Types: Cigarettes  . Smokeless tobacco: Never Used  Substance Use Topics  .  Alcohol use: Yes    Comment: Once a month   . Drug use: No     Allergies   Penicillins   Review of Systems Review of Systems   Physical Exam Triage Vital Signs ED Triage Vitals  Enc Vitals Group     BP 10/19/17 1722 (!) 162/83     Pulse Rate 10/19/17 1722 80     Resp 10/19/17 1722 20     Temp 10/19/17 1722 97.8 F (36.6 C)     Temp Source 10/19/17 1722 Oral     SpO2 10/19/17 1722 100 %     Weight --      Height --      Head Circumference --      Peak Flow --      Pain Score 10/19/17 1723 5     Pain Loc --      Pain Edu? --      Excl. in GC? --    No data found.  Updated Vital Signs BP Marland Kitchen)  162/83 (BP Location: Left Arm)   Pulse 80   Temp 97.8 F (36.6 C) (Oral)   Resp 20   LMP 10/11/2017   SpO2 100%    Physical Exam  Constitutional: She is oriented to person, place, and time. She appears well-developed and well-nourished.  HENT:  Head: Normocephalic and atraumatic.  Mouth/Throat: Oropharynx is clear and moist.  Eyes: Pupils are equal, round, and reactive to light. EOM are normal.  Neck: Normal range of motion. Neck supple.  Cardiovascular: Normal rate, regular rhythm and normal heart sounds.  Pulmonary/Chest: Effort normal.  Musculoskeletal: Normal range of motion.  Neurological: She is alert and oriented to person, place, and time. She has normal strength. She is not disoriented. No cranial nerve deficit or sensory deficit.  Skin: Skin is warm and dry.  Psychiatric: She has a normal mood and affect. Her behavior is normal.  Nursing note and vitals reviewed.    UC Treatments / Results  Labs (all labs ordered are listed, but only abnormal results are displayed) Labs Reviewed - No data to display  EKG None  Radiology No results found.  Procedures Procedures (including critical care time)  Medications Ordered in UC Medications  dexamethasone (DECADRON) injection 10 mg (has no administration in time range)    Initial Impression / Assessment  and Plan / UC Course  I have reviewed the triage vital signs and the nursing notes.  Pertinent labs & imaging results that were available during my care of the patient were reviewed by me and considered in my medical decision making (see chart for details).    Final Clinical Impressions(s) / UC Diagnoses   Final diagnoses:  Migraine without aura and with status migrainosus, not intractable   Discharge Instructions   None    ED Prescriptions    Medication Sig Dispense Auth. Provider   butalbital-acetaminophen-caffeine (FIORICET, ESGIC) 50-325-40 MG tablet Take 1-2 tablets by mouth every 6 (six) hours as needed for headache. 20 tablet Elvina Sidle, MD     Controlled Substance Prescriptions [place] Controlled Substance Registry consulted? Not Applicable   Elvina Sidle, MD 10/19/17 1750

## 2017-10-19 NOTE — ED Triage Notes (Signed)
Pt presents with ongoing headache, sensitivity to light and nausea.

## 2017-11-20 ENCOUNTER — Encounter (HOSPITAL_COMMUNITY): Payer: Self-pay

## 2017-11-20 ENCOUNTER — Emergency Department (HOSPITAL_COMMUNITY)
Admission: EM | Admit: 2017-11-20 | Discharge: 2017-11-20 | Disposition: A | Discharge status: Home / Self Care | Payer: PRIVATE HEALTH INSURANCE | Attending: Emergency Medicine | Admitting: Emergency Medicine

## 2017-11-20 DIAGNOSIS — J45909 Unspecified asthma, uncomplicated: Secondary | ICD-10-CM | POA: Diagnosis not present

## 2017-11-20 DIAGNOSIS — Z79899 Other long term (current) drug therapy: Secondary | ICD-10-CM | POA: Insufficient documentation

## 2017-11-20 DIAGNOSIS — K0889 Other specified disorders of teeth and supporting structures: Secondary | ICD-10-CM

## 2017-11-20 DIAGNOSIS — F17210 Nicotine dependence, cigarettes, uncomplicated: Secondary | ICD-10-CM | POA: Insufficient documentation

## 2017-11-20 MED ORDER — NAPROXEN 500 MG PO TABS: 500.0000 mg | ORAL_TABLET | Freq: Two times a day (BID) | ORAL | 0 refills | Status: DC

## 2017-11-20 MED ORDER — CLINDAMYCIN HCL 150 MG PO CAPS: 150.0000 mg | ORAL_CAPSULE | Freq: Four times a day (QID) | ORAL | 0 refills | Status: DC

## 2017-11-20 NOTE — ED Triage Notes (Signed)
Pt c/o left lower dental pain and metallic taste.  Pt has dentist but hasn't been able to get seen.

## 2017-11-20 NOTE — ED Provider Notes (Signed)
[facility] EMERGENCY DEPARTMENT Provider Note   CSN: [id number] Arrival date & time: 11/20/17  2051   History   Chief Complaint Chief Complaint  Patient presents with  . Dental Pain    HPI Sherri Reid is a 42 y.o. female with past medical history significant for asthma, syphilis which is been treated who presents for evaluation of dental pain.  Patient states she noticed pain to her back left tooth approximately 2 days ago.  Patient states she called her dentist and was unable to get in to see him today.  Patient states her pain is a 7/10.  Denies trismus, fever, chills, headache, facial swelling, difficulty tolerating p.o. intake, drooling, neck pain or neck stiffness.  States she does have a history of dental caries.  States that the tooth that is bothering her is 1 that she was told approximately 1 year ago that it would need to be removed, however she has not had this removed yet.  Denies oropharyngeal swelling.  Patient does have an drug reaction to penicillins.  Patient has not taken anything for pain.  Denies radiation of her pain.  Denies aggravating or alleviating factors.  Denies drooling or difficulty tolerating secretions.  History obtained from patient.  No interpreter was used.  HPI  Past Medical History:  Diagnosis Date  . Asthma   . Infection    UTI  . Syphilis    treated  . Vaginal Pap smear, abnormal    f/u ok    There are no active problems to display for this patient.   Past Surgical History:  Procedure Laterality Date  . NO PAST SURGERIES       OB History    Gravida  1   Para  0   Term  0   Preterm  0   AB  1   Living  0     SAB  1   TAB  0   Ectopic  0   Multiple  0   Live Births               Home Medications    Prior to Admission medications   Medication Sig Start Date End Date Taking? Authorizing Provider  butalbital-acetaminophen-caffeine (FIORICET, ESGIC) 50-325-40 MG tablet Take 1-2 tablets  by mouth every 6 (six) hours as needed for headache. 10/19/17 10/19/18  Elvina Sidle, MD  clindamycin (CLEOCIN) 150 MG capsule Take 1 capsule (150 mg total) by mouth every 6 (six) hours. 11/20/17   Ahilyn Nell A, PA-C  naproxen (NAPROSYN) 500 MG tablet Take 1 tablet (500 mg total) by mouth 2 (two) times daily. 11/20/17   Adrieana Fennelly A, PA-C    Family History Family History  Problem Relation Age of Onset  . Hyperlipidemia Mother   . Mental illness Mother   . Hypertension Mother   . Diabetes Father   . Cancer Maternal Aunt   . Cancer Paternal Uncle   . Anesthesia problems Neg Hx     Social History Social History   Tobacco Use  . Smoking status: Current Every Day Smoker    Packs/day: 1.00    Years: 22.00    Pack years: 22.00    Types: Cigarettes  . Smokeless tobacco: Never Used  Substance Use Topics  . Alcohol use: Yes    Comment: Once a month   . Drug use: No     Allergies   Penicillins   Review of Systems Review of Systems  Constitutional: Negative.  HENT: Positive for dental problem. Negative for congestion, drooling, ear discharge, ear pain, facial swelling, hearing loss, mouth sores, nosebleeds, postnasal drip, rhinorrhea, sinus pressure, sinus pain, sneezing, sore throat, tinnitus, trouble swallowing and voice change.   Respiratory: Negative for cough and shortness of breath.   Cardiovascular: Negative for chest pain.  Gastrointestinal: Negative for nausea and vomiting.  Musculoskeletal: Negative for neck pain and neck stiffness.  Skin: Negative.   Neurological: Negative for dizziness, weakness, light-headedness, numbness and headaches.  All other systems reviewed and are negative.    Physical Exam Updated Vital Signs BP (!) 147/96 (BP Location: Right Arm)   Pulse 93   Temp 98.6 F (37 C) (Oral)   Resp 16   SpO2 100%   Physical Exam  Constitutional: Vital signs are normal. She appears well-developed and well-nourished.  Non-toxic  appearance. She does not have a sickly appearance. She does not appear ill. No distress.  HENT:  Head: Normocephalic and atraumatic.  Right Ear: Tympanic membrane, external ear and ear canal normal. Tympanic membrane is not injected, not scarred, not perforated, not erythematous, not retracted and not bulging.  Left Ear: Tympanic membrane, external ear and ear canal normal. Tympanic membrane is not injected, not scarred, not perforated, not erythematous, not retracted and not bulging.  Nose: Nose normal. No mucosal edema, rhinorrhea, nose lacerations, sinus tenderness, nasal deformity, septal deviation or nasal septal hematoma. No epistaxis.  No foreign bodies. Right sinus exhibits no maxillary sinus tenderness and no frontal sinus tenderness. Left sinus exhibits no maxillary sinus tenderness and no frontal sinus tenderness.  Mouth/Throat: Uvula is midline, oropharynx is clear and moist and mucous membranes are normal. No oral lesions. No trismus in the jaw. Abnormal dentition. Dental caries present. No dental abscesses, uvula swelling or lacerations. No oropharyngeal exudate, posterior oropharyngeal edema, posterior oropharyngeal erythema or tonsillar abscesses.    Patient with overall poor dentition.  Patient with multiple dental caries.  No evidence of gross dental abscess.  Uvula midline without swelling.  No trismus.  Tonsils without erythema, edema or exudate.  Posterior oropharynx without erythema, edema, exudate.  No elevation of palate.  No submandibular swelling.  No dysphonia.  Patient does have mild erythema to surrounding gingiva to tooth #17.  No evidence of periapical abscess.  Tooth with dental carry at tooth #17 and #18.  Eyes: Pupils are equal, round, and reactive to light.  Neck: Normal range of motion, full passive range of motion without pain and phonation normal. Neck supple. No neck rigidity. No edema, no erythema and normal range of motion present.  No neck tenderness, stiffness  or rigidity.  Cardiovascular: Normal rate and intact distal pulses.  Pulmonary/Chest: No respiratory distress.  Abdominal: She exhibits no distension.  Musculoskeletal: Normal range of motion.  Neurological: She is alert.  Skin: Skin is warm and dry. She is not diaphoretic.  Psychiatric: She has a normal mood and affect.  Nursing note and vitals reviewed.    ED Treatments / Results  Labs (all labs ordered are listed, but only abnormal results are displayed) Labs Reviewed - No data to display  EKG None  Radiology No results found.  Procedures Procedures (including critical care time)  Medications Ordered in ED Medications - No data to display   Initial Impression / Assessment and Plan / ED Course  I have reviewed the triage vital signs and the nursing notes.  Pertinent labs & imaging results that were available during my care of the patient were reviewed by me  and considered in my medical decision making (see chart for details).  42 year old female who appears otherwise well presents for evaluation of dental pain.  Pain is been present x2 weeks.  Pain is located to tooth #17.  No evidence of gross periapical abscess.  She does have mild surrounding erythema to the gingiva of tooth #17.  Patient is overall poor dentition with multiple dental caries.  Uvula is midline without swelling.  No trismus, tonsils without erythema, edema or exudate.  Posterior oropharynx without erythema, edema or exudate.  No elevation of palate or soft tissues.  No submandibular swelling.  No dysphonia.  No neck tenderness or stiffness.  No drooling or difficulty tolerating secretions.  Patient does have allergy to penicillin.  Will DC home with clindamycin and close dental follow-up.  She does not have any abscess that needs to be drained at this time.  Low suspicion for Ludwig's angina or deep space infection.  Patient does have mildly elevated blood pressure in the department.  She does have history of  hypertension does not take medicines for this.  Denies headache, vision changes, chest pain or shortness of breath.  Discussed with patient follow-up with PCP for reevaluation of her blood pressure.  Discussed return precautions with patient.  Patient voiced understanding and is agreeable for follow-up.    Final Clinical Impressions(s) / ED Diagnoses   Final diagnoses:  Pain, dental    ED Discharge Orders         Ordered    naproxen (NAPROSYN) 500 MG tablet  2 times daily     11/20/17 2117    clindamycin (CLEOCIN) 150 MG capsule  Every 6 hours     11/20/17 2117           Jovana Rembold A, PA-C 11/20/17 2120    Vanetta Mulders, MD 11/24/17 1757

## 2017-11-20 NOTE — ED Notes (Signed)
Pt stable, ambulatory, states understanding of discharge instructions 

## 2017-11-20 NOTE — Discharge Instructions (Addendum)
Evaluated today for dental pain.  He did not have any abscess that needs to be drained, however he might have the beginnings of an infection.  I prescribed clindamycin as well as naproxen for pain.  Please take as prescribed.  Please follow-up with a dentist as soon as possible.  Turn to the ED for any worsening symptoms.

## 2017-12-28 ENCOUNTER — Emergency Department (HOSPITAL_COMMUNITY)
Admission: EM | Admit: 2017-12-28 | Discharge: 2017-12-28 | Disposition: A | Discharge status: Home / Self Care | Payer: PRIVATE HEALTH INSURANCE | Attending: Emergency Medicine | Admitting: Emergency Medicine

## 2017-12-28 ENCOUNTER — Other Ambulatory Visit: Payer: Self-pay

## 2017-12-28 ENCOUNTER — Encounter (HOSPITAL_COMMUNITY): Payer: Self-pay | Admitting: Emergency Medicine

## 2017-12-28 DIAGNOSIS — N611 Abscess of the breast and nipple: Secondary | ICD-10-CM | POA: Insufficient documentation

## 2017-12-28 DIAGNOSIS — F17210 Nicotine dependence, cigarettes, uncomplicated: Secondary | ICD-10-CM | POA: Diagnosis not present

## 2017-12-28 DIAGNOSIS — J45909 Unspecified asthma, uncomplicated: Secondary | ICD-10-CM | POA: Diagnosis not present

## 2017-12-28 DIAGNOSIS — Z91040 Latex allergy status: Secondary | ICD-10-CM | POA: Diagnosis not present

## 2017-12-28 DIAGNOSIS — Z79899 Other long term (current) drug therapy: Secondary | ICD-10-CM | POA: Diagnosis not present

## 2017-12-28 DIAGNOSIS — N644 Mastodynia: Secondary | ICD-10-CM | POA: Diagnosis present

## 2017-12-28 LAB — BASIC METABOLIC PANEL
Anion gap: 8 (ref 5–15)
BUN: 9 mg/dL (ref 6–20)
CHLORIDE: 106 mmol/L (ref 98–111)
CO2: 24 mmol/L (ref 22–32)
Calcium: 8.3 mg/dL — ABNORMAL LOW (ref 8.9–10.3)
Creatinine, Ser: 0.79 mg/dL (ref 0.44–1.00)
GFR calc Af Amer: >60 mL/min (ref >60)
GFR calc non Af Amer: >60 mL/min (ref >60)
Glucose, Bld: 103 mg/dL — ABNORMAL HIGH (ref 70–99)
Potassium: 3.4 mmol/L — ABNORMAL LOW (ref 3.5–5.1)
Sodium: 138 mmol/L (ref 135–145)

## 2017-12-28 LAB — CBC WITH DIFFERENTIAL/PLATELET
ABS IMMATURE GRANULOCYTES: 0.04 10*3/uL (ref 0.00–0.07)
Basophils Absolute: 0 10*3/uL (ref 0.0–0.1)
Basophils Relative: 0 %
Eosinophils Absolute: 0.1 10*3/uL (ref 0.0–0.5)
Eosinophils Relative: 1 %
HEMATOCRIT: 35.7 % — AB (ref 36.0–46.0)
HEMOGLOBIN: 11.3 g/dL — AB (ref 12.0–15.0)
Immature Granulocytes: 0 %
LYMPHS ABS: 2.2 10*3/uL (ref 0.7–4.0)
Lymphocytes Relative: 25 %
MCH: 26.5 pg (ref 26.0–34.0)
MCHC: 31.7 g/dL (ref 30.0–36.0)
MCV: 83.8 fL (ref 80.0–100.0)
Monocytes Absolute: 0.5 10*3/uL (ref 0.1–1.0)
Monocytes Relative: 6 %
Neutro Abs: 6.1 10*3/uL (ref 1.7–7.7)
Neutrophils Relative %: 68 %
Platelets: 263 10*3/uL (ref 150–400)
RBC: 4.26 MIL/uL (ref 3.87–5.11)
RDW: 13.3 % (ref 11.5–15.5)
WBC: 9 10*3/uL (ref 4.0–10.5)
nRBC: 0 % (ref 0.0–0.2)

## 2017-12-28 MED ORDER — LIDOCAINE HCL (PF) 1 % IJ SOLN
5.0000 mL | Freq: Once | INTRAMUSCULAR | Status: AC
  Administered 2017-12-28: 5 mL
  Filled 2017-12-28: qty 30

## 2017-12-28 MED ORDER — HYDROCODONE-ACETAMINOPHEN 5-325 MG PO TABS: 1.0000 | ORAL_TABLET | ORAL | 0 refills | Status: DC | PRN

## 2017-12-28 MED ORDER — SODIUM CHLORIDE 0.9 % IV SOLN
1.0000 g | Freq: Once | INTRAVENOUS | Status: AC
  Administered 2017-12-28: 1 g via INTRAVENOUS
  Filled 2017-12-28: qty 10

## 2017-12-28 MED ORDER — SULFAMETHOXAZOLE-TRIMETHOPRIM 800-160 MG PO TABS: 1.0000 | ORAL_TABLET | Freq: Two times a day (BID) | ORAL | 0 refills | Status: DC

## 2017-12-28 NOTE — Discharge Instructions (Addendum)
Recheck at Urgent care in 3 days

## 2017-12-28 NOTE — ED Triage Notes (Signed)
Patient is complaining of boil underneath right breast. Patient states it has popped but it closed back up. Patient states it is painful.

## 2017-12-28 NOTE — ED Provider Notes (Signed)
[facility]-EMERGENCY DEPT Provider Note   CSN: [id number] Arrival date & time: 12/28/17  0602     History   Chief Complaint Chief Complaint  Patient presents with  . Abscess    HPI Sherri Reid is a 42 y.o. female.  The history is provided by the patient. No language interpreter was used.  Abscess  Location:  Torso Torso abscess location:  R breast Size:  3 Abscess quality: induration, painful and redness   Red streaking: no   Progression:  Worsening Pain details:    Quality:  Aching   Timing:  Constant   Progression:  Worsening Chronicity:  New Context: not diabetes   Relieved by:  Nothing Pt reports she has an abscess on right breast.  Pt reports area was openning and draining.  Pt reports draining stopped but area has now had increased swelling  Past Medical History:  Diagnosis Date  . Asthma   . Infection    UTI  . Syphilis    treated  . Vaginal Pap smear, abnormal    f/u ok    There are no active problems to display for this patient.   Past Surgical History:  Procedure Laterality Date  . NO PAST SURGERIES       OB History    Gravida  1   Para  0   Term  0   Preterm  0   AB  1   Living  0     SAB  1   TAB  0   Ectopic  0   Multiple  0   Live Births               Home Medications    Prior to Admission medications   Medication Sig Start Date End Date Taking? Authorizing Provider  ibuprofen (ADVIL,MOTRIN) 200 MG tablet Take 600 mg by mouth every 6 (six) hours as needed for headache.   Yes [provider]    Family History Family History  Problem Relation Age of Onset  . Hyperlipidemia Mother   . Mental illness Mother   . Hypertension Mother   . Diabetes Father   . Cancer Maternal Aunt   . Cancer Paternal Uncle   . Anesthesia problems Neg Hx     Social History Social History   Tobacco Use  . Smoking status: Current Every Day Smoker    Packs/day: 1.00    Years: 22.00    Pack  years: 22.00    Types: Cigarettes  . Smokeless tobacco: Never Used  Substance Use Topics  . Alcohol use: Yes    Comment: Once a month   . Drug use: No     Allergies   Latex and Penicillins   Review of Systems Review of Systems  All other systems reviewed and are negative.    Physical Exam Updated Vital Signs BP (!) 157/81 (BP Location: Left Arm)   Pulse 88   Temp 99 F (37.2 C) (Oral)   Resp 18   Ht 5\' 2"  (1.575 m)   Wt 113.4 kg   LMP 12/27/2017   SpO2 100%   BMI 45.73 kg/m   Physical Exam HENT:     Head: Normocephalic.  Neck:     Musculoskeletal: Normal range of motion.  Pulmonary:     Effort: Pulmonary effort is normal.  Abdominal:     General: Abdomen is flat.  Musculoskeletal: Normal range of motion.  Skin:    General: Skin is warm.  Comments: 5cm area of erythema right breast, fluctuant   Neurological:     General: No focal deficit present.     Mental Status: She is alert.      ED Treatments / Results  Labs (all labs ordered are listed, but only abnormal results are displayed) Labs Reviewed  CBC WITH DIFFERENTIAL/PLATELET - Abnormal; Notable for the following components:      Result Value   Hemoglobin 11.3 (*)    HCT 35.7 (*)    All other components within normal limits  BASIC METABOLIC PANEL - Abnormal; Notable for the following components:   Potassium 3.4 (*)    Glucose, Bld 103 (*)    Calcium 8.3 (*)    All other components within normal limits    EKG None  Radiology No results found.  Procedures .Marland Kitchen.Incision and Drainage Date/Time: 12/28/2017 9:32 AM Performed by: Elson AreasSofia, Whitni Pasquini K, PA-C Authorized by: Elson AreasSofia, Mayli Covington K, PA-C   Consent:    Consent obtained:  Verbal   Consent given by:  Patient   Risks discussed:  Bleeding, incomplete drainage, pain and damage to other organs   Alternatives discussed:  No treatment Universal protocol:    Procedure explained and questions answered to patient or proxy's satisfaction: yes      Relevant documents present and verified: yes     Test results available and properly labeled: yes     Imaging studies available: yes     Required blood products, implants, devices, and special equipment available: yes     Site/side marked: yes     Immediately prior to procedure a time out was called: yes     Patient identity confirmed:  Verbally with patient Location:    Type:  Abscess   Size:  5   Location:  Trunk   Trunk location:  R breast Pre-procedure details:    Skin preparation:  Betadine Anesthesia (see MAR for exact dosages):    Anesthesia method:  Local infiltration   Local anesthetic:  Lidocaine 1% w/o epi Procedure type:    Complexity:  Complex Procedure details:    Needle aspiration: no     Incision types:  Single straight   Incision depth:  Subcutaneous   Scalpel blade:  11   Wound management:  Probed and deloculated, irrigated with saline and extensive cleaning   Drainage:  Purulent   Drainage amount:  Moderate   Wound treatment:  Wound left open   Packing materials:  1/4 in gauze Post-procedure details:    Patient tolerance of procedure:  Tolerated well, no immediate complications   (including critical care time)  Medications Ordered in ED Medications  lidocaine (PF) (XYLOCAINE) 1 % injection 5 mL (has no administration in time range)  cefTRIAXone (ROCEPHIN) 1 g in sodium chloride 0.9 % 100 mL IVPB (0 g Intravenous Stopped 12/28/17 0910)     Initial Impression / Assessment and Plan / ED Course  I have reviewed the triage vital signs and the nursing notes.  Pertinent labs & imaging results that were available during my care of the patient were reviewed by me and considered in my medical decision making (see chart for details).     Pt given Rocephin 1 gram IV.  Rx for bactrim and hydrocodone.  Pt advised to recheck at Urgent care in 3 days   Final Clinical Impressions(s) / ED Diagnoses   Final diagnoses:  Abscess of breast, right    ED Discharge  Orders         Ordered  sulfamethoxazole-trimethoprim (BACTRIM DS,SEPTRA DS) 800-160 MG tablet  2 times daily     12/28/17 0936    HYDROcodone-acetaminophen (NORCO/VICODIN) 5-325 MG tablet  Every 4 hours PRN     12/28/17 0936        An After Visit Summary was printed and given to the patient.    Elson Areas, [place] 12/28/17 4098    Arby Barrette, MD 12/28/17 1540

## 2017-12-28 NOTE — ED Notes (Signed)
Patient given discharge teaching and verbalized understanding. Patient ambulated out of ED with a steady gait. 

## 2018-01-02 ENCOUNTER — Emergency Department (HOSPITAL_COMMUNITY): Payer: PRIVATE HEALTH INSURANCE

## 2018-01-02 ENCOUNTER — Other Ambulatory Visit: Payer: Self-pay

## 2018-01-02 ENCOUNTER — Inpatient Hospital Stay (HOSPITAL_COMMUNITY)
Admission: EM | Admit: 2018-01-02 | Discharge: 2018-01-05 | DRG: 600 | Disposition: A | Discharge status: Home / Self Care | Payer: PRIVATE HEALTH INSURANCE | Attending: Internal Medicine | Admitting: Internal Medicine

## 2018-01-02 ENCOUNTER — Encounter (HOSPITAL_COMMUNITY): Payer: Self-pay | Admitting: Emergency Medicine

## 2018-01-02 DIAGNOSIS — Z6841 Body Mass Index (BMI) 40.0 and over, adult: Secondary | ICD-10-CM | POA: Diagnosis not present

## 2018-01-02 DIAGNOSIS — L0291 Cutaneous abscess, unspecified: Secondary | ICD-10-CM

## 2018-01-02 DIAGNOSIS — D638 Anemia in other chronic diseases classified elsewhere: Secondary | ICD-10-CM | POA: Diagnosis present

## 2018-01-02 DIAGNOSIS — Z9889 Other specified postprocedural states: Secondary | ICD-10-CM | POA: Diagnosis not present

## 2018-01-02 DIAGNOSIS — F17210 Nicotine dependence, cigarettes, uncomplicated: Secondary | ICD-10-CM | POA: Diagnosis present

## 2018-01-02 DIAGNOSIS — D649 Anemia, unspecified: Secondary | ICD-10-CM

## 2018-01-02 DIAGNOSIS — Z8249 Family history of ischemic heart disease and other diseases of the circulatory system: Secondary | ICD-10-CM

## 2018-01-02 DIAGNOSIS — Z8349 Family history of other endocrine, nutritional and metabolic diseases: Secondary | ICD-10-CM

## 2018-01-02 DIAGNOSIS — Z8619 Personal history of other infectious and parasitic diseases: Secondary | ICD-10-CM | POA: Diagnosis not present

## 2018-01-02 DIAGNOSIS — N179 Acute kidney failure, unspecified: Secondary | ICD-10-CM | POA: Diagnosis present

## 2018-01-02 DIAGNOSIS — Z818 Family history of other mental and behavioral disorders: Secondary | ICD-10-CM | POA: Diagnosis not present

## 2018-01-02 DIAGNOSIS — J45909 Unspecified asthma, uncomplicated: Secondary | ICD-10-CM | POA: Diagnosis present

## 2018-01-02 DIAGNOSIS — R1110 Vomiting, unspecified: Secondary | ICD-10-CM

## 2018-01-02 DIAGNOSIS — N61 Mastitis without abscess: Principal | ICD-10-CM | POA: Diagnosis present

## 2018-01-02 DIAGNOSIS — Z833 Family history of diabetes mellitus: Secondary | ICD-10-CM | POA: Diagnosis not present

## 2018-01-02 DIAGNOSIS — D72829 Elevated white blood cell count, unspecified: Secondary | ICD-10-CM

## 2018-01-02 DIAGNOSIS — E860 Dehydration: Secondary | ICD-10-CM | POA: Diagnosis present

## 2018-01-02 DIAGNOSIS — R112 Nausea with vomiting, unspecified: Secondary | ICD-10-CM | POA: Diagnosis not present

## 2018-01-02 DIAGNOSIS — N611 Abscess of the breast and nipple: Secondary | ICD-10-CM | POA: Diagnosis not present

## 2018-01-02 LAB — BASIC METABOLIC PANEL
Anion gap: 7 (ref 5–15)
BUN: 14 mg/dL (ref 6–20)
CO2: 24 mmol/L (ref 22–32)
Calcium: 8.4 mg/dL — ABNORMAL LOW (ref 8.9–10.3)
Chloride: 108 mmol/L (ref 98–111)
Creatinine, Ser: 1.21 mg/dL — ABNORMAL HIGH (ref 0.44–1.00)
GFR calc Af Amer: >60 mL/min (ref >60)
GFR calc non Af Amer: 55 mL/min — ABNORMAL LOW (ref >60)
GLUCOSE: 96 mg/dL (ref 70–99)
Potassium: 3.5 mmol/L (ref 3.5–5.1)
Sodium: 139 mmol/L (ref 135–145)

## 2018-01-02 LAB — CBC WITH DIFFERENTIAL/PLATELET
Abs Immature Granulocytes: 0.07 10*3/uL (ref 0.00–0.07)
Basophils Absolute: 0 10*3/uL (ref 0.0–0.1)
Basophils Relative: 0 %
Eosinophils Absolute: 0.1 10*3/uL (ref 0.0–0.5)
Eosinophils Relative: 1 %
HEMATOCRIT: 35.7 % — AB (ref 36.0–46.0)
HEMOGLOBIN: 11.1 g/dL — AB (ref 12.0–15.0)
Immature Granulocytes: 1 %
LYMPHS PCT: 14 %
Lymphs Abs: 2 10*3/uL (ref 0.7–4.0)
MCH: 25.9 pg — ABNORMAL LOW (ref 26.0–34.0)
MCHC: 31.1 g/dL (ref 30.0–36.0)
MCV: 83.2 fL (ref 80.0–100.0)
MONOS PCT: 6 %
Monocytes Absolute: 0.8 10*3/uL (ref 0.1–1.0)
Neutro Abs: 11.7 10*3/uL — ABNORMAL HIGH (ref 1.7–7.7)
Neutrophils Relative %: 78 %
Platelets: 331 10*3/uL (ref 150–400)
RBC: 4.29 MIL/uL (ref 3.87–5.11)
RDW: 13.4 % (ref 11.5–15.5)
WBC: 14.8 10*3/uL — ABNORMAL HIGH (ref 4.0–10.5)
nRBC: 0 % (ref 0.0–0.2)

## 2018-01-02 LAB — I-STAT BETA HCG BLOOD, ED (MC, WL, AP ONLY): I-stat hCG, quantitative: <5 m[IU]/mL (ref <5)

## 2018-01-02 LAB — I-STAT CG4 LACTIC ACID, ED: Lactic Acid, Venous: 0.81 mmol/L (ref 0.5–1.9)

## 2018-01-02 MED ORDER — VANCOMYCIN HCL IN DEXTROSE 1-5 GM/200ML-% IV SOLN: 1000.0000 mg | Freq: Once | INTRAVENOUS | Status: DC

## 2018-01-02 MED ORDER — CLINDAMYCIN PHOSPHATE 600 MG/50ML IV SOLN
600.0000 mg | Freq: Once | INTRAVENOUS | Status: AC
  Administered 2018-01-02: 600 mg via INTRAVENOUS
  Filled 2018-01-02: qty 50

## 2018-01-02 MED ORDER — MORPHINE SULFATE (PF) 4 MG/ML IV SOLN
4.0000 mg | Freq: Once | INTRAVENOUS | Status: AC
  Administered 2018-01-02: 4 mg via INTRAVENOUS
  Filled 2018-01-02: qty 1

## 2018-01-02 MED ORDER — VANCOMYCIN HCL 10 G IV SOLR
2500.0000 mg | Freq: Once | INTRAVENOUS | Status: AC
  Administered 2018-01-02: 2500 mg via INTRAVENOUS
  Filled 2018-01-02: qty 2000

## 2018-01-02 MED ORDER — SODIUM CHLORIDE 0.9 % IV SOLN
1.0000 g | Freq: Once | INTRAVENOUS | Status: AC
  Administered 2018-01-02: 1 g via INTRAVENOUS
  Filled 2018-01-02: qty 1

## 2018-01-02 NOTE — ED Triage Notes (Signed)
Patient reports abscess to right breast was drained on 12/23. States she was told to come back for wound check. Reports drainage and continued pain. Denies fevers.

## 2018-01-02 NOTE — H&P (Addendum)
History and Physical    Sherri Reid ZOX:[id number] DOB: 05/24/1975 DOA: 01/02/2018  PCP: Patient, No Pcp Per Patient coming from: Home  Chief Complaint: Abscess  HPI: Sherri Reid is a 42 y.o. female with medical history significant of asthma resenting to the hospital for evaluation of right breast abscess.  Patient was seen in the ED for a right breast abscess on December 28, 2017.  At that time, she had an I&D performed which showed purulent drainage.  She received a dose of Rocephin and was discharged home on Bactrim.   Patient states she started noticing redness and swelling on the lateral aspect of her right breast on December 21.  There was initially a bump in the area which she popped.  States she went to the emergency room on the 23rd and they cut the area open and it drained a lot of pus.  She was sent home on an antibiotic which she has been taking.  The area continues to drain pus and is very painful.  Reports having chills but no fevers.  Denies having any nausea or vomiting.  States her appetite is good.  ED Course: Afebrile and hemodynamically stable.  White count 14.8.  Lactic acid normal.  Chest x-ray without evidence of soft tissue air or radiopaque foreign body. ED physician did a curbside consult with general surgery who recommended obtaining a breast ultrasound for evaluation of possible abscess that might need surgical debridement. Patient received cefepime, vancomycin, and clindamycin in the ED.  Review of Systems: As per HPI otherwise 10 point review of systems negative.  Past Medical History:  Diagnosis Date  . Asthma   . Infection    UTI  . Syphilis    treated  . Vaginal Pap smear, abnormal    f/u ok    Past Surgical History:  Procedure Laterality Date  . NO PAST SURGERIES       reports that she has been smoking cigarettes. She has a 22.00 pack-year smoking history. She has never used smokeless tobacco. She reports current alcohol use. She reports  that she does not use drugs.  Allergies  Allergen Reactions  . Latex Swelling  . Penicillins Hives, Swelling and Other (See Comments)    Has patient had a PCN reaction causing immediate rash, facial/tongue/throat swelling, SOB or lightheadedness with hypotension: Yes Has patient had a PCN reaction causing severe rash involving mucus membranes or skin necrosis: No Has patient had a PCN reaction that required hospitalization: No Has patient had a PCN reaction occurring within the last 10 years: No If all of the above answers are "NO", then may proceed with Cephalosporin use.     Family History  Problem Relation Age of Onset  . Hyperlipidemia Mother   . Mental illness Mother   . Hypertension Mother   . Diabetes Father   . Cancer Maternal Aunt   . Cancer Paternal Uncle   . Anesthesia problems Neg Hx     Prior to Admission medications   Medication Sig Start Date End Date Taking? Authorizing Provider  HYDROcodone-acetaminophen (NORCO/VICODIN) 5-325 MG tablet Take 1 tablet by mouth every 4 (four) hours as needed. Patient taking differently: Take 1 tablet by mouth every 4 (four) hours as needed for moderate pain or severe pain.  12/28/17  Yes Cheron SchaumannSofia, Leslie K, PA-C  ibuprofen (ADVIL,MOTRIN) 200 MG tablet Take 600 mg by mouth every 6 (six) hours as needed for headache.   Yes [provider]  sulfamethoxazole-trimethoprim (BACTRIM DS,SEPTRA DS) 800-160 MG  tablet Take 1 tablet by mouth 2 (two) times daily for 7 days. 12/28/17 01/04/18 Yes Elson Areas, PA-C    Physical Exam: Vitals:   01/02/18 1952 01/02/18 2202 01/03/18 0031 01/03/18 0418  BP: 138/83 (!) 159/102 (!) 158/87 114/75  Pulse: 92 93 92 91  Resp: 18 18 16 18   Temp: 97.6 F (36.4 C)  97.7 F (36.5 C) 98.2 F (36.8 C)  TempSrc: Oral  Oral Oral  SpO2: 100% 100% 100% 100%  Weight:   107.2 kg   Height:   5\' 2"  (1.575 m)     Physical Exam  Constitutional: She is oriented to person, place, and time. She appears  well-developed and well-nourished. No distress.  HENT:  Head: Normocephalic.  Mouth/Throat: Oropharynx is clear and moist.  Eyes: Right eye exhibits no discharge. Left eye exhibits no discharge.  Neck: Neck supple.  Cardiovascular: Normal rate, regular rhythm and intact distal pulses.  Pulmonary/Chest: Effort normal and breath sounds normal. No respiratory distress. She has no wheezes. She has no rales.  Abdominal: Soft. Bowel sounds are normal. She exhibits no distension. There is no abdominal tenderness. There is no guarding.  Musculoskeletal:        General: No edema.  Neurological: She is alert and oriented to person, place, and time.  Skin: Skin is warm and dry. She is not diaphoretic.  Area of warmth, erythema, and induration noted on the lateral aspect of the right breast.  Several areas of necrosis in the central area. There are open wounds with purulent drainage.  Please see image.  Psychiatric: She has a normal mood and affect. Her behavior is normal.       Labs on Admission: I have personally reviewed following labs and imaging studies  CBC: Recent Labs  Lab 12/28/17 0739 01/02/18 2136 01/03/18 0624  WBC 9.0 14.8* 11.4*  NEUTROABS 6.1 11.7*  --   HGB 11.3* 11.1* 10.0*  HCT 35.7* 35.7* 31.8*  MCV 83.8 83.2 82.6  PLT 263 331 279   Basic Metabolic Panel: Recent Labs  Lab 12/28/17 0739 01/02/18 2136 01/03/18 0624  NA 138 139 139  K 3.4* 3.5 3.6  CL 106 108 111  CO2 24 24 23   GLUCOSE 103* 96 106*  BUN 9 14 11   CREATININE 0.79 1.21* 0.86  CALCIUM 8.3* 8.4* 7.7*   GFR: Estimated Creatinine Clearance: 98.1 mL/min (by C-G formula based on SCr of 0.86 mg/dL). Liver Function Tests: No results for input(s): AST, ALT, ALKPHOS, BILITOT, PROT, ALBUMIN in the last 168 hours. No results for input(s): LIPASE, AMYLASE in the last 168 hours. No results for input(s): AMMONIA in the last 168 hours. Coagulation Profile: No results for input(s): INR, PROTIME in the last  168 hours. Cardiac Enzymes: No results for input(s): CKTOTAL, CKMB, CKMBINDEX, TROPONINI in the last 168 hours. BNP (last 3 results) No results for input(s): PROBNP in the last 8760 hours. HbA1C: No results for input(s): HGBA1C in the last 72 hours. CBG: No results for input(s): GLUCAP in the last 168 hours. Lipid Profile: No results for input(s): CHOL, HDL, LDLCALC, TRIG, CHOLHDL, LDLDIRECT in the last 72 hours. Thyroid Function Tests: No results for input(s): TSH, T4TOTAL, FREET4, T3FREE, THYROIDAB in the last 72 hours. Anemia Panel: No results for input(s): VITAMINB12, FOLATE, FERRITIN, TIBC, IRON, RETICCTPCT in the last 72 hours. Urine analysis:    Component Value Date/Time   COLORURINE YELLOW 09/18/2017 1855   APPEARANCEUR CLEAR 09/18/2017 1855   LABSPEC 1.020 09/18/2017 1855  PHURINE 7.0 09/18/2017 1855   GLUCOSEU NEGATIVE 09/18/2017 1855   HGBUR NEGATIVE 09/18/2017 1855   BILIRUBINUR NEGATIVE 09/18/2017 1855   KETONESUR NEGATIVE 09/18/2017 1855   PROTEINUR NEGATIVE 09/18/2017 1855   UROBILINOGEN 0.2 03/09/2014 0910   NITRITE NEGATIVE 09/18/2017 1855   LEUKOCYTESUR NEGATIVE 09/18/2017 1855    Radiological Exams on Admission: Dg Chest 2 View  Result Date: 01/02/2018 CLINICAL DATA:  Status post drainage of right breast abscess. Persistent drainage and right chest wall pain. Assess for subcutaneous air. EXAM: CHEST - 2 VIEW COMPARISON:  Chest radiograph performed 09/18/2017 FINDINGS: No soft tissue air is seen at this time, though very small amounts of soft tissue air may not be visible on chest radiograph. The lungs are well-aerated and clear. There is no evidence of focal opacification, pleural effusion or pneumothorax. The heart is normal in size; the mediastinal contour is within normal limits. No acute osseous abnormalities are seen. No radiopaque foreign bodies are identified. IMPRESSION: 1. No soft tissue air seen at this time, though very small amounts of soft tissue  air may not be visible on chest radiograph. 2. No acute cardiopulmonary process seen. No radiopaque foreign bodies identified. Electronically Signed   By: Roanna RaiderJeffery  Chang M.D.   On: 01/02/2018 22:21    Assessment/Plan Principal Problem:   Cellulitis of breast Active Problems:   AKI (acute kidney injury) (HCC)   Chronic anemia  Cellulitis of right breast Afebrile and hemodynamically stable.  White count 14.8.  Lactic acid normal.  Chest x-ray without evidence of soft tissue air or radiopaque foreign body. ED physician did a curbside consult with general surgery who recommended obtaining a breast ultrasound for evaluation of possible abscess that might need surgical debridement. Received cefepime, vancomycin, and clindamycin in the ED. -Discontinue clindamycin.  Continue cefepime, vancomycin, and metronidazole. -Right breast ultrasound -Norco as needed for pain -Monitor CBC -Wound care consult  Mild AKI Serum creatinine 1.2, baseline 0.7-0.9.  Likely secondary to dehydration. -IV fluid hydration -Monitor BMP   Chronic anemia -Stable.  Hemoglobin 11.1, at baseline.  DVT prophylaxis: Lovenox Code Status: Full code Family Communication: Girlfriend at bedside. Disposition Plan: Anticipate discharge to home in 2 to 3 days. Consults called: None Admission status: It is my clinical opinion that admission to INPATIENT is reasonable and necessary in this 42 y.o. female . presenting with symptoms of right breast skin erythema, drainage, concerning for right breast cellulitis . with pertinent positives on physical exam including: Evidence of right breast cellulitis . and pertinent positives on radiographic and laboratory data including: Leukocytosis . Workup and treatment include IV antibiotics.   Given the aforementioned, the predictability of an adverse outcome is felt to be significant. I expect that the patient will require at least 2 midnights in the hospital to treat this  condition.    John GiovanniVasundhra Sibel Khurana MD [facility] Pager [phone]- [phone]  If 7PM-7AM, please contact night-coverage [email or web address] Password [facility] Center[facility]  01/03/2018, 7:31 AM

## 2018-01-02 NOTE — ED Provider Notes (Signed)
[facility]-EMERGENCY DEPT Provider Note   CSN: [id number] Arrival date & time: 01/02/18  1943     History   Chief Complaint Chief Complaint  Patient presents with  . Abscess    HPI Sherri Reid is a 42 y.o. female past history of asthma who presents for evaluation of wound check to right breast.  Patient reports that she was seen here on 12/28/17 for evaluation of abscess to breast.  At that time, she had an I&D performed which showed purulent drainage.  She received a dose of IV antibiotics here was discharged home on Bactrim.  Patient reports she is been compliant with the Bactrim medication.  Patient comes in today because she is continued to have pain to the area as well as drainage.  She reports that initially, the areas seem to be improving but reports that about a day after the I&D, her symptoms returned and worsened.  Patient reports that she has not had any fevers.  Patient reports she has no history of diabetes.  Patient had previously not had any issues with her breast prior to onset of current symptoms.  She denies any difficulty breathing, vomiting.  The history is provided by the patient.    Past Medical History:  Diagnosis Date  . Asthma   . Infection    UTI  . Syphilis    treated  . Vaginal Pap smear, abnormal    f/u ok    Patient Active Problem List   Diagnosis Date Noted  . Cellulitis of breast 01/02/2018    Past Surgical History:  Procedure Laterality Date  . NO PAST SURGERIES       OB History    Gravida  1   Para  0   Term  0   Preterm  0   AB  1   Living  0     SAB  1   TAB  0   Ectopic  0   Multiple  0   Live Births               Home Medications    Prior to Admission medications   Medication Sig Start Date End Date Taking? Authorizing Provider  HYDROcodone-acetaminophen (NORCO/VICODIN) 5-325 MG tablet Take 1 tablet by mouth every 4 (four) hours as needed. Patient taking differently: Take 1  tablet by mouth every 4 (four) hours as needed for moderate pain or severe pain.  12/28/17  Yes Cheron SchaumannSofia, Leslie K, PA-C  ibuprofen (ADVIL,MOTRIN) 200 MG tablet Take 600 mg by mouth every 6 (six) hours as needed for headache.   Yes [provider]  sulfamethoxazole-trimethoprim (BACTRIM DS,SEPTRA DS) 800-160 MG tablet Take 1 tablet by mouth 2 (two) times daily for 7 days. 12/28/17 01/04/18 Yes Elson AreasSofia, Leslie K, PA-C    Family History Family History  Problem Relation Age of Onset  . Hyperlipidemia Mother   . Mental illness Mother   . Hypertension Mother   . Diabetes Father   . Cancer Maternal Aunt   . Cancer Paternal Uncle   . Anesthesia problems Neg Hx     Social History Social History   Tobacco Use  . Smoking status: Current Every Day Smoker    Packs/day: 1.00    Years: 22.00    Pack years: 22.00    Types: Cigarettes  . Smokeless tobacco: Never Used  Substance Use Topics  . Alcohol use: Yes    Comment: Once a month   . Drug use: No  Allergies   Latex and Penicillins   Review of Systems Review of Systems  Constitutional: Negative for fever.  Respiratory: Negative for shortness of breath.   Gastrointestinal: Negative for vomiting.  Skin: Positive for color change and wound.  All other systems reviewed and are negative.    Physical Exam Updated Vital Signs BP (!) 159/102   Pulse 93   Temp 97.6 F (36.4 C) (Oral)   Resp 18   LMP 12/27/2017   SpO2 100%   Physical Exam Vitals signs and nursing note reviewed.  Constitutional:      Appearance: Normal appearance. She is well-developed.     Comments: Patient appears uncomfortable.  HENT:     Head: Normocephalic and atraumatic.  Eyes:     General: Lids are normal.     Conjunctiva/sclera: Conjunctivae normal.     Pupils: Pupils are equal, round, and reactive to light.  Neck:     Musculoskeletal: Full passive range of motion without pain.  Cardiovascular:     Rate and Rhythm: Normal rate and regular  rhythm.     Pulses: Normal pulses.     Heart sounds: Normal heart sounds. No murmur. No friction rub. No gallop.   Pulmonary:     Effort: Pulmonary effort is normal.     Breath sounds: Normal breath sounds.  Chest:     Comments: Tenderness palpation of the lateral aspect of the right breast.  No crepitus noted. Abdominal:     Palpations: Abdomen is soft. Abdomen is not rigid.     Tenderness: There is no abdominal tenderness. There is no guarding.  Musculoskeletal: Normal range of motion.  Skin:    General: Skin is warm and dry.     Capillary Refill: Capillary refill takes less than 2 seconds.     Comments: Diffuse warmth, erythema, induration noted to the lateral aspect of the right breast.  In the central area, there is several areas of necrosis, open wounds that are draining purulent drainage.  Neurological:     Mental Status: She is alert and oriented to person, place, and time.  Psychiatric:        Speech: Speech normal.          ED Treatments / Results  Labs (all labs ordered are listed, but only abnormal results are displayed) Labs Reviewed  BASIC METABOLIC PANEL - Abnormal; Notable for the following components:      Result Value   Creatinine, Ser 1.21 (*)    Calcium 8.4 (*)    GFR calc non Af Amer 55 (*)    All other components within normal limits  CBC WITH DIFFERENTIAL/PLATELET - Abnormal; Notable for the following components:   WBC 14.8 (*)    Hemoglobin 11.1 (*)    HCT 35.7 (*)    MCH 25.9 (*)    Neutro Abs 11.7 (*)    All other components within normal limits  AEROBIC CULTURE (SUPERFICIAL SPECIMEN)  I-STAT CG4 LACTIC ACID, ED  I-STAT BETA HCG BLOOD, ED ([facility], [facility], [facility] ONLY)  I-STAT CG4 LACTIC ACID, ED    EKG None  Radiology Dg Chest 2 View  Result Date: 01/02/2018 CLINICAL DATA:  Status post drainage of right breast abscess. Persistent drainage and right chest wall pain. Assess for subcutaneous air. EXAM: CHEST - 2 VIEW COMPARISON:  Chest radiograph  performed 09/18/2017 FINDINGS: No soft tissue air is seen at this time, though very small amounts of soft tissue air may not be visible on chest radiograph. The lungs  are well-aerated and clear. There is no evidence of focal opacification, pleural effusion or pneumothorax. The heart is normal in size; the mediastinal contour is within normal limits. No acute osseous abnormalities are seen. No radiopaque foreign bodies are identified. IMPRESSION: 1. No soft tissue air seen at this time, though very small amounts of soft tissue air may not be visible on chest radiograph. 2. No acute cardiopulmonary process seen. No radiopaque foreign bodies identified. Electronically Signed   By: Roanna Raider M.D.   On: 01/02/2018 22:21    Procedures Procedures (including critical care time)  Medications Ordered in ED Medications  vancomycin (VANCOCIN) 2,500 mg in sodium chloride 0.9 % 500 mL IVPB (2,500 mg Intravenous New Bag/Given 01/02/18 2307)  clindamycin (CLEOCIN) IVPB 600 mg (0 mg Intravenous Stopped 01/02/18 2240)  ceFEPIme (MAXIPIME) 1 g in sodium chloride 0.9 % 100 mL IVPB (1 g Intravenous New Bag/Given 01/02/18 2240)  morphine 4 MG/ML injection 4 mg (4 mg Intravenous Given 01/02/18 2204)     Initial Impression / Assessment and Plan / ED Course  I have reviewed the triage vital signs and the nursing notes.  Pertinent labs & imaging results that were available during my care of the patient were reviewed by me and considered in my medical decision making (see chart for details).     42 year old female who presents for evaluation of wound recheck.  Reports she had an I&D of her breast done on 12/28/17.  Discharged home on antibiotics.  Patient comes today because she is continued to have pain, redness, swelling of the breast as well as drainage.  She states she not had any fevers.  Patient is afebrile.  She appears uncomfortable but no acute distress.  Vital signs reviewed and stable.  On exam, she has a  diffuse area of warmth, erythema, induration with some central areas of necrosis and open wounds that are actively draining purulent drainage.  No crepitus noted.  Concerned that this may need surgical debridement.  Additionally, given that she has failed outpatient therapy, she may require admission for IV antibiotics.  We will plan to check basic labs.  CBC shows leukocytosis of 14.8.  Otherwise unremarkable.  BMP is unremarkable.  I-STAT lactic negative.  I-STAT beta negative.  Chest x-ray negative for any evidence of subcutaneous emphysema.  Curbside consult done by general surgery who agreed with plan for admission.  Recommends obtaining further image for evaluation of possible abscess that might need surgical debridement.  Given worsening symptoms, failure of outpatient metabolic therapy, patient needs admission for IV antibiotics.  Additionally, will likely need breast ultrasound for evaluation of any possible abscess that would require surgical I&D.  Discussed patient with hospitalist. Will admit.   Final Clinical Impressions(s) / ED Diagnoses   Final diagnoses:  Cellulitis of breast  Abscess    ED Discharge Orders    None       Rosana Hoes 01/02/18 2314    Little, Ambrose Finland, MD 01/07/18 [phone]

## 2018-01-02 NOTE — Progress Notes (Signed)
A consult was received from an ED physician for cefepime and vancomycin per pharmacy dosing.  The patient's profile has been reviewed for ht/wt/allergies/indication/available labs.   A one time order has been placed for Cefepime 1 gm and Vancomycin 2500 mg.  Further antibiotics/pharmacy consults should be ordered by admitting physician if indicated.                       Thank you, Lorenza EvangelistGreen, Desia Saban R 01/02/2018  10:01 PM

## 2018-01-03 ENCOUNTER — Other Ambulatory Visit: Payer: Self-pay

## 2018-01-03 ENCOUNTER — Inpatient Hospital Stay (HOSPITAL_COMMUNITY): Payer: PRIVATE HEALTH INSURANCE

## 2018-01-03 DIAGNOSIS — N61 Mastitis without abscess: Principal | ICD-10-CM

## 2018-01-03 DIAGNOSIS — N179 Acute kidney failure, unspecified: Secondary | ICD-10-CM

## 2018-01-03 DIAGNOSIS — D649 Anemia, unspecified: Secondary | ICD-10-CM

## 2018-01-03 DIAGNOSIS — D72829 Elevated white blood cell count, unspecified: Secondary | ICD-10-CM

## 2018-01-03 LAB — CBC
HCT: 31.8 % — ABNORMAL LOW (ref 36.0–46.0)
Hemoglobin: 10 g/dL — ABNORMAL LOW (ref 12.0–15.0)
MCH: 26 pg (ref 26.0–34.0)
MCHC: 31.4 g/dL (ref 30.0–36.0)
MCV: 82.6 fL (ref 80.0–100.0)
PLATELETS: 279 10*3/uL (ref 150–400)
RBC: 3.85 MIL/uL — ABNORMAL LOW (ref 3.87–5.11)
RDW: 13.5 % (ref 11.5–15.5)
WBC: 11.4 10*3/uL — ABNORMAL HIGH (ref 4.0–10.5)
nRBC: 0 % (ref 0.0–0.2)

## 2018-01-03 LAB — BASIC METABOLIC PANEL
Anion gap: 5 (ref 5–15)
BUN: 11 mg/dL (ref 6–20)
CO2: 23 mmol/L (ref 22–32)
Calcium: 7.7 mg/dL — ABNORMAL LOW (ref 8.9–10.3)
Chloride: 111 mmol/L (ref 98–111)
Creatinine, Ser: 0.86 mg/dL (ref 0.44–1.00)
GFR calc Af Amer: >60 mL/min (ref >60)
GFR calc non Af Amer: >60 mL/min (ref >60)
Glucose, Bld: 106 mg/dL — ABNORMAL HIGH (ref 70–99)
Potassium: 3.6 mmol/L (ref 3.5–5.1)
Sodium: 139 mmol/L (ref 135–145)

## 2018-01-03 LAB — MRSA PCR SCREENING: MRSA by PCR: NEGATIVE

## 2018-01-03 MED ORDER — NICOTINE 14 MG/24HR TD PT24
14.0000 mg | MEDICATED_PATCH | Freq: Every day | TRANSDERMAL | Status: DC
  Administered 2018-01-03 – 2018-01-04 (×2): 14 mg via TRANSDERMAL
  Filled 2018-01-03 (×2): qty 1

## 2018-01-03 MED ORDER — SODIUM CHLORIDE 0.9 % IV SOLN
INTRAVENOUS | Status: AC
  Administered 2018-01-03: 01:00:00 via INTRAVENOUS

## 2018-01-03 MED ORDER — ONDANSETRON HCL 4 MG/2ML IJ SOLN
4.0000 mg | Freq: Four times a day (QID) | INTRAMUSCULAR | Status: DC | PRN
  Administered 2018-01-03 – 2018-01-05 (×5): 4 mg via INTRAVENOUS
  Filled 2018-01-03 (×6): qty 2

## 2018-01-03 MED ORDER — VANCOMYCIN HCL IN DEXTROSE 1-5 GM/200ML-% IV SOLN
1000.0000 mg | Freq: Two times a day (BID) | INTRAVENOUS | Status: DC
  Administered 2018-01-03 – 2018-01-05 (×5): 1000 mg via INTRAVENOUS
  Filled 2018-01-03 (×4): qty 200

## 2018-01-03 MED ORDER — SODIUM CHLORIDE 0.9 % IV SOLN
2.0000 g | Freq: Three times a day (TID) | INTRAVENOUS | Status: DC
  Administered 2018-01-03 – 2018-01-05 (×8): 2 g via INTRAVENOUS
  Filled 2018-01-03 (×10): qty 2

## 2018-01-03 MED ORDER — HYDROCODONE-ACETAMINOPHEN 5-325 MG PO TABS
1.0000 | ORAL_TABLET | ORAL | Status: DC | PRN
  Administered 2018-01-03 – 2018-01-05 (×12): 2 via ORAL
  Filled 2018-01-03 (×12): qty 2

## 2018-01-03 MED ORDER — METRONIDAZOLE IN NACL 5-0.79 MG/ML-% IV SOLN
500.0000 mg | Freq: Three times a day (TID) | INTRAVENOUS | Status: DC
  Administered 2018-01-03 – 2018-01-05 (×8): 500 mg via INTRAVENOUS
  Filled 2018-01-03 (×8): qty 100

## 2018-01-03 MED ORDER — ENOXAPARIN SODIUM 40 MG/0.4ML ~~LOC~~ SOLN
40.0000 mg | Freq: Every day | SUBCUTANEOUS | Status: DC
  Administered 2018-01-03 – 2018-01-05 (×3): 40 mg via SUBCUTANEOUS
  Filled 2018-01-03 (×4): qty 0.4

## 2018-01-03 MED ORDER — SODIUM CHLORIDE 0.9 % IV SOLN: INTRAVENOUS | Status: AC

## 2018-01-03 MED ORDER — VANCOMYCIN HCL IN DEXTROSE 1-5 GM/200ML-% IV SOLN: 1000.0000 mg | INTRAVENOUS | Status: DC

## 2018-01-03 MED ORDER — SENNOSIDES-DOCUSATE SODIUM 8.6-50 MG PO TABS
1.0000 | ORAL_TABLET | Freq: Two times a day (BID) | ORAL | Status: DC
  Administered 2018-01-03 – 2018-01-05 (×5): 1 via ORAL
  Filled 2018-01-03 (×5): qty 1

## 2018-01-03 MED ORDER — METOCLOPRAMIDE HCL 5 MG/ML IJ SOLN
10.0000 mg | Freq: Once | INTRAMUSCULAR | Status: AC
  Administered 2018-01-03: 10 mg via INTRAVENOUS
  Filled 2018-01-03: qty 2

## 2018-01-03 MED ORDER — ACETAMINOPHEN 325 MG PO TABS
650.0000 mg | ORAL_TABLET | Freq: Four times a day (QID) | ORAL | Status: DC | PRN
  Administered 2018-01-03: 650 mg via ORAL
  Filled 2018-01-03: qty 2

## 2018-01-03 NOTE — ED Notes (Signed)
ED TO INPATIENT HANDOFF REPORT  Name/Age/Gender Sherri AlarSamantha Reid 42 y.o. female  Code Status   Home/SNF/Other Home  Chief Complaint Follow Up- Boil Removal   Level of Care/Admitting Diagnosis ED Disposition    ED Disposition Condition Comment   Admit  [facility] [[id number]]  Level of Care: Med-Surg [16]  Diagnosis: Cellulitis of breast [161096][[id number]]  Admitting Physician: John GiovanniRATHORE, VASUNDHRA [[id number]]  Attending Physician: John GiovanniATHORE, VASUNDHRA [[id number]]  Estimated length of stay: past midnight tomorrow  Certification:: I certify this patient will need inpatient services for at least 2 midnights  PT Class (Do Not Modify): Inpatient [101]  PT Acc Code (Do Not Modify): Private [1]       Medical History Past Medical History:  Diagnosis Date  . Asthma   . Infection    UTI  . Syphilis    treated  . Vaginal Pap smear, abnormal    f/u ok    Allergies Allergies  Allergen Reactions  . Latex Swelling  . Penicillins Hives, Swelling and Other (See Comments)    Has patient had a PCN reaction causing immediate rash, facial/tongue/throat swelling, SOB or lightheadedness with hypotension: Yes Has patient had a PCN reaction causing severe rash involving mucus membranes or skin necrosis: No Has patient had a PCN reaction that required hospitalization: No Has patient had a PCN reaction occurring within the last 10 years: No If all of the above answers are "NO", then may proceed with Cephalosporin use.     IV Location/Drains/Wounds Patient Lines/Drains/Airways Status   Active Line/Drains/Airways    Name:   Placement date:   Placement time:   Site:   Days:   Peripheral IV 01/02/18 Right Antecubital   01/02/18    2154    Antecubital   1          Labs/Imaging Results for orders placed or performed during the hospital encounter of 01/02/18 (from the past 48 hour(s))  Basic metabolic panel     Status: Abnormal   Collection Time: 01/02/18  9:36 PM   Result Value Ref Range   Sodium 139 135 - 145 mmol/L   Potassium 3.5 3.5 - 5.1 mmol/L   Chloride 108 98 - 111 mmol/L   CO2 24 22 - 32 mmol/L   Glucose, Bld 96 70 - 99 mg/dL   BUN 14 6 - 20 mg/dL   Creatinine, Ser 2.951.21 (H) 0.44 - 1.00 mg/dL   Calcium 8.4 (L) 8.9 - 10.3 mg/dL   GFR calc non Af Amer 55 (L) >60 mL/min   GFR calc Af Amer >60 >60 mL/min   Anion gap 7 5 - 15    Comment: Performed at [facility], [address] [place], [place] [address]  CBC with Differential     Status: Abnormal   Collection Time: 01/02/18  9:36 PM  Result Value Ref Range   WBC 14.8 (H) 4.0 - 10.5 K/uL   RBC 4.29 3.87 - 5.11 MIL/uL   Hemoglobin 11.1 (L) 12.0 - 15.0 g/dL   HCT 86.535.7 (L) 78.436.0 - 69.646.0 %   MCV 83.2 80.0 - 100.0 fL   MCH 25.9 (L) 26.0 - 34.0 pg   MCHC 31.1 30.0 - 36.0 g/dL   RDW 29.513.4 28.411.5 - 13.215.5 %   Platelets 331 150 - 400 K/uL   nRBC 0.0 0.0 - 0.2 %   Neutrophils Relative % 78 %   Neutro Abs 11.7 (H) 1.7 - 7.7 K/uL   Lymphocytes Relative 14 %  Lymphs Abs 2.0 0.7 - 4.0 K/uL   Monocytes Relative 6 %   Monocytes Absolute 0.8 0.1 - 1.0 K/uL   Eosinophils Relative 1 %   Eosinophils Absolute 0.1 0.0 - 0.5 K/uL   Basophils Relative 0 %   Basophils Absolute 0.0 0.0 - 0.1 K/uL   Immature Granulocytes 1 %   Abs Immature Granulocytes 0.07 0.00 - 0.07 K/uL    Comment: Performed at [facility], [address] [place], [place] [address]  I-Stat beta hCG blood, ED     Status: None   Collection Time: 01/02/18  9:59 PM  Result Value Ref Range   I-stat hCG, quantitative <5.0 <5 mIU/mL   Comment 3            Comment:   GEST. AGE      CONC.  (mIU/mL)   <=1 WEEK        5 - 50     2 WEEKS       50 - 500     3 WEEKS       100 - 10,000     4 WEEKS     1,000 - 30,000        FEMALE AND NON-PREGNANT FEMALE:     LESS THAN 5 mIU/mL   I-Stat CG4 Lactic Acid, ED     Status: None   Collection Time: 01/02/18 10:02 PM  Result Value Ref Range   Lactic Acid, Venous  0.81 0.5 - 1.9 mmol/L   Dg Chest 2 View  Result Date: 01/02/2018 CLINICAL DATA:  Status post drainage of right breast abscess. Persistent drainage and right chest wall pain. Assess for subcutaneous air. EXAM: CHEST - 2 VIEW COMPARISON:  Chest radiograph performed 09/18/2017 FINDINGS: No soft tissue air is seen at this time, though very small amounts of soft tissue air may not be visible on chest radiograph. The lungs are well-aerated and clear. There is no evidence of focal opacification, pleural effusion or pneumothorax. The heart is normal in size; the mediastinal contour is within normal limits. No acute osseous abnormalities are seen. No radiopaque foreign bodies are identified. IMPRESSION: 1. No soft tissue air seen at this time, though very small amounts of soft tissue air may not be visible on chest radiograph. 2. No acute cardiopulmonary process seen. No radiopaque foreign bodies identified. Electronically Signed   By: Roanna RaiderJeffery  Chang M.D.   On: 01/02/2018 22:21    Pending Labs Unresulted Labs (From admission, onward)    Start     Ordered   01/02/18 2136  Wound or Superficial Culture  Once,   STAT     01/02/18 2135          Vitals/Pain Today's Vitals   01/02/18 1952 01/02/18 1956 01/02/18 2202 01/02/18 2240  BP: 138/83  (!) 159/102   Pulse: 92  93   Resp: 18  18   Temp: 97.6 F (36.4 C)     TempSrc: Oral     SpO2: 100%  100%   PainSc:  9   7     Isolation Precautions No active isolations  Medications Medications  vancomycin (VANCOCIN) 2,500 mg in sodium chloride 0.9 % 500 mL IVPB (2,500 mg Intravenous New Bag/Given 01/02/18 2307)  clindamycin (CLEOCIN) IVPB 600 mg (0 mg Intravenous Stopped 01/02/18 2240)  ceFEPIme (MAXIPIME) 1 g in sodium chloride 0.9 % 100 mL IVPB (0 g Intravenous Stopped 01/02/18 2319)  morphine 4 MG/ML injection 4 mg (4 mg Intravenous Given 01/02/18  2204)    Mobility walks

## 2018-01-03 NOTE — Progress Notes (Signed)
Pharmacy Antibiotic Note  Sherri Reid is a 42 y.o. female admitted on 01/02/2018 with wound infection.  Pharmacy has been consulted for cefepime and vancomycin dosing.  Plan: Cefepime 1 gm x1 then 2 Gm IV q8h Vancomycin 2500 mg x1 then 1 Gm IV q24h for est AUC = 483 Goal AUC = 400-500 F/u scr/cultures/levels  Height: 5\' 2"  (157.5 cm) Weight: 236 lb 5.3 oz (107.2 kg) IBW/kg (Calculated) : 50.1  Temp (24hrs), Avg:97.7 F (36.5 C), Min:97.6 F (36.4 C), Max:97.7 F (36.5 C)  Recent Labs  Lab 12/28/17 0739 01/02/18 2136 01/02/18 2202  WBC 9.0 14.8*  --   CREATININE 0.79 1.21*  --   LATICACIDVEN  --   --  0.81    Estimated Creatinine Clearance: 69.7 mL/min (A) (by C-G formula based on SCr of 1.21 mg/dL (H)).    Allergies  Allergen Reactions  . Latex Swelling  . Penicillins Hives, Swelling and Other (See Comments)    Has patient had a PCN reaction causing immediate rash, facial/tongue/throat swelling, SOB or lightheadedness with hypotension: Yes Has patient had a PCN reaction causing severe rash involving mucus membranes or skin necrosis: No Has patient had a PCN reaction that required hospitalization: No Has patient had a PCN reaction occurring within the last 10 years: No If all of the above answers are "NO", then may proceed with Cephalosporin use.     Antimicrobials this admission: 12/28 cefepime >>  12/28 vancomycin >>   Dose adjustments this admission:   Microbiology results:  BCx:   UCx:    Sputum:     MRSA PCR:   Thank you for allowing pharmacy to be a part of this patient's care.  Lorenza EvangelistGreen, Ezzard Ditmer R 01/03/2018 12:36 AM

## 2018-01-03 NOTE — Progress Notes (Signed)
PROGRESS NOTE  Sherri AlarSamantha Reid RUE:[id number] DOB: 12/09/1975 DOA: 01/02/2018 PCP: Patient, No Pcp Per  HPI/Recap of past 24 hours:  Left pain in her breast, has chills , no fever Poor appetite, feeling nauseous ,no vomiting  Assessment/Plan: Principal Problem:   Cellulitis of breast Active Problems:   AKI (acute kidney injury) (HCC)   Chronic anemia  Cellulitis, right breast -S/p I/d on 12/23, on bactrim prior to hospitalization, but continued to have purulent drainage since I/D, she returned to the hospital and admitted -wound culture in process, blood culture obtained after first dose of abx, mrsa screening in process -continue abx, pain control, order wound care, leukocytosis improving -Breast us   AKI -cr 1.21 on presentation, cr 0.86 this am -monitor cr, renal dosing meds -continue hydration for another 24hr due to poor oral intake  Anemia of chronic disease hgb baseline around 11  H/o Asthma -stable, lung clear  Morbid obesity Body mass index is 43.23 kg/m.   Cigarette smoker Smoking cessation education nicotine patch   Code Status: full  Family Communication: patient   Disposition Plan: not ready to discharge   Consultants:  EDP curb side general surgery  Procedures:  Breast us  Antibiotics:  Vanc/cefepime/flagyl   Objective: BP 114/75 (BP Location: Left Arm)   Pulse 91   Temp 98.2 F (36.8 C) (Oral)   Resp 18   Ht 5\' 2"  (1.575 m)   Wt 107.2 kg   LMP 12/27/2017   SpO2 100%   BMI 43.23 kg/m   Intake/Output Summary (Last 24 hours) at 01/03/2018 [id number] Last data filed at 01/02/2018 2319 Gross per 24 hour  Intake 100 ml  Output -  Net 100 ml   Filed Weights   01/03/18 0031  Weight: 107.2 kg    Exam: Patient is examined daily including today on 01/03/2018, exams remain the same as of yesterday except that has changed    General:  NAD, right breast wound with fresh dressing , no odor, currently no active drainage    Cardiovascular: RRR  Respiratory: CTABL  Abdomen: Soft/ND/NT, positive BS  Musculoskeletal: No Edema  Neuro: alert, oriented   Data Reviewed: Basic Metabolic Panel: Recent Labs  Lab 12/28/17 0739 01/02/18 2136 01/03/18 0624  NA 138 139 139  K 3.4* 3.5 3.6  CL 106 108 111  CO2 24 24 23   GLUCOSE 103* 96 106*  BUN 9 14 11   CREATININE 0.79 1.21* 0.86  CALCIUM 8.3* 8.4* 7.7*   Liver Function Tests: No results for input(s): AST, ALT, ALKPHOS, BILITOT, PROT, ALBUMIN in the last 168 hours. No results for input(s): LIPASE, AMYLASE in the last 168 hours. No results for input(s): AMMONIA in the last 168 hours. CBC: Recent Labs  Lab 12/28/17 0739 01/02/18 2136 01/03/18 0624  WBC 9.0 14.8* 11.4*  NEUTROABS 6.1 11.7*  --   HGB 11.3* 11.1* 10.0*  HCT 35.7* 35.7* 31.8*  MCV 83.8 83.2 82.6  PLT 263 331 279   Cardiac Enzymes:   No results for input(s): CKTOTAL, CKMB, CKMBINDEX, TROPONINI in the last 168 hours. BNP (last 3 results) No results for input(s): BNP in the last 8760 hours.  ProBNP (last 3 results) No results for input(s): PROBNP in the last 8760 hours.  CBG: No results for input(s): GLUCAP in the last 168 hours.  Recent Results (from the past 240 hour(s))  Wound or Superficial Culture     Status: None (Preliminary result)   Collection Time: 01/02/18  9:36 PM  Result Value Ref  Range Status   Specimen Description ABSCESS  Final   Special Requests   Final    NONE Performed at [facility], [address] [place], [place] [address]    Gram Stain NO WBC SEEN NO ORGANISMS SEEN   Final   Culture PENDING  Incomplete   Report Status PENDING  Incomplete     Studies: Dg Chest 2 View  Result Date: 01/02/2018 CLINICAL DATA:  Status post drainage of right breast abscess. Persistent drainage and right chest wall pain. Assess for subcutaneous air. EXAM: CHEST - 2 VIEW COMPARISON:  Chest radiograph performed 09/18/2017 FINDINGS: No soft tissue  air is seen at this time, though very small amounts of soft tissue air may not be visible on chest radiograph. The lungs are well-aerated and clear. There is no evidence of focal opacification, pleural effusion or pneumothorax. The heart is normal in size; the mediastinal contour is within normal limits. No acute osseous abnormalities are seen. No radiopaque foreign bodies are identified. IMPRESSION: 1. No soft tissue air seen at this time, though very small amounts of soft tissue air may not be visible on chest radiograph. 2. No acute cardiopulmonary process seen. No radiopaque foreign bodies identified. Electronically Signed   By: Roanna RaiderJeffery  Chang M.D.   On: 01/02/2018 22:21    Scheduled Meds: . enoxaparin (LOVENOX) injection  40 mg Subcutaneous Daily    Continuous Infusions: . sodium chloride 150 mL/hr at 01/03/18 0100  . ceFEPime (MAXIPIME) IV 2 g (01/03/18 0641)  . metronidazole 500 mg (01/03/18 0156)  . vancomycin       Time spent: 35mins I have personally reviewed and interpreted on  01/03/2018 daily labs, imagings as discussed above under date review session and assessment and plans.  I reviewed all nursing notes, pharmacy notes,  vitals, pertinent old records  I have discussed plan of care as described above with RN , patient  on 01/03/2018   Albertine GratesFang Chellie Vanlue MD, PhD  [facility] Pager [phone]. If 7PM-7AM, please contact night-coverage at [email or web address], password [email or web address] 01/03/2018, 8:12 AM  LOS: 1 day

## 2018-01-03 NOTE — Progress Notes (Signed)
Pharmacy Antibiotic Note  Sherri AlarSamantha Reid is a 42 y.o. female admitted on 01/02/2018 with right breast abscess with continued purulent drainage and pain despite I&D on 12/23 and outpatient Bactrim.  Pharmacy has been consulted for cefepime and vancomycin dosing.  Metronidazole per MD.  SCr improved to 0.86 WBC elevated but improved to 11.4 AFebrile  Plan: Continue metronidazole 500mg  IV q8h. Continue Cefepime 2g IV q8h Increase to Vancomycin 1g IV q12h. Check vancomycin levels if remains on vancomycin > 3-4 days.  Goal AUC 400-500. Follow up renal fxn, culture results, and clinical course. F/u ability to de-escalate antibiotics.   Height: 5\' 2"  (157.5 cm) Weight: 236 lb 5.3 oz (107.2 kg) IBW/kg (Calculated) : 50.1  Temp (24hrs), Avg:97.8 F (36.6 C), Min:97.6 F (36.4 C), Max:98.2 F (36.8 C)  Recent Labs  Lab 12/28/17 0739 01/02/18 2136 01/02/18 2202 01/03/18 0624  WBC 9.0 14.8*  --  11.4*  CREATININE 0.79 1.21*  --  0.86  LATICACIDVEN  --   --  0.81  --     Estimated Creatinine Clearance: 98.1 mL/min (by C-G formula based on SCr of 0.86 mg/dL).    Allergies  Allergen Reactions  . Latex Swelling  . Penicillins Hives, Swelling and Other (See Comments)    Has patient had a PCN reaction causing immediate rash, facial/tongue/throat swelling, SOB or lightheadedness with hypotension: Yes Has patient had a PCN reaction causing severe rash involving mucus membranes or skin necrosis: No Has patient had a PCN reaction that required hospitalization: No Has patient had a PCN reaction occurring within the last 10 years: No If all of the above answers are "NO", then may proceed with Cephalosporin use.     Antimicrobials this admission: 12/28 Clindamycin x1  12/28 cefepime >>  12/28 vancomycin >>  12/29 Metronidazole >>   Dose adjustments this admission: 12/29 SCr improved to 0.86, Vanc dose adjusted.  Microbiology results: 12/29 BCx: sent 12/29 Wound, abscess: no org  on gram stain; cxt pending  12/29 MRSA PCR: sent  Thank you for allowing pharmacy to be a part of this patient's care.  Lynann Beaverhristine Maahi Lannan PharmD, BCPS Pager [phone] 01/03/2018 10:41 AM

## 2018-01-04 ENCOUNTER — Encounter (HOSPITAL_COMMUNITY): Payer: Self-pay | Admitting: *Deleted

## 2018-01-04 DIAGNOSIS — R112 Nausea with vomiting, unspecified: Secondary | ICD-10-CM

## 2018-01-04 DIAGNOSIS — R1110 Vomiting, unspecified: Secondary | ICD-10-CM

## 2018-01-04 LAB — CBC WITH DIFFERENTIAL/PLATELET
Abs Immature Granulocytes: 0.03 10*3/uL (ref 0.00–0.07)
Basophils Absolute: 0 10*3/uL (ref 0.0–0.1)
Basophils Relative: 0 %
EOS ABS: 0.1 10*3/uL (ref 0.0–0.5)
Eosinophils Relative: 1 %
HCT: 31.8 % — ABNORMAL LOW (ref 36.0–46.0)
Hemoglobin: 9.6 g/dL — ABNORMAL LOW (ref 12.0–15.0)
Immature Granulocytes: 0 %
Lymphocytes Relative: 18 %
Lymphs Abs: 1.4 10*3/uL (ref 0.7–4.0)
MCH: 26.1 pg (ref 26.0–34.0)
MCHC: 30.2 g/dL (ref 30.0–36.0)
MCV: 86.4 fL (ref 80.0–100.0)
Monocytes Absolute: 0.5 10*3/uL (ref 0.1–1.0)
Monocytes Relative: 7 %
Neutro Abs: 5.8 10*3/uL (ref 1.7–7.7)
Neutrophils Relative %: 74 %
Platelets: 265 10*3/uL (ref 150–400)
RBC: 3.68 MIL/uL — AB (ref 3.87–5.11)
RDW: 13.7 % (ref 11.5–15.5)
WBC: 8 10*3/uL (ref 4.0–10.5)
nRBC: 0 % (ref 0.0–0.2)

## 2018-01-04 LAB — BASIC METABOLIC PANEL
Anion gap: 9 (ref 5–15)
BUN: 7 mg/dL (ref 6–20)
CALCIUM: 7.7 mg/dL — AB (ref 8.9–10.3)
CO2: 21 mmol/L — ABNORMAL LOW (ref 22–32)
Chloride: 107 mmol/L (ref 98–111)
Creatinine, Ser: 0.76 mg/dL (ref 0.44–1.00)
GFR calc Af Amer: >60 mL/min (ref >60)
GFR calc non Af Amer: >60 mL/min (ref >60)
Glucose, Bld: 94 mg/dL (ref 70–99)
Potassium: 3.9 mmol/L (ref 3.5–5.1)
SODIUM: 137 mmol/L (ref 135–145)

## 2018-01-04 LAB — MAGNESIUM: Magnesium: 2.1 mg/dL (ref 1.7–2.4)

## 2018-01-04 LAB — HIV ANTIBODY (ROUTINE TESTING W REFLEX): HIV Screen 4th Generation wRfx: NONREACTIVE

## 2018-01-04 MED ORDER — NICOTINE 7 MG/24HR TD PT24
7.0000 mg | MEDICATED_PATCH | Freq: Every day | TRANSDERMAL | Status: DC
  Administered 2018-01-05: 7 mg via TRANSDERMAL
  Filled 2018-01-04: qty 1

## 2018-01-04 MED ORDER — BUTALBITAL-APAP-CAFFEINE 50-325-40 MG PO TABS
1.0000 | ORAL_TABLET | Freq: Once | ORAL | Status: AC
  Administered 2018-01-04: 1 via ORAL
  Filled 2018-01-04: qty 1

## 2018-01-04 NOTE — Consult Note (Signed)
WOC Nurse wound consult note Reason for Consult:right breast lesion Wound type:infectious Pressure Injury POA: N/A Measurement: 9cm x 11cm x 0.1cm Wound bed: red with yellow areas of early slough formation secondary to inflammatory process Drainage (amount, consistency, odor) moderate amounts serous exudate requiring several dressing changes per day over weekend. Periwound: intact, warm with mild induration and erythema Dressing procedure/placement/frequency: I will add a topical dressing that will donate both its astringent and antimicrobial properties, xeroform gauze.  Additionally, it will be nonadherent, where other absorptive dressings would not be. We will initiate at twice daily and add PRN dressing changes for drainage strike-through onto exterior dressing. Patient should follow up with PCP post acute admission.  WOC nursing team will not follow, but will remain available to this patient, the nursing and medical teams.  Please re-consult if needed. Thanks, Ladona MowLaurie Lecretia Buczek, MSN, RN, GNP, Hans EdenCWOCN, CWON-AP, FAAN  Pager# [phone]

## 2018-01-04 NOTE — Progress Notes (Signed)
PROGRESS NOTE  Sherri AlarSamantha White AVW:[id number] DOB: 11/09/1975 DOA: 01/02/2018 PCP: Patient, No Pcp Per  HPI/Recap of past 24 hours:  pain in her right breast,  , no fever, leukocytosis resolved  She reports vomiting, denies ab pain, reports regular bowel movement She wonder if she is having reaction to nicotine patch  Assessment/Plan: Principal Problem:   Cellulitis of breast Active Problems:   AKI (acute kidney injury) (HCC)   Chronic anemia   Obesity, Class III, BMI 40-49.9 (morbid obesity) (HCC)   Leukocytosis  Cellulitis, right breast -S/p I/d on 12/23, on bactrim prior to hospitalization, but continued to have purulent drainage since I/D, she returned to the hospital and admitted -wound culture in process, blood culture obtained after first dose of abx, no growth, mrsa screening negative -breast us no deep infection, no abscess  -continue abx, pain control, wound care, leukocytosis normalized -case discussed with infectious disease who agreed with current abx regimen, agree to discharge on bactrim for  Total of 10 days if continue to improve, patient need to follow up with pcp to have mamogram and biopsy if wound does not heal.   AKI -cr 1.21 on presentation, cr 0.86 this am -monitor cr, renal dosing meds -continue hydration for another 24hr due to poor oral intake  Anemia of chronic disease hgb baseline around 11  H/o Asthma -stable, lung clear  Morbid obesity Body mass index is 43.23 kg/m.   Cigarette smoker Smoking cessation education nicotine patch   Code Status: full  Family Communication: patient , mother over the phone  Disposition Plan: home in am if continue to improve   Consultants:  EDP curb side general surgery  Case discussed with infectious disease Dr Ninetta LightsHatcher over the phone on 12/30  Procedures:  Breast us  Antibiotics:  Vanc/cefepime/flagyl   Objective: BP 128/77 (BP Location: Left Arm)   Pulse 87   Temp 98.2 F (36.8  C) (Oral)   Resp 18   Ht 5\' 2"  (1.575 m)   Wt 107.2 kg   LMP 12/27/2017   SpO2 100%   BMI 43.23 kg/m   Intake/Output Summary (Last 24 hours) at 01/04/2018 1721 Last data filed at 01/04/2018 0730 Gross per 24 hour  Intake 1225.16 ml  Output 400 ml  Net 825.16 ml   Filed Weights   01/03/18 0031  Weight: 107.2 kg    Exam: Patient is examined daily including today on 01/04/2018, exams remain the same as of yesterday except that has changed    General:  NAD, right breast wound with fresh dressing , no odor, currently no active drainage   Cardiovascular: RRR  Respiratory: CTABL  Abdomen: Soft/ND/NT, positive BS  Musculoskeletal: No Edema  Neuro: alert, oriented   Data Reviewed: Basic Metabolic Panel: Recent Labs  Lab 01/02/18 2136 01/03/18 0624 01/04/18 0503  NA 139 139 137  K 3.5 3.6 3.9  CL 108 111 107  CO2 24 23 21*  GLUCOSE 96 106* 94  BUN 14 11 7   CREATININE 1.21* 0.86 0.76  CALCIUM 8.4* 7.7* 7.7*  MG  --   --  2.1   Liver Function Tests: No results for input(s): AST, ALT, ALKPHOS, BILITOT, PROT, ALBUMIN in the last 168 hours. No results for input(s): LIPASE, AMYLASE in the last 168 hours. No results for input(s): AMMONIA in the last 168 hours. CBC: Recent Labs  Lab 01/02/18 2136 01/03/18 0624 01/04/18 0503  WBC 14.8* 11.4* 8.0  NEUTROABS 11.7*  --  5.8  HGB 11.1* 10.0* 9.6*  HCT 35.7* 31.8* 31.8*  MCV 83.2 82.6 86.4  PLT 331 279 265   Cardiac Enzymes:   No results for input(s): CKTOTAL, CKMB, CKMBINDEX, TROPONINI in the last 168 hours. BNP (last 3 results) No results for input(s): BNP in the last 8760 hours.  ProBNP (last 3 results) No results for input(s): PROBNP in the last 8760 hours.  CBG: No results for input(s): GLUCAP in the last 168 hours.  Recent Results (from the past 240 hour(s))  Wound or Superficial Culture     Status: None (Preliminary result)   Collection Time: 01/02/18  9:36 PM  Result Value Ref Range Status    Specimen Description   Final    ABSCESS Performed at [facility], [address] [place], [place] [address]    Special Requests   Final    NONE Performed at [facility], [address] [place], [place] [address]    Gram Stain NO WBC SEEN NO ORGANISMS SEEN   Final   Culture   Final    CULTURE REINCUBATED FOR BETTER GROWTH Performed at [facility], [address] [place], [place] [address]    Report Status PENDING  Incomplete  MRSA PCR Screening     Status: None   Collection Time: 01/03/18  8:26 AM  Result Value Ref Range Status   MRSA by PCR NEGATIVE NEGATIVE Final    Comment:        The GeneXpert MRSA Assay (FDA approved for NASAL specimens only), is one component of a comprehensive MRSA colonization surveillance program. It is not intended to diagnose MRSA infection nor to guide or monitor treatment for MRSA infections. Performed at [facility], [address] [place], [place] [address]   Culture, blood (routine x 2)     Status: None (Preliminary result)   Collection Time: 01/03/18  8:34 AM  Result Value Ref Range Status   Specimen Description   Final    BLOOD RIGHT WRIST Performed at [facility], [address] [place], [place] [address]    Special Requests   Final    BOTTLES DRAWN AEROBIC AND ANAEROBIC Blood Culture results may not be optimal due to an excessive volume of blood received in culture bottles Performed at [facility], [address] [place], [place] [address]    Culture   Final    NO GROWTH < 24 HOURS Performed at [facility], [address] [place], [place] [address]    Report Status PENDING  Incomplete  Culture, blood (routine x 2)     Status: None (Preliminary result)   Collection Time: 01/03/18  8:39 AM  Result Value Ref Range Status   Specimen Description   Final    BLOOD RIGHT HAND Performed at [facility], [address] [place], [place] [address]    Special Requests   Final    BOTTLES DRAWN AEROBIC ONLY Blood Culture adequate volume Performed at [facility], [address] [place], [place] [address]    Culture   Final    NO GROWTH < 24 HOURS Performed at [facility], [address] [place], [place] [address]    Report Status PENDING  Incomplete     Studies: No results found.  Scheduled Meds: . enoxaparin (LOVENOX) injection  40 mg Subcutaneous Daily  . [START ON 01/05/2018] nicotine  7 mg Transdermal Daily  . senna-docusate  1 tablet Oral  BID    Continuous Infusions: . ceFEPime (MAXIPIME) IV 2 g (01/04/18 1528)  . metronidazole 500 mg (01/04/18 1715)  . vancomycin 1,000 mg (01/04/18 1323)     Time spent: I have personally reviewed and interpreted on  01/04/2018 daily labs, imagings as discussed above under date review session and assessment and plans.  I reviewed all nursing notes, pharmacy notes,  vitals, pertinent old records  I have discussed plan of care as described above with RN , patient  on 01/04/2018   Albertine Grates MD, PhD  [facility] Pager [phone]. If 7PM-7AM, please contact night-coverage at [email or web address], password [facility] 01/04/2018, 5:21 PM  LOS: 2 days

## 2018-01-05 LAB — AEROBIC CULTURE W GRAM STAIN (SUPERFICIAL SPECIMEN): Culture: NORMAL

## 2018-01-05 LAB — BASIC METABOLIC PANEL
Anion gap: 7 (ref 5–15)
BUN: 6 mg/dL (ref 6–20)
CO2: 24 mmol/L (ref 22–32)
Calcium: 8.2 mg/dL — ABNORMAL LOW (ref 8.9–10.3)
Chloride: 107 mmol/L (ref 98–111)
Creatinine, Ser: 0.73 mg/dL (ref 0.44–1.00)
GFR calc non Af Amer: >60 mL/min (ref >60)
Glucose, Bld: 90 mg/dL (ref 70–99)
Potassium: 3.7 mmol/L (ref 3.5–5.1)
Sodium: 138 mmol/L (ref 135–145)

## 2018-01-05 LAB — CBC WITH DIFFERENTIAL/PLATELET
Abs Immature Granulocytes: 0.01 10*3/uL (ref 0.00–0.07)
Basophils Absolute: 0 10*3/uL (ref 0.0–0.1)
Basophils Relative: 0 %
Eosinophils Absolute: 0.1 10*3/uL (ref 0.0–0.5)
Eosinophils Relative: 1 %
HEMATOCRIT: 32.4 % — AB (ref 36.0–46.0)
Hemoglobin: 10.1 g/dL — ABNORMAL LOW (ref 12.0–15.0)
Immature Granulocytes: 0 %
Lymphocytes Relative: 26 %
Lymphs Abs: 2 10*3/uL (ref 0.7–4.0)
MCH: 26.3 pg (ref 26.0–34.0)
MCHC: 31.2 g/dL (ref 30.0–36.0)
MCV: 84.4 fL (ref 80.0–100.0)
Monocytes Absolute: 0.7 10*3/uL (ref 0.1–1.0)
Monocytes Relative: 9 %
Neutro Abs: 5.1 10*3/uL (ref 1.7–7.7)
Neutrophils Relative %: 64 %
Platelets: 310 10*3/uL (ref 150–400)
RBC: 3.84 MIL/uL — ABNORMAL LOW (ref 3.87–5.11)
RDW: 13.7 % (ref 11.5–15.5)
WBC: 7.9 10*3/uL (ref 4.0–10.5)
nRBC: 0 % (ref 0.0–0.2)

## 2018-01-05 LAB — AEROBIC CULTURE  (SUPERFICIAL SPECIMEN): GRAM STAIN: NONE SEEN

## 2018-01-05 MED ORDER — HYDROCODONE-ACETAMINOPHEN 5-325 MG PO TABS: 1.0000 | ORAL_TABLET | ORAL | 0 refills | Status: DC | PRN

## 2018-01-05 MED ORDER — SULFAMETHOXAZOLE-TRIMETHOPRIM 800-160 MG PO TABS: 1.0000 | ORAL_TABLET | Freq: Two times a day (BID) | ORAL | 0 refills | Status: AC

## 2018-01-05 NOTE — Progress Notes (Signed)
Pt. discharged to home, left via wheelchair, no Shortness of Breath noted.

## 2018-01-05 NOTE — Discharge Summary (Signed)
Discharge Summary  Sherri Reid UJW:[id number] DOB: 1975/07/13  PCP: Patient, No Pcp Per  Admit date: 01/02/2018 Discharge date: 01/05/2018  Time spent: .  Recommendations for Outpatient Follow-up:  1. F/u with PCP within a week  for hospital discharge follow up, repeat cbc/bmp at follow up.  2. F/u with wound care center  Discharge Diagnoses:  Active Hospital Problems   Diagnosis Date Noted  . Cellulitis of breast 01/02/2018  . Non-intractable vomiting   . AKI (acute kidney injury) (HCC) 01/03/2018  . Chronic anemia 01/03/2018  . Obesity, Class III, BMI 40-49.9 (morbid obesity) (HCC)   . Leukocytosis     Resolved Hospital Problems  No resolved problems to display.    Discharge Condition: stable  Diet recommendation: regular diet  Filed Weights   01/03/18 0031  Weight: 107.2 kg    History of present illness: (per admitting MD Dr Loney Loh) PCP: Patient, No Pcp Per Patient coming from: Home  Chief Complaint: Abscess  HPI: Sherri Reid is a 42 y.o. female with medical history significant of asthma resenting to the hospital for evaluation of right breast abscess.  Patient was seen in the ED for a right breast abscess on December 28, 2017.  At that time, she had an I&D performed which showed purulent drainage.  She received a dose of Rocephin and was discharged home on Bactrim.   Patient states she started noticing redness and swelling on the lateral aspect of her right breast on December 21.  There was initially a bump in the area which she popped.  States she went to the emergency room on the 23rd and they cut the area open and it drained a lot of pus.  She was sent home on an antibiotic which she has been taking.  The area continues to drain pus and is very painful.  Reports having chills but no fevers.  Denies having any nausea or vomiting.  States her appetite is good.  ED Course: Afebrile and hemodynamically stable.  White count 14.8.  Lactic acid  normal.  Chest x-ray without evidence of soft tissue air or radiopaque foreign body. ED physician did a curbside consult with general surgery who recommended obtaining a breast ultrasound for evaluation of possible abscess that might need surgical debridement. Patient received cefepime, vancomycin, and clindamycin in the ED.   Hospital Course:  Principal Problem:   Cellulitis of breast Active Problems:   AKI (acute kidney injury) (HCC)   Chronic anemia   Obesity, Class III, BMI 40-49.9 (morbid obesity) (HCC)   Leukocytosis   Non-intractable vomiting   Cellulitis, right breast -S/p I/d on 12/23, on bactrim prior to hospitalization, but continued to have purulent drainage since I/D, she returned to the hospital and admitted -wound culture with normal skin flora, blood culture obtained after first dose of abx, no growth, mrsa screening negative -breast US no deep infection, no abscess  -she received vanc/cefepime/falgyl in the hospital, wound care, she is improving, leukocytosis normalized -case discussed with infectious disease who agreed with current abx regimen, agree to discharge on bactrim for  Total of 10, patient need to follow up with pcp to have mamogram and biopsy if wound does not heal. -wound care center info also provided to the patient.  Wound care RN input appreciated Per wound care RN "Measurement: 9cm x 11cm x 0.1cm Wound bed: red with yellow areas of early slough formation secondary to inflammatory process Drainage (amount, consistency, odor) moderate amounts serous exudate requiring several dressing changes per day over  weekend. Periwound: intact, warm with mild induration and erythema Dressing procedure/placement/frequency: I will add a topical dressing that will donate both its astringent and antimicrobial properties, xeroform gauze.  Additionally, it will be nonadherent, where other absorptive dressings would not be. We will initiate at twice daily and add PRN  dressing changes for drainage strike-through onto exterior dressing. Patient should follow up with PCP post acute admission."   AKI -cr 1.21 on presentation, Cr normalized with hydration,  cr 0.73  this am   Anemia of chronic disease hgb baseline around 10- 11 stable  H/o Asthma -stable, lung clear  Morbid obesity Body mass index is 43.23 kg/m.   Cigarette smoker Smoking cessation education nicotine patch   Code Status: full  Family Communication: patient , mother at bedside  Disposition Plan: home    Consultants:  EDP curb side general surgery  Case discussed with infectious disease Dr Ninetta LightsHatcher over the phone on 12/30  Wound care  Procedures:  Breast us  Antibiotics:  Vanc/cefepime/flagyl   Discharge Exam: BP 140/87 (BP Location: Left Arm)   Pulse 78   Temp 98.1 F (36.7 C) (Oral)   Resp 18   Ht 5\' 2"  (1.575 m)   Wt 107.2 kg   LMP 12/27/2017   SpO2 99%   BMI 43.23 kg/m   General: NAD, obese Cardiovascular: RRR Respiratory: CTABL Right breast cellulitis is improving  Discharge Instructions You were cared for by a hospitalist during your hospital stay. If you have any questions about your discharge medications or the care you received while you were in the hospital after you are discharged, you can call the unit and asked to speak with the hospitalist on call if the hospitalist that took care of you is not available. Once you are discharged, your primary care physician will handle any further medical issues. Please note that NO REFILLS for any discharge medications will be authorized once you are discharged, as it is imperative that you return to your primary care physician (or establish a relationship with a primary care physician if you do not have one) for your aftercare needs so that they can reassess your need for medications and monitor your lab values.  Discharge Instructions    Diet - low sodium heart healthy   Complete by:   As directed    Increase activity slowly   Complete by:  As directed      Allergies as of 01/05/2018      Reactions   Latex Swelling   Penicillins Hives, Swelling, Other (See Comments)   Has patient had a PCN reaction causing immediate rash, facial/tongue/throat swelling, SOB or lightheadedness with hypotension: Yes Has patient had a PCN reaction causing severe rash involving mucus membranes or skin necrosis: No Has patient had a PCN reaction that required hospitalization: No Has patient had a PCN reaction occurring within the last 10 years: No If all of the above answers are "NO", then may proceed with Cephalosporin use.      Medication List    TAKE these medications   HYDROcodone-acetaminophen 5-325 MG tablet Commonly known as:  NORCO/VICODIN Take 1 tablet by mouth every 4 (four) hours as needed. What changed:  reasons to take this   ibuprofen 200 MG tablet Commonly known as:  ADVIL,MOTRIN Take 600 mg by mouth every 6 (six) hours as needed for headache.   sulfamethoxazole-trimethoprim 800-160 MG tablet Commonly known as:  BACTRIM DS,SEPTRA DS Take 1 tablet by mouth 2 (two) times daily for 7 days.  Allergies  Allergen Reactions  . Latex Swelling  . Penicillins Hives, Swelling and Other (See Comments)    Has patient had a PCN reaction causing immediate rash, facial/tongue/throat swelling, SOB or lightheadedness with hypotension: Yes Has patient had a PCN reaction causing severe rash involving mucus membranes or skin necrosis: No Has patient had a PCN reaction that required hospitalization: No Has patient had a PCN reaction occurring within the last 10 years: No If all of the above answers are "NO", then may proceed with Cephalosporin use.    Follow-up Information    establish care wiht pcp Follow up in 1 week(s).   Why:  for hospital discharge follow up, repeat cbc/bmp at follow up, pcp to refer to memogram, pcp to refer to breast biopsy if wound does not heal.         [facility] Follow up.   Contact information: [address] [place] [address] [phone]       [facility]              .   Contact information: [address] [place] [address] [phone]           The results of significant diagnostics from this hospitalization (including imaging, microbiology, ancillary and laboratory) are listed below for reference.    Significant Diagnostic Studies: Dg Chest 2 View  Result Date: 01/02/2018 CLINICAL DATA:  Status post drainage of right breast abscess. Persistent drainage and right chest wall pain. Assess for subcutaneous air. EXAM: CHEST - 2 VIEW COMPARISON:  Chest radiograph performed 09/18/2017 FINDINGS: No soft tissue air is seen at this time, though very small amounts of soft tissue air may not be visible on chest radiograph. The lungs are well-aerated and clear. There is no evidence of focal opacification, pleural effusion or pneumothorax. The heart is normal in size; the mediastinal contour is within normal limits. No acute osseous abnormalities are seen. No radiopaque foreign bodies are identified. IMPRESSION: 1. No soft tissue air seen at this time, though very small amounts of soft tissue air may not be visible on chest radiograph. 2. No acute cardiopulmonary process seen. No radiopaque foreign bodies identified. Electronically Signed   By: Roanna Raider M.D.   On: 01/02/2018 22:21   US Breast Ltd Uni Right Inc Axilla  Result Date: 01/04/2018 CLINICAL DATA:  42 year old female with a history of cellulitis EXAM: ULTRASOUND RIGHT CHEST TECHNIQUE: Ultrasound examination was performed of the right chest to evaluate for possible abscess. COMPARISON:  None. FINDINGS: Grayscale and color duplex ultrasound performed in the region of clinical concern on the right chest. No focal fluid identified. Mild edema within the  superficial soft tissues. IMPRESSION: Mild edema of the right chest in the region of clinical concern with no evidence of abscess Electronically Signed   By: Gilmer Mor D.O.   On: 01/04/2018 09:55    Microbiology: Recent Results (from the past 240 hour(s))  Wound or Superficial Culture     Status: None   Collection Time: 01/02/18  9:36 PM  Result Value Ref Range Status   Specimen Description   Final    ABSCESS Performed at [facility], [address], [place], [place] [address]    Special Requests   Final    NONE Performed at [facility], [address] [place], [place] [address]    Gram Stain NO WBC SEEN NO ORGANISMS SEEN  Final   Culture   Final    RARE NORMAL SKIN FLORA Performed at [facility], [address] [place], [place] [address]    Report Status 01/05/2018 FINAL  Final  MRSA PCR Screening     Status: None   Collection Time: 01/03/18  8:26 AM  Result Value Ref Range Status   MRSA by PCR NEGATIVE NEGATIVE Final    Comment:        The GeneXpert MRSA Assay (FDA approved for NASAL specimens only), is one component of a comprehensive MRSA colonization surveillance program. It is not intended to diagnose MRSA infection nor to guide or monitor treatment for MRSA infections. Performed at [facility], [address] [place], [place] [address]   Culture, blood (routine x 2)     Status: None (Preliminary result)   Collection Time: 01/03/18  8:34 AM  Result Value Ref Range Status   Specimen Description   Final    BLOOD RIGHT WRIST Performed at [facility], [address], [place], [place] [address]    Special Requests   Final    BOTTLES DRAWN AEROBIC AND ANAEROBIC Blood Culture results may not be optimal due to an excessive volume of blood received in culture bottles Performed at [facility], [address], [place], [place] [address]    Culture   Final     NO GROWTH 2 DAYS Performed at [facility], [address] [place], [place] [address]    Report Status PENDING  Incomplete  Culture, blood (routine x 2)     Status: None (Preliminary result)   Collection Time: 01/03/18  8:39 AM  Result Value Ref Range Status   Specimen Description   Final    BLOOD RIGHT HAND Performed at [facility], [address], [place], [place] [address]    Special Requests   Final    BOTTLES DRAWN AEROBIC ONLY Blood Culture adequate volume Performed at [facility], [address], [place], [place] [address]    Culture   Final    NO GROWTH 2 DAYS Performed at [facility], [address], [place], [place] [address]    Report Status PENDING  Incomplete     Labs: Basic Metabolic Panel: Recent Labs  Lab 01/02/18 2136 01/03/18 0624 01/04/18 0503 01/05/18 0556  NA 139 139 137 138  K 3.5 3.6 3.9 3.7  CL 108 111 107 107  CO2 24 23 21* 24  GLUCOSE 96 106* 94 90  BUN 14 11 7 6   CREATININE 1.21* 0.86 0.76 0.73  CALCIUM 8.4* 7.7* 7.7* 8.2*  MG  --   --  2.1  --    Liver Function Tests: No results for input(s): AST, ALT, ALKPHOS, BILITOT, PROT, ALBUMIN in the last 168 hours. No results for input(s): LIPASE, AMYLASE in the last 168 hours. No results for input(s): AMMONIA in the last 168 hours. CBC: Recent Labs  Lab 01/02/18 2136 01/03/18 0624 01/04/18 0503 01/05/18 0556  WBC 14.8* 11.4* 8.0 7.9  NEUTROABS 11.7*  --  5.8 5.1  HGB 11.1* 10.0* 9.6* 10.1*  HCT 35.7* 31.8* 31.8* 32.4*  MCV 83.2 82.6 86.4 84.4  PLT 331 279 265 310   Cardiac Enzymes: No results for input(s): CKTOTAL, CKMB, CKMBINDEX, TROPONINI in the last 168 hours. BNP: BNP (last 3 results) No results for input(s): BNP in the last 8760 hours.  ProBNP (last 3 results) No results for  input(s): PROBNP in the last 8760 hours.  CBG: No results for input(s): GLUCAP in the last 168 hours.     Signed:  Albertine GratesFang Sury Wentworth MD, PhD  [facility] 01/05/2018, 12:55 PM

## 2018-01-05 NOTE — Progress Notes (Signed)
Discharge teaching completed with teach back. Discharge instructions given and reviewed with pt. Pt. Understands to call for follow up appointments with [facility]. Pt. To pick up prescriptions at [facility]. Pt. Denies complaints at this time.

## 2018-01-05 NOTE — Progress Notes (Signed)
Wound care instructions given to patient and Mother, Clotilde DieterRosa. Rosa was able to return demonstration dressing change and was successful. Dressing supplies given.

## 2018-01-05 NOTE — Care Management (Signed)
CM consult for PCP. This CM spoke with pt about finding a PCP within her insurance plan. Pt instructed to call her insurance for a list of PCPs covered by her plan. Pt states she plans to start calling around for a PCP as soon as she gets home. Sandford Crazeora Miguelina Fore RN,BSN [phone]

## 2018-01-08 LAB — CULTURE, BLOOD (ROUTINE X 2)
CULTURE: NO GROWTH
Culture: NO GROWTH
Special Requests: ADEQUATE

## 2018-01-18 ENCOUNTER — Encounter: Payer: Self-pay | Admitting: Family Medicine

## 2018-01-18 ENCOUNTER — Encounter (HOSPITAL_BASED_OUTPATIENT_CLINIC_OR_DEPARTMENT_OTHER): Payer: PRIVATE HEALTH INSURANCE | Attending: Internal Medicine

## 2018-01-18 DIAGNOSIS — Z6841 Body Mass Index (BMI) 40.0 and over, adult: Secondary | ICD-10-CM | POA: Diagnosis not present

## 2018-01-18 DIAGNOSIS — Y838 Other surgical procedures as the cause of abnormal reaction of the patient, or of later complication, without mention of misadventure at the time of the procedure: Secondary | ICD-10-CM | POA: Diagnosis not present

## 2018-01-18 DIAGNOSIS — T8189XA Other complications of procedures, not elsewhere classified, initial encounter: Secondary | ICD-10-CM | POA: Insufficient documentation

## 2018-01-18 DIAGNOSIS — F17210 Nicotine dependence, cigarettes, uncomplicated: Secondary | ICD-10-CM | POA: Diagnosis not present

## 2018-01-21 ENCOUNTER — Inpatient Hospital Stay: Payer: Self-pay | Admitting: Family Medicine

## 2018-01-26 DIAGNOSIS — T8189XA Other complications of procedures, not elsewhere classified, initial encounter: Secondary | ICD-10-CM | POA: Diagnosis not present

## 2018-02-01 ENCOUNTER — Inpatient Hospital Stay (INDEPENDENT_AMBULATORY_CARE_PROVIDER_SITE_OTHER): Payer: Self-pay | Admitting: Internal Medicine

## 2018-02-10 ENCOUNTER — Inpatient Hospital Stay: Payer: Self-pay | Admitting: Family Medicine

## 2018-02-18 ENCOUNTER — Encounter (HOSPITAL_BASED_OUTPATIENT_CLINIC_OR_DEPARTMENT_OTHER): Payer: PRIVATE HEALTH INSURANCE | Attending: Internal Medicine

## 2018-06-16 ENCOUNTER — Emergency Department (HOSPITAL_BASED_OUTPATIENT_CLINIC_OR_DEPARTMENT_OTHER)
Admission: EM | Admit: 2018-06-16 | Discharge: 2018-06-16 | Disposition: A | Discharge status: Home / Self Care | Payer: Self-pay | Attending: Emergency Medicine | Admitting: Emergency Medicine

## 2018-06-16 ENCOUNTER — Encounter (HOSPITAL_BASED_OUTPATIENT_CLINIC_OR_DEPARTMENT_OTHER): Payer: Self-pay | Admitting: Adult Health

## 2018-06-16 ENCOUNTER — Other Ambulatory Visit: Payer: Self-pay

## 2018-06-16 DIAGNOSIS — F17210 Nicotine dependence, cigarettes, uncomplicated: Secondary | ICD-10-CM | POA: Insufficient documentation

## 2018-06-16 DIAGNOSIS — N760 Acute vaginitis: Secondary | ICD-10-CM | POA: Insufficient documentation

## 2018-06-16 DIAGNOSIS — J45909 Unspecified asthma, uncomplicated: Secondary | ICD-10-CM | POA: Insufficient documentation

## 2018-06-16 DIAGNOSIS — B9689 Other specified bacterial agents as the cause of diseases classified elsewhere: Secondary | ICD-10-CM | POA: Insufficient documentation

## 2018-06-16 DIAGNOSIS — Z91040 Latex allergy status: Secondary | ICD-10-CM | POA: Insufficient documentation

## 2018-06-16 DIAGNOSIS — R1030 Lower abdominal pain, unspecified: Secondary | ICD-10-CM | POA: Insufficient documentation

## 2018-06-16 DIAGNOSIS — R300 Dysuria: Secondary | ICD-10-CM | POA: Insufficient documentation

## 2018-06-16 DIAGNOSIS — R309 Painful micturition, unspecified: Secondary | ICD-10-CM | POA: Insufficient documentation

## 2018-06-16 LAB — URINALYSIS, ROUTINE W REFLEX MICROSCOPIC
Bilirubin Urine: NEGATIVE
Glucose, UA: NEGATIVE mg/dL
Hgb urine dipstick: NEGATIVE
Ketones, ur: NEGATIVE mg/dL
Leukocytes,Ua: NEGATIVE
Nitrite: NEGATIVE
Protein, ur: NEGATIVE mg/dL
Specific Gravity, Urine: 1.025 (ref 1.005–1.030)
pH: 6.5 (ref 5.0–8.0)

## 2018-06-16 LAB — WET PREP, GENITAL
Sperm: NONE SEEN
Trich, Wet Prep: NONE SEEN
Yeast Wet Prep HPF POC: NONE SEEN

## 2018-06-16 LAB — PREGNANCY, URINE: Preg Test, Ur: NEGATIVE

## 2018-06-16 MED ORDER — METRONIDAZOLE 500 MG PO TABS: 500.0000 mg | ORAL_TABLET | Freq: Two times a day (BID) | ORAL | 0 refills | Status: AC

## 2018-06-16 MED ORDER — KETOROLAC TROMETHAMINE 60 MG/2ML IM SOLN
60.0000 mg | Freq: Once | INTRAMUSCULAR | Status: AC
  Administered 2018-06-16: 60 mg via INTRAMUSCULAR
  Filled 2018-06-16: qty 2

## 2018-06-16 NOTE — ED Provider Notes (Signed)
[facility] EMERGENCY DEPARTMENT Provider Note   CSN: [id number] Arrival date & time: 06/16/18  2129    History   Chief Complaint Chief Complaint  Patient presents with  . Abdominal Pain    HPI Sherri Reid is a 43 y.o. female.     HPI   43yo female with history of asthma presents with concern for lower abdominal pain for one month.  Reports pain feels like a throbbing and pressure in the lower abdomen and worsens with urination.  Urine has foul smell. Has dysuria and also pain in lower abdomen with urination.  Tried cranberry juice and acetaminophen without relief. Has had vginal discharge  Past Medical History:  Diagnosis Date  . Asthma   . History of syphilis    treated  . Vaginal Pap smear, abnormal    f/u ok    Patient Active Problem List   Diagnosis Date Noted  . Non-intractable vomiting   . AKI (acute kidney injury) ([facility]) 01/03/2018  . Chronic anemia 01/03/2018  . Obesity, Class III, BMI 40-49.9 (morbid obesity) ([facility])   . Leukocytosis   . Cellulitis of breast 01/02/2018    Past Surgical History:  Procedure Laterality Date  . NO PAST SURGERIES       OB History    Gravida  1   Para  0   Term  0   Preterm  0   AB  1   Living  0     SAB  1   TAB  0   Ectopic  0   Multiple  0   Live Births               Home Medications    Prior to Admission medications   Medication Sig Start Date End Date Taking? Authorizing Provider  HYDROcodone-acetaminophen (NORCO/VICODIN) 5-325 MG tablet Take 1 tablet by mouth every 4 (four) hours as needed for moderate pain or severe pain. 01/05/18   Florencia Reasons, MD  ibuprofen (ADVIL,MOTRIN) 200 MG tablet Take 600 mg by mouth every 6 (six) hours as needed for headache.    [provider]  metroNIDAZOLE (FLAGYL) 500 MG tablet Take 1 tablet (500 mg total) by mouth 2 (two) times daily for 10 days. 06/16/18 06/26/18  Gareth Morgan, MD    Family History Family History  Problem Relation Age  of Onset  . Hyperlipidemia Mother   . Mental illness Mother   . Hypertension Mother   . Diabetes Father   . Cancer Maternal Aunt   . Cancer Paternal Uncle   . Anesthesia problems Neg Hx     Social History Social History   Tobacco Use  . Smoking status: Current Every Day Smoker    Packs/day: 1.00    Years: 22.00    Pack years: 22.00    Types: Cigarettes  . Smokeless tobacco: Never Used  Substance Use Topics  . Alcohol use: Yes    Comment: Once a month   . Drug use: No     Allergies   Latex and Penicillins   Review of Systems Review of Systems  Constitutional: Negative for fever.  Eyes: Negative for visual disturbance.  Respiratory: Negative for cough and shortness of breath.   Cardiovascular: Negative for chest pain.  Gastrointestinal: Positive for abdominal pain. Negative for diarrhea, nausea and vomiting.  Genitourinary: Positive for dysuria and vaginal discharge. Negative for difficulty urinating.  Musculoskeletal: Negative for back pain and neck pain.  Skin: Negative for rash.  Neurological: Negative for  syncope and headaches.     Physical Exam Updated Vital Signs BP (!) 160/95   Pulse 72   Temp 98.2 F (36.8 C) (Oral)   Resp 18   Ht 5\' 2"  (1.575 m)   Wt 113.4 kg   LMP 06/10/2018 (Exact Date)   SpO2 99%   BMI 45.73 kg/m   Physical Exam Vitals signs and nursing note reviewed.  Constitutional:      General: She is not in acute distress.    Appearance: She is well-developed. She is not diaphoretic.  HENT:     Head: Normocephalic and atraumatic.  Eyes:     Conjunctiva/sclera: Conjunctivae normal.  Neck:     Musculoskeletal: Normal range of motion.  Cardiovascular:     Rate and Rhythm: Normal rate and regular rhythm.  Pulmonary:     Effort: Pulmonary effort is normal. No respiratory distress.  Abdominal:     General: There is no distension.     Palpations: Abdomen is soft.     Tenderness: There is abdominal tenderness in the right lower  quadrant, suprapubic area and left lower quadrant. There is no guarding.  Genitourinary:    Cervix: No cervical motion tenderness.     Uterus: Not tender.      Adnexa:        Right: No tenderness.         Left: No tenderness.    Musculoskeletal:        General: No tenderness.  Skin:    General: Skin is warm and dry.     Findings: No erythema or rash.  Neurological:     Mental Status: She is alert and oriented to person, place, and time.      ED Treatments / Results  Labs (all labs ordered are listed, but only abnormal results are displayed) Labs Reviewed  WET PREP, GENITAL - Abnormal; Notable for the following components:      Result Value   Clue Cells Wet Prep HPF POC PRESENT (*)    WBC, Wet Prep HPF POC MODERATE (*)    All other components within normal limits  PREGNANCY, URINE  URINALYSIS, ROUTINE W REFLEX MICROSCOPIC  GC/CHLAMYDIA PROBE AMP () NOT AT [facility]    EKG None  Radiology No results found.  Procedures Procedures (including critical care time)  Medications Ordered in ED Medications  ketorolac (TORADOL) injection 60 mg (60 mg Intramuscular Given 06/16/18 2301)     Initial Impression / Assessment and Plan / ED Course  I have reviewed the triage vital signs and the nursing notes.  Pertinent labs & imaging results that were available during my care of the patient were reviewed by me and considered in my medical decision making (see chart for details).        43yo female presents with concern for one month of lower abdominal pain and discharge. No sign of UTI. Given duration and quality of pain have low suspicion for appendicitis, diverticulitis, ovarian torsion, nephrolithiasis.  No significant discharge, no uterine tenderness, doubt PID. Recommend outpatient follow up for further evaluation. Given flagyl for BV.  Patient discharged in stable condition with understanding of reasons to return.   Final Clinical Impressions(s) / ED Diagnoses    Final diagnoses:  Lower abdominal pain  Bacterial vaginosis    ED Discharge Orders         Ordered    metroNIDAZOLE (FLAGYL) 500 MG tablet  2 times daily     06/16/18 2302  Alvira MondaySchlossman, Bliss Tsang, MD 06/17/18 2056

## 2018-06-16 NOTE — ED Triage Notes (Signed)
Presents with one month of lower abdominal pain described as throbbing and constant in the lower abdomen, She endorses vaginal discharge but the discharge has stopped and only lasted a week. Endorses foul odor to urine and pain with urinating that is sharp. She has tried cranberry juice and acetaminophen without relief

## 2018-06-18 LAB — GC/CHLAMYDIA PROBE AMP (~~LOC~~) NOT AT ARMC
Chlamydia: NEGATIVE
Neisseria Gonorrhea: NEGATIVE

## 2018-06-24 ENCOUNTER — Inpatient Hospital Stay: Payer: Self-pay | Admitting: Primary Care

## 2018-07-14 ENCOUNTER — Telehealth: Payer: Self-pay | Admitting: Obstetrics & Gynecology

## 2018-07-14 NOTE — Telephone Encounter (Signed)
Spoke with patient about her appointment on 7/9 @ 10:55. Patient instructed to wear a face mask for the entire appointment and no visitors are allowed during the visit. Patient screened for covid symptoms and denied having any.

## 2018-07-15 ENCOUNTER — Encounter: Payer: Self-pay | Admitting: Obstetrics & Gynecology

## 2018-07-15 ENCOUNTER — Other Ambulatory Visit: Payer: Self-pay

## 2018-07-15 ENCOUNTER — Ambulatory Visit: Payer: Self-pay | Admitting: Obstetrics & Gynecology

## 2018-07-15 VITALS — BP 154/98 | HR 81 | Wt 261.4 lb

## 2018-07-15 DIAGNOSIS — R102 Pelvic and perineal pain: Secondary | ICD-10-CM

## 2018-07-15 DIAGNOSIS — Z0000 Encounter for general adult medical examination without abnormal findings: Secondary | ICD-10-CM

## 2018-07-15 NOTE — Progress Notes (Signed)
   Subjective:    Patient ID: Sherri Reid, female    DOB: [birth year], 43 y.o.   MRN: [id number]  HPI 43 yo single P0 here today with a 2 month h/o pelvic pain, constant and daily, worse with a "sharper pain" after voiding. She was seen in the ED about a month ago with this same problem. She was treated for BV with flagyl but is no better. Pain is not worse with periods.   Her periods are monthly.    Review of Systems Not in a relationship, same sex when she it in a relationship. Works at [employer] Her periods are 3-5 days.    Objective:   Physical Exam Breathing, conversing, and ambulating normally Well nourished, well hydrated Black female, no apparent distress Abd- benign, obese Vulva, vagina- normal Cervix- nulliparous Acanthosis nigricans noted- discussed risks of DM with patient       Assessment & Plan:  Preventative care- I have sent Sherri Reid a message to get her free pap/mammo Anemia noted on labs- she denies heavy periods, rec iron daily Pelvic pain- gyn u/s ordered Pelvic pain- check u/a and Urine culture Rec increase water (her specific gravity on u/a last month was 1.025) Rec apply for medicaid Needs appt with GI for anemia Refer to primary care provider

## 2018-07-16 LAB — URINALYSIS
Bilirubin, UA: NEGATIVE
Glucose, UA: NEGATIVE
Ketones, UA: NEGATIVE
Leukocytes,UA: NEGATIVE
Nitrite, UA: NEGATIVE
Protein,UA: NEGATIVE
RBC, UA: NEGATIVE
Specific Gravity, UA: 1.02 (ref 1.005–1.030)
Urobilinogen, Ur: 0.2 mg/dL (ref 0.2–1.0)
pH, UA: 6 (ref 5.0–7.5)

## 2018-07-16 LAB — URINE CULTURE

## 2018-07-29 ENCOUNTER — Other Ambulatory Visit: Payer: Self-pay

## 2018-07-29 ENCOUNTER — Ambulatory Visit (HOSPITAL_COMMUNITY)
Admission: RE | Admit: 2018-07-29 | Discharge: 2018-07-29 | Disposition: A | Discharge status: Home / Self Care | Payer: Self-pay | Source: Ambulatory Visit | Attending: Obstetrics & Gynecology | Admitting: Obstetrics & Gynecology

## 2018-07-29 DIAGNOSIS — R102 Pelvic and perineal pain: Secondary | ICD-10-CM | POA: Insufficient documentation

## 2018-08-20 ENCOUNTER — Ambulatory Visit (INDEPENDENT_AMBULATORY_CARE_PROVIDER_SITE_OTHER): Payer: Self-pay | Admitting: Obstetrics & Gynecology

## 2018-08-20 ENCOUNTER — Other Ambulatory Visit: Payer: Self-pay

## 2018-08-20 ENCOUNTER — Encounter: Payer: Self-pay | Admitting: Family Medicine

## 2018-08-20 ENCOUNTER — Encounter: Payer: Self-pay | Admitting: Obstetrics & Gynecology

## 2018-08-20 VITALS — BP 146/93 | HR 91 | Temp 98.5°F

## 2018-08-20 DIAGNOSIS — R102 Pelvic and perineal pain: Secondary | ICD-10-CM

## 2018-08-20 MED ORDER — MEDROXYPROGESTERONE ACETATE 150 MG/ML IM SUSP
150.0000 mg | Freq: Once | INTRAMUSCULAR | Status: AC
  Administered 2018-08-20: 12:00:00 150 mg via INTRAMUSCULAR
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
## 2018-08-22 ENCOUNTER — Emergency Department (HOSPITAL_COMMUNITY)
Admission: EM | Admit: 2018-08-22 | Discharge: 2018-08-22 | Disposition: A | Discharge status: Home / Self Care | Payer: No Typology Code available for payment source | Attending: Emergency Medicine | Admitting: Emergency Medicine

## 2018-08-22 ENCOUNTER — Emergency Department (HOSPITAL_COMMUNITY): Payer: No Typology Code available for payment source

## 2018-08-22 ENCOUNTER — Other Ambulatory Visit: Payer: Self-pay

## 2018-08-22 ENCOUNTER — Encounter (HOSPITAL_COMMUNITY): Payer: Self-pay | Admitting: Emergency Medicine

## 2018-08-22 DIAGNOSIS — Z79899 Other long term (current) drug therapy: Secondary | ICD-10-CM | POA: Diagnosis not present

## 2018-08-22 DIAGNOSIS — M79641 Pain in right hand: Secondary | ICD-10-CM | POA: Diagnosis present

## 2018-08-22 DIAGNOSIS — F17210 Nicotine dependence, cigarettes, uncomplicated: Secondary | ICD-10-CM | POA: Insufficient documentation

## 2018-08-22 MED ORDER — IBUPROFEN 600 MG PO TABS: 600.0000 mg | ORAL_TABLET | Freq: Four times a day (QID) | ORAL | 0 refills | Status: DC | PRN

## 2018-08-22 MED ORDER — IBUPROFEN 400 MG PO TABS
600.0000 mg | ORAL_TABLET | Freq: Once | ORAL | Status: AC
  Administered 2018-08-22: 600 mg via ORAL
  Filled 2018-08-22: qty 1
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
## 2018-08-22 NOTE — ED Notes (Signed)
Patient verbalizes understanding of discharge instructions. Opportunity for questioning and answers were provided. Armband removed by staff, pt discharged from ED ambulatory.   

## 2018-10-11 ENCOUNTER — Other Ambulatory Visit: Payer: Self-pay | Admitting: Obstetrics and Gynecology

## 2018-10-11 DIAGNOSIS — R928 Other abnormal and inconclusive findings on diagnostic imaging of breast: Secondary | ICD-10-CM

## 2018-10-12 ENCOUNTER — Other Ambulatory Visit: Payer: Self-pay

## 2018-11-24 ENCOUNTER — Other Ambulatory Visit: Payer: Self-pay | Admitting: Radiology

## 2019-01-19 ENCOUNTER — Other Ambulatory Visit: Payer: Self-pay

## 2019-01-19 ENCOUNTER — Emergency Department (HOSPITAL_BASED_OUTPATIENT_CLINIC_OR_DEPARTMENT_OTHER)
Admission: EM | Admit: 2019-01-19 | Discharge: 2019-01-19 | Disposition: A | Discharge status: Home / Self Care | Payer: Self-pay | Attending: Emergency Medicine | Admitting: Emergency Medicine

## 2019-01-19 ENCOUNTER — Encounter (HOSPITAL_BASED_OUTPATIENT_CLINIC_OR_DEPARTMENT_OTHER): Payer: Self-pay

## 2019-01-19 DIAGNOSIS — Y929 Unspecified place or not applicable: Secondary | ICD-10-CM | POA: Insufficient documentation

## 2019-01-19 DIAGNOSIS — Y939 Activity, unspecified: Secondary | ICD-10-CM | POA: Insufficient documentation

## 2019-01-19 DIAGNOSIS — S39012A Strain of muscle, fascia and tendon of lower back, initial encounter: Secondary | ICD-10-CM | POA: Insufficient documentation

## 2019-01-19 DIAGNOSIS — Z880 Allergy status to penicillin: Secondary | ICD-10-CM | POA: Insufficient documentation

## 2019-01-19 DIAGNOSIS — F17210 Nicotine dependence, cigarettes, uncomplicated: Secondary | ICD-10-CM | POA: Insufficient documentation

## 2019-01-19 DIAGNOSIS — I10 Essential (primary) hypertension: Secondary | ICD-10-CM | POA: Insufficient documentation

## 2019-01-19 DIAGNOSIS — Z91040 Latex allergy status: Secondary | ICD-10-CM | POA: Insufficient documentation

## 2019-01-19 DIAGNOSIS — X58XXXA Exposure to other specified factors, initial encounter: Secondary | ICD-10-CM | POA: Insufficient documentation

## 2019-01-19 DIAGNOSIS — J45909 Unspecified asthma, uncomplicated: Secondary | ICD-10-CM | POA: Insufficient documentation

## 2019-01-19 DIAGNOSIS — Y999 Unspecified external cause status: Secondary | ICD-10-CM | POA: Insufficient documentation

## 2019-01-19 HISTORY — DX: Essential (primary) hypertension: I10

## 2019-01-19 LAB — URINALYSIS, ROUTINE W REFLEX MICROSCOPIC
Bilirubin Urine: NEGATIVE
Glucose, UA: NEGATIVE mg/dL
Hgb urine dipstick: NEGATIVE
Ketones, ur: NEGATIVE mg/dL
Leukocytes,Ua: NEGATIVE
Nitrite: NEGATIVE
Protein, ur: NEGATIVE mg/dL
Specific Gravity, Urine: 1.025 (ref 1.005–1.030)
pH: 6 (ref 5.0–8.0)

## 2019-01-19 LAB — PREGNANCY, URINE: Preg Test, Ur: NEGATIVE

## 2019-01-19 MED ORDER — LIDOCAINE 5 % EX PTCH: 1.0000 | MEDICATED_PATCH | CUTANEOUS | 0 refills | Status: DC

## 2019-01-19 MED ORDER — KETOROLAC TROMETHAMINE 30 MG/ML IJ SOLN
30.0000 mg | Freq: Once | INTRAMUSCULAR | Status: AC
  Administered 2019-01-19: 30 mg via INTRAMUSCULAR
  Filled 2019-01-19: qty 1

## 2019-01-19 MED ORDER — IBUPROFEN 800 MG PO TABS: 800.0000 mg | ORAL_TABLET | Freq: Three times a day (TID) | ORAL | 0 refills | Status: DC

## 2019-01-19 NOTE — ED Notes (Signed)
OxyContin taken at approx 5pm yesterday per pt

## 2019-01-19 NOTE — ED Triage Notes (Signed)
X 2 days lower back pain that radiates to abd, hurts on movement, constant sharp pain. Denies injury. Alieve taken @ approx 2000 yesterday with little relief.

## 2019-01-19 NOTE — ED Provider Notes (Signed)
[facility] EMERGENCY DEPARTMENT Provider Note   CSN: [id number] Arrival date & time: 01/19/19  0113     History Chief Complaint  Patient presents with  . Back Pain    Sherri Reid is a 44 y.o. female.  The history is provided by the patient.  Back Pain Location:  Sacro-iliac joint Quality:  Stabbing Radiates to:  Does not radiate Pain severity:  Severe Pain is:  Same all the time Onset quality:  Sudden Duration:  2 days Timing:  Constant Progression:  Unchanged Chronicity:  New Context: not physical stress and not recent illness   Relieved by:  None tried Worsened by:  Twisting Ineffective treatments:  Narcotics and ibuprofen (oxycontin and alleve) Associated symptoms: no abdominal pain, no abdominal swelling, no bladder incontinence, no bowel incontinence, no chest pain, no dysuria, no fever, no headaches, no leg pain, no numbness, no paresthesias, no pelvic pain, no perianal numbness, no tingling, no weakness and no weight loss   No incontinence no weakness nor numbness      Past Medical History:  Diagnosis Date  . Asthma   . History of syphilis    treated  . Hypertension   . Vaginal Pap smear, abnormal    f/u ok    Patient Active Problem List   Diagnosis Date Noted  . Non-intractable vomiting   . AKI (acute kidney injury) (HCC) 01/03/2018  . Chronic anemia 01/03/2018  . Obesity, Class III, BMI 40-49.9 (morbid obesity) (HCC)   . Leukocytosis   . Cellulitis of breast 01/02/2018    Past Surgical History:  Procedure Laterality Date  . NO PAST SURGERIES       OB History    Gravida  1   Para  0   Term  0   Preterm  0   AB  1   Living  0     SAB  1   TAB  0   Ectopic  0   Multiple  0   Live Births              Family History  Problem Relation Age of Onset  . Hyperlipidemia Mother   . Mental illness Mother   . Hypertension Mother   . Diabetes Father   . Cancer Maternal Aunt   . Cancer Paternal Uncle   .  Anesthesia problems Neg Hx     Social History   Tobacco Use  . Smoking status: Current Every Day Smoker    Packs/day: 1.00    Years: 22.00    Pack years: 22.00    Types: Cigarettes  . Smokeless tobacco: Never Used  Substance Use Topics  . Alcohol use: Yes    Comment: occ  . Drug use: No    Home Medications Prior to Admission medications   Medication Sig Start Date End Date Taking? Authorizing Provider  diphenhydramine-acetaminophen (TYLENOL PM) 25-500 MG TABS tablet Take 1 tablet by mouth at bedtime as needed.    [provider]  HYDROcodone-acetaminophen (NORCO/VICODIN) 5-325 MG tablet Take 1 tablet by mouth every 4 (four) hours as needed for moderate pain or severe pain. Patient not taking: Reported on 07/15/2018 01/05/18   Albertine Grates, MD  ibuprofen (ADVIL) 600 MG tablet Take 1 tablet (600 mg total) by mouth every 6 (six) hours as needed. 08/22/18   Horton, Mayer Masker, MD  ibuprofen (ADVIL) 800 MG tablet Take 1 tablet (800 mg total) by mouth 3 (three) times daily. 01/19/19   Madisan Bice, MD  ibuprofen (ADVIL,MOTRIN) 200 MG tablet Take 600 mg by mouth every 6 (six) hours as needed for headache.    [provider]  lidocaine (LIDODERM) 5 % Place 1 patch onto the skin daily. Remove & Discard patch within 12 hours or as directed by MD 01/19/19   Randal Buba, Tanner Yeley, MD    Allergies    Latex and Penicillins  Review of Systems   Review of Systems  Constitutional: Negative for fever and weight loss.  HENT: Negative for congestion.   Eyes: Negative for visual disturbance.  Respiratory: Negative for cough and shortness of breath.   Cardiovascular: Negative for chest pain.  Gastrointestinal: Negative for abdominal pain and bowel incontinence.  Genitourinary: Negative for bladder incontinence, difficulty urinating, dysuria and pelvic pain.  Musculoskeletal: Positive for back pain. Negative for gait problem.  Neurological: Negative for tingling, weakness, numbness, headaches  and paresthesias.  All other systems reviewed and are negative.   Physical Exam Updated Vital Signs BP (!) 143/69 (BP Location: Right Arm)   Pulse 73   Temp 98.1 F (36.7 C) (Oral)   Resp 16   Ht 5\' 2"  (1.575 m)   Wt 113.4 kg   LMP 12/12/2018   SpO2 100%   BMI 45.73 kg/m   Physical Exam Vitals and nursing note reviewed.  Constitutional:      General: She is not in acute distress.    Appearance: Normal appearance.  HENT:     Head: Normocephalic and atraumatic.     Nose: Nose normal.  Eyes:     Conjunctiva/sclera: Conjunctivae normal.     Pupils: Pupils are equal, round, and reactive to light.  Cardiovascular:     Rate and Rhythm: Normal rate and regular rhythm.     Pulses: Normal pulses.     Heart sounds: Normal heart sounds.  Pulmonary:     Effort: Pulmonary effort is normal.     Breath sounds: Normal breath sounds.  Abdominal:     General: Abdomen is flat. Bowel sounds are normal.     Tenderness: There is no abdominal tenderness. There is no guarding or rebound.  Musculoskeletal:        General: Normal range of motion.     Cervical back: Normal, normal range of motion and neck supple.     Thoracic back: Normal.     Lumbar back: Normal.       Back:  Skin:    General: Skin is warm and dry.     Capillary Refill: Capillary refill takes less than 2 seconds.  Neurological:     General: No focal deficit present.     Mental Status: She is alert and oriented to person, place, and time.     Deep Tendon Reflexes: Reflexes normal.  Psychiatric:        Mood and Affect: Mood normal.        Behavior: Behavior normal.     ED Results / Procedures / Treatments   Labs (all labs ordered are listed, but only abnormal results are displayed) Results for orders placed or performed during the hospital encounter of 01/19/19  Urinalysis, Routine w reflex microscopic  Result Value Ref Range   Color, Urine YELLOW YELLOW   APPearance CLEAR CLEAR   Specific Gravity, Urine 1.025  1.005 - 1.030   pH 6.0 5.0 - 8.0   Glucose, UA NEGATIVE NEGATIVE mg/dL   Hgb urine dipstick NEGATIVE NEGATIVE   Bilirubin Urine NEGATIVE NEGATIVE   Ketones, ur NEGATIVE NEGATIVE mg/dL   Protein,  ur NEGATIVE NEGATIVE mg/dL   Nitrite NEGATIVE NEGATIVE   Leukocytes,Ua NEGATIVE NEGATIVE  Pregnancy, urine  Result Value Ref Range   Preg Test, Ur NEGATIVE NEGATIVE   No results found.  Radiology No results found.  Procedures Procedures (including critical care time)  Medications Ordered in ED Medications  ketorolac (TORADOL) 30 MG/ML injection 30 mg (30 mg Intramuscular Given 01/19/19 0221)    ED Course  I have reviewed the triage vital signs and the nursing notes.  Pertinent labs & imaging results that were available during my care of the patient were reviewed by me and considered in my medical decision making (see chart for details).   Exam is consistent with LS strain.  The pain is below the termination of the cord and there are no signs of fracture.  She does not require imaging at this time.  Will treat with NSAIDs and lidoderm.    Sherri Reid was evaluated in Emergency Department on 01/19/2019 for the symptoms described in the history of present illness. She was evaluated in the context of the global COVID-19 pandemic, which necessitated consideration that the patient might be at risk for infection with the SARS-CoV-2 virus that causes COVID-19. Institutional protocols and algorithms that pertain to the evaluation of patients at risk for COVID-19 are in a state of rapid change based on information released by regulatory bodies including the CDC and federal and state organizations. These policies and algorithms were followed during the patient's care in the ED. Final Clinical Impression(s) / ED Diagnoses Final diagnoses:  Lumbosacral strain, initial encounter  Return for intractable cough, coughing up blood,fevers >100.4 unrelieved by medication, shortness of breath,  intractable vomiting, chest pain, shortness of breath, weakness,numbness, changes in speech, facial asymmetry,abdominal pain, passing out,Inability to tolerate liquids or food, cough, altered mental status or any concerns. No signs of systemic illness or infection. The patient is nontoxic-appearing on exam and vital signs are within normal limits.   I have reviewed the triage vital signs and the nursing notes. Pertinent labs &imaging results that were available during my care of the patient were reviewed by me and considered in my medical decision making (see chart for details).  After history, exam, and medical workup I feel the patient has been appropriately medically screened and is safe for discharge home. Pertinent diagnoses were discussed with the patient. Patient was given return     Rx / DC Orders ED Discharge Orders         Ordered    lidocaine (LIDODERM) 5 %  Every 24 hours     01/19/19 0217    ibuprofen (ADVIL) 800 MG tablet  3 times daily     01/19/19 0217           Joette Schmoker, MD 01/19/19 3419

## 2019-01-27 ENCOUNTER — Encounter (HOSPITAL_COMMUNITY): Payer: Self-pay | Admitting: Emergency Medicine

## 2019-01-27 ENCOUNTER — Emergency Department (HOSPITAL_COMMUNITY)
Admission: EM | Admit: 2019-01-27 | Discharge: 2019-01-28 | Disposition: A | Discharge status: Home / Self Care | Payer: Self-pay | Attending: Emergency Medicine | Admitting: Emergency Medicine

## 2019-01-27 ENCOUNTER — Other Ambulatory Visit: Payer: Self-pay

## 2019-01-27 DIAGNOSIS — M5442 Lumbago with sciatica, left side: Secondary | ICD-10-CM | POA: Insufficient documentation

## 2019-01-27 DIAGNOSIS — I10 Essential (primary) hypertension: Secondary | ICD-10-CM | POA: Insufficient documentation

## 2019-01-27 DIAGNOSIS — Z91040 Latex allergy status: Secondary | ICD-10-CM | POA: Insufficient documentation

## 2019-01-27 DIAGNOSIS — F17210 Nicotine dependence, cigarettes, uncomplicated: Secondary | ICD-10-CM | POA: Insufficient documentation

## 2019-01-27 DIAGNOSIS — J45909 Unspecified asthma, uncomplicated: Secondary | ICD-10-CM | POA: Insufficient documentation

## 2019-01-27 DIAGNOSIS — Z791 Long term (current) use of non-steroidal anti-inflammatories (NSAID): Secondary | ICD-10-CM | POA: Insufficient documentation

## 2019-01-27 LAB — CBC WITH DIFFERENTIAL/PLATELET
Abs Immature Granulocytes: 0.02 10*3/uL (ref 0.00–0.07)
Basophils Absolute: 0 10*3/uL (ref 0.0–0.1)
Basophils Relative: 0 %
Eosinophils Absolute: 0.1 10*3/uL (ref 0.0–0.5)
Eosinophils Relative: 1 %
HCT: 40.3 % (ref 36.0–46.0)
Hemoglobin: 12.9 g/dL (ref 12.0–15.0)
Immature Granulocytes: 0 %
Lymphocytes Relative: 49 %
Lymphs Abs: 3 10*3/uL (ref 0.7–4.0)
MCH: 26.3 pg (ref 26.0–34.0)
MCHC: 32 g/dL (ref 30.0–36.0)
MCV: 82.1 fL (ref 80.0–100.0)
Monocytes Absolute: 0.4 10*3/uL (ref 0.1–1.0)
Monocytes Relative: 6 %
Neutro Abs: 2.7 10*3/uL (ref 1.7–7.7)
Neutrophils Relative %: 44 %
Platelets: 313 10*3/uL (ref 150–400)
RBC: 4.91 MIL/uL (ref 3.87–5.11)
RDW: 14 % (ref 11.5–15.5)
WBC: 6.2 10*3/uL (ref 4.0–10.5)
nRBC: 0 % (ref 0.0–0.2)

## 2019-01-27 LAB — URINALYSIS, ROUTINE W REFLEX MICROSCOPIC
Bilirubin Urine: NEGATIVE
Glucose, UA: NEGATIVE mg/dL
Hgb urine dipstick: NEGATIVE
Ketones, ur: NEGATIVE mg/dL
Leukocytes,Ua: NEGATIVE
Nitrite: NEGATIVE
Protein, ur: NEGATIVE mg/dL
Specific Gravity, Urine: 1.021 (ref 1.005–1.030)
pH: 6 (ref 5.0–8.0)

## 2019-01-27 LAB — BASIC METABOLIC PANEL
Anion gap: 9 (ref 5–15)
BUN: 12 mg/dL (ref 6–20)
CO2: 26 mmol/L (ref 22–32)
Calcium: 8.9 mg/dL (ref 8.9–10.3)
Chloride: 102 mmol/L (ref 98–111)
Creatinine, Ser: 0.97 mg/dL (ref 0.44–1.00)
GFR calc Af Amer: >60 mL/min (ref >60)
GFR calc non Af Amer: >60 mL/min (ref >60)
Glucose, Bld: 84 mg/dL (ref 70–99)
Potassium: 4.6 mmol/L (ref 3.5–5.1)
Sodium: 137 mmol/L (ref 135–145)

## 2019-01-27 NOTE — ED Triage Notes (Signed)
Patient reports low back pain for 2 weeks , denies injury/ no hematuria or dysuria , denies fever or chills , pain increases with movement .

## 2019-01-27 NOTE — ED Notes (Addendum)
Pt called to recheck vital signs, no answer x 1

## 2019-01-28 ENCOUNTER — Emergency Department (HOSPITAL_COMMUNITY): Payer: Self-pay

## 2019-01-28 LAB — PREGNANCY, URINE: Preg Test, Ur: NEGATIVE

## 2019-01-28 MED ORDER — OXYCODONE-ACETAMINOPHEN 5-325 MG PO TABS: 1.0000 | ORAL_TABLET | ORAL | 0 refills | Status: DC | PRN

## 2019-01-28 MED ORDER — METHOCARBAMOL 500 MG PO TABS
500.0000 mg | ORAL_TABLET | Freq: Once | ORAL | Status: AC
  Administered 2019-01-28: 500 mg via ORAL
  Filled 2019-01-28: qty 1

## 2019-01-28 MED ORDER — METHYLPREDNISOLONE 4 MG PO TBPK: ORAL_TABLET | ORAL | 0 refills | Status: DC

## 2019-01-28 MED ORDER — OXYCODONE-ACETAMINOPHEN 5-325 MG PO TABS
1.0000 | ORAL_TABLET | Freq: Once | ORAL | Status: AC
  Administered 2019-01-28: 1 via ORAL
  Filled 2019-01-28: qty 1

## 2019-01-28 NOTE — ED Notes (Signed)
Pt returned from xray

## 2019-01-28 NOTE — ED Provider Notes (Signed)
[facility] EMERGENCY DEPARTMENT Provider Note   CSN: [id number] Arrival date & time: 01/27/19  1956     History Chief Complaint  Patient presents with  . Back Pain    Sherri Reid is a 44 y.o. female.  The history is provided by the patient and medical records.  Back Pain   44 y.o. F with hx of asthma, HTN, presenting to the ED for back pain.  States this has been ongoing x 2 weeks.  She was seen at [facility] on 01/19/19 and given motrin with lidoderm patches without improvement.  Pain overall is unchanged, remains localized mostly to left lower back with radiation down the left leg.  Worse with movement and position changes.  She denies any heavy lifting, falls, or direct trauma to the back.  She does report standing upright for long hours while working on hard floor.  She does stand on a mat and tries to wear supportive shoes.  No fever or chills.  No numbness or weakness of the legs.  No bowel or bladder incontinence.  No urinary symptoms.  She has never had issues like this in the past.  She denies any history of back injuries or surgeries.  Past Medical History:  Diagnosis Date  . Asthma   . History of syphilis    treated  . Hypertension   . Vaginal Pap smear, abnormal    f/u ok    Patient Active Problem List   Diagnosis Date Noted  . Non-intractable vomiting   . AKI (acute kidney injury) ([place]) 01/03/2018  . Chronic anemia 01/03/2018  . Obesity, Class III, BMI 40-49.9 (morbid obesity) ([place])   . Leukocytosis   . Cellulitis of breast 01/02/2018    Past Surgical History:  Procedure Laterality Date  . NO PAST SURGERIES       OB History    Gravida  1   Para  0   Term  0   Preterm  0   AB  1   Living  0     SAB  1   TAB  0   Ectopic  0   Multiple  0   Live Births              Family History  Problem Relation Age of Onset  . Hyperlipidemia Mother   . Mental illness Mother   . Hypertension Mother   . Diabetes Father   .  Cancer Maternal Aunt   . Cancer Paternal Uncle   . Anesthesia problems Neg Hx     Social History   Tobacco Use  . Smoking status: Current Every Day Smoker    Packs/day: 1.00    Years: 22.00    Pack years: 22.00    Types: Cigarettes  . Smokeless tobacco: Never Used  Substance Use Topics  . Alcohol use: Yes    Comment: occ  . Drug use: No    Home Medications Prior to Admission medications   Medication Sig Start Date End Date Taking? Authorizing Provider  diphenhydramine-acetaminophen (TYLENOL PM) 25-500 MG TABS tablet Take 1 tablet by mouth at bedtime as needed.    [provider]  HYDROcodone-acetaminophen (NORCO/VICODIN) 5-325 MG tablet Take 1 tablet by mouth every 4 (four) hours as needed for moderate pain or severe pain. Patient not taking: Reported on 07/15/2018 01/05/18   Florencia Reasons, MD  ibuprofen (ADVIL) 600 MG tablet Take 1 tablet (600 mg total) by mouth every 6 (six) hours as needed. 08/22/18  Horton, Mayer Masker, MD  ibuprofen (ADVIL) 800 MG tablet Take 1 tablet (800 mg total) by mouth 3 (three) times daily. 01/19/19   Palumbo, April, MD  ibuprofen (ADVIL,MOTRIN) 200 MG tablet Take 600 mg by mouth every 6 (six) hours as needed for headache.    [provider]  lidocaine (LIDODERM) 5 % Place 1 patch onto the skin daily. Remove & Discard patch within 12 hours or as directed by MD 01/19/19   Nicanor Alcon, April, MD    Allergies    Latex and Penicillins  Review of Systems   Review of Systems  Musculoskeletal: Positive for back pain.  All other systems reviewed and are negative.   Physical Exam Updated Vital Signs BP 123/62   Pulse 81   Temp 98.3 F (36.8 C) (Oral)   Resp 20   LMP 01/24/2019   SpO2 100%   Physical Exam Vitals and nursing note reviewed.  Constitutional:      Appearance: She is well-developed.     Comments: Morbidly obese  HENT:     Head: Normocephalic and atraumatic.  Eyes:     Conjunctiva/sclera: Conjunctivae normal.     Pupils:  Pupils are equal, round, and reactive to light.  Cardiovascular:     Rate and Rhythm: Normal rate and regular rhythm.     Heart sounds: Normal heart sounds.  Pulmonary:     Effort: Pulmonary effort is normal.     Breath sounds: Normal breath sounds.  Abdominal:     General: Bowel sounds are normal.     Palpations: Abdomen is soft.  Musculoskeletal:        General: Normal range of motion.     Cervical back: Normal range of motion.     Comments: Tenderness left SI joint, palpation of this area induces shooting pain down left leg, normal strength/sensation of both legs, normal gait  Skin:    General: Skin is warm and dry.  Neurological:     Mental Status: She is alert and oriented to person, place, and time.     ED Results / Procedures / Treatments   Labs (all labs ordered are listed, but only abnormal results are displayed) Labs Reviewed  CBC WITH DIFFERENTIAL/PLATELET  BASIC METABOLIC PANEL  URINALYSIS, ROUTINE W REFLEX MICROSCOPIC  PREGNANCY, URINE  I-STAT BETA HCG BLOOD, ED ([facility], [facility], [facility] ONLY)    EKG None  Radiology DG Lumbar Spine Complete  Result Date: 01/28/2019 CLINICAL DATA:  Lumbosacral back pain for 2 weeks. Pain radiates to right leg. No known injury. EXAM: LUMBAR SPINE - COMPLETE 4+ VIEW COMPARISON:  None. FINDINGS: The alignment is maintained. Vertebral body heights are normal. There is no listhesis. The posterior elements are intact. Minor disc space narrowing and endplate spurring at L4-L5 and L5-S1 no fracture. Mild facet hypertrophy at L4-L5. The sacroiliac joints are congruent. IMPRESSION: Mild degenerative disc disease in the lower lumbar spine. Mild facet hypertrophy. No acute bony abnormality. Electronically Signed   By: Narda Rutherford M.D.   On: 01/28/2019 04:35    Procedures Procedures (including critical care time)  Medications Ordered in ED Medications  oxyCODONE-acetaminophen (PERCOCET/ROXICET) 5-325 MG per tablet 1 tablet (1 tablet Oral Given  01/28/19 0352)  methocarbamol (ROBAXIN) tablet 500 mg (500 mg Oral Given 01/28/19 0352)    ED Course  I have reviewed the triage vital signs and the nursing notes.  Pertinent labs & imaging results that were available during my care of the patient were reviewed by me and considered in  my medical decision making (see chart for details).    MDM Rules/Calculators/A&P  44 y.o. F here with left sided back pain ongoing x 2 weeks.  Seen at [facility] for same, given motrin and lidoderm patches without relief.  Denies new injury, trauma, or falls.  She is afebrile, non-toxic.  Continues to have pain from left lower back radiating down left leg, worse with movement.  No focal neurologic deficits on exam concerning for cauda equina.  No hx of IVDU or cancer, no signs of sepsis here so feel infectious spinal etiology less likely.  Symptoms and exam findings most consistent with sciatica/lumbar radiculopathy.  Lab work is overall reassuring.  She has not had any prior imaging the lumbar spine films were obtained, degenerative disc disease noted in the lower lumbar spine, endplate spurring without fracture.  Patient was treated here with Percocet and Robaxin and she does appear much more comfortable, now able to lie on her back which she was not previously able to do.  Does not appear she has been prescribed any narcotics in over a year, feel reasonable to try a short course of pain medication and Medrol Dosepak to see if this will improve.  She does not have current PCP so will refer to wellness clinic for follow-up.  She was also given work note for light duty for 1 week.  She may return here for any new or acute changes.  Final Clinical Impression(s) / ED Diagnoses Final diagnoses:  Acute left-sided low back pain with left-sided sciatica    Rx / DC Orders ED Discharge Orders         Ordered    oxyCODONE-acetaminophen (PERCOCET) 5-325 MG tablet  Every 4 hours PRN     01/28/19 0605    methylPREDNISolone (MEDROL  DOSEPAK) 4 MG TBPK tablet     01/28/19 0605           Garlon Hatchet, PA-C 01/28/19 0615    Ward, Layla Maw, DO 01/28/19 [phone]

## 2019-01-28 NOTE — Discharge Instructions (Signed)
Take the prescribed medication as directed.  Can try using heat/ice on the back as well. Follow-up with the wellness clinic-- I would call today to see if you can get a follow-up appt scheduled. Return to the ED for new or worsening symptoms.

## 2019-01-28 NOTE — ED Notes (Signed)
Pt transported to xray 

## 2019-03-15 ENCOUNTER — Ambulatory Visit (HOSPITAL_COMMUNITY)
Admission: EM | Admit: 2019-03-15 | Discharge: 2019-03-15 | Disposition: A | Discharge status: Home / Self Care | Payer: HRSA Program | Attending: Family Medicine | Admitting: Family Medicine

## 2019-03-15 ENCOUNTER — Encounter (HOSPITAL_COMMUNITY): Payer: Self-pay

## 2019-03-15 ENCOUNTER — Other Ambulatory Visit: Payer: Self-pay

## 2019-03-15 DIAGNOSIS — Z20822 Contact with and (suspected) exposure to covid-19: Secondary | ICD-10-CM | POA: Diagnosis not present

## 2019-03-15 DIAGNOSIS — I10 Essential (primary) hypertension: Secondary | ICD-10-CM | POA: Diagnosis present

## 2019-03-15 DIAGNOSIS — Z760 Encounter for issue of repeat prescription: Secondary | ICD-10-CM

## 2019-03-15 MED ORDER — VALSARTAN-HYDROCHLOROTHIAZIDE 160-25 MG PO TABS: 1.0000 | ORAL_TABLET | Freq: Every day | ORAL | 1 refills | Status: DC

## 2019-03-15 NOTE — ED Triage Notes (Signed)
Pt presents for covid testing for work with no known symptoms. 

## 2019-03-15 NOTE — Discharge Instructions (Addendum)
We tested you for covid We will call you if the test comes back positive  Follow up as needed for continued or worsening symptoms

## 2019-03-16 NOTE — ED Provider Notes (Signed)
[facility]    CSN: [id number] Arrival date & time: 03/15/19  1116      History   Chief Complaint Chief Complaint  Patient presents with  . Covid Testing    HPI Sherri Reid is a 44 y.o. female.   Patient is a 44 year old female presents today for Covid testing. Reporting she had exposure at work. She is currently asymptomatic. She would also like a refill on her blood pressure medication. Denies any dizziness, headache, weakness, blurred vision, chest pain or shortness of breath.  ROS per HPI      Past Medical History:  Diagnosis Date  . Asthma   . History of syphilis    treated  . Hypertension   . Vaginal Pap smear, abnormal    f/u ok    Patient Active Problem List   Diagnosis Date Noted  . Non-intractable vomiting   . AKI (acute kidney injury) ([place]) 01/03/2018  . Chronic anemia 01/03/2018  . Obesity, Class III, BMI 40-49.9 (morbid obesity) ([place])   . Leukocytosis   . Cellulitis of breast 01/02/2018    Past Surgical History:  Procedure Laterality Date  . NO PAST SURGERIES      OB History    Gravida  1   Para  0   Term  0   Preterm  0   AB  1   Living  0     SAB  1   TAB  0   Ectopic  0   Multiple  0   Live Births               Home Medications    Prior to Admission medications   Medication Sig Start Date End Date Taking? Authorizing Provider  valsartan-hydrochlorothiazide (DIOVAN-HCT) 160-25 MG tablet Take 1 tablet by mouth daily. 03/15/19   Orvan July, NP    Family History Family History  Problem Relation Age of Onset  . Hyperlipidemia Mother   . Mental illness Mother   . Hypertension Mother   . Diabetes Father   . Cancer Maternal Aunt   . Cancer Paternal Uncle   . Anesthesia problems Neg Hx     Social History Social History   Tobacco Use  . Smoking status: Current Every Day Smoker    Packs/day: 1.00    Years: 22.00    Pack years: 22.00    Types: Cigarettes  . Smokeless tobacco: Never Used   Substance Use Topics  . Alcohol use: Yes    Comment: occ  . Drug use: No     Allergies   Latex and Penicillins   Review of Systems Review of Systems   Physical Exam Triage Vital Signs ED Triage Vitals  Enc Vitals Group     BP 03/15/19 1158 (!) 147/71     Pulse Rate 03/15/19 1158 92     Resp 03/15/19 1158 18     Temp 03/15/19 1158 98.1 F (36.7 C)     Temp Source 03/15/19 1158 Oral     SpO2 03/15/19 1158 100 %     Weight --      Height --      Head Circumference --      Peak Flow --      Pain Score 03/15/19 1159 0     Pain Loc --      Pain Edu? --      Excl. in [place]? --    No data found.  Updated Vital Signs BP (!) 147/71 (  BP Location: Left Arm)   Pulse 92   Temp 98.1 F (36.7 C) (Oral)   Resp 18   SpO2 100%   Visual Acuity Right Eye Distance:   Left Eye Distance:   Bilateral Distance:    Right Eye Near:   Left Eye Near:    Bilateral Near:     Physical Exam Vitals and nursing note reviewed.  Constitutional:      General: She is not in acute distress.    Appearance: Normal appearance. She is not ill-appearing, toxic-appearing or diaphoretic.  HENT:     Head: Normocephalic.     Nose: Nose normal.  Eyes:     Conjunctiva/sclera: Conjunctivae normal.  Pulmonary:     Effort: Pulmonary effort is normal.  Musculoskeletal:        General: Normal range of motion.     Cervical back: Normal range of motion.  Skin:    General: Skin is warm and dry.     Findings: No rash.  Neurological:     Mental Status: She is alert.  Psychiatric:        Mood and Affect: Mood normal.      UC Treatments / Results  Labs (all labs ordered are listed, but only abnormal results are displayed) Labs Reviewed  NOVEL CORONAVIRUS, NAA (HOSP ORDER, SEND-OUT TO REF LAB; TAT 18-24 HRS)    EKG   Radiology No results found.  Procedures Procedures (including critical care time)  Medications Ordered in UC Medications - No data to display  Initial Impression /  Assessment and Plan / UC Course  I have reviewed the triage vital signs and the nursing notes.  Pertinent labs & imaging results that were available during my care of the patient were reviewed by me and considered in my medical decision making (see chart for details).     Covid testing-labs sent for testing. Patient asymptomatic.  Essential hypertension-refilled patient's blood pressure medication as requested. Final Clinical Impressions(s) / UC Diagnoses   Final diagnoses:  Encounter for laboratory testing for COVID-19 virus  Essential hypertension     Discharge Instructions     We tested you for covid We will call you if the test comes back positive  Follow up as needed for continued or worsening symptoms     ED Prescriptions    Medication Sig Dispense Auth. Provider   valsartan-hydrochlorothiazide (DIOVAN-HCT) 160-25 MG tablet Take 1 tablet by mouth daily. 30 tablet Dahlia Byes A, NP     PDMP not reviewed this encounter.   Janace Aris, NP 03/16/19 1332

## 2019-03-17 LAB — NOVEL CORONAVIRUS, NAA (HOSP ORDER, SEND-OUT TO REF LAB; TAT 18-24 HRS): SARS-CoV-2, NAA: NOT DETECTED

## 2019-06-09 ENCOUNTER — Emergency Department (HOSPITAL_BASED_OUTPATIENT_CLINIC_OR_DEPARTMENT_OTHER)
Admission: EM | Admit: 2019-06-09 | Discharge: 2019-06-09 | Disposition: A | Discharge status: Home / Self Care | Payer: 59 | Attending: Emergency Medicine | Admitting: Emergency Medicine

## 2019-06-09 ENCOUNTER — Encounter (HOSPITAL_BASED_OUTPATIENT_CLINIC_OR_DEPARTMENT_OTHER): Payer: Self-pay | Admitting: *Deleted

## 2019-06-09 ENCOUNTER — Other Ambulatory Visit: Payer: Self-pay

## 2019-06-09 ENCOUNTER — Emergency Department (HOSPITAL_BASED_OUTPATIENT_CLINIC_OR_DEPARTMENT_OTHER): Payer: 59

## 2019-06-09 DIAGNOSIS — W010XXA Fall on same level from slipping, tripping and stumbling without subsequent striking against object, initial encounter: Secondary | ICD-10-CM | POA: Insufficient documentation

## 2019-06-09 DIAGNOSIS — Z79899 Other long term (current) drug therapy: Secondary | ICD-10-CM | POA: Diagnosis not present

## 2019-06-09 DIAGNOSIS — M25532 Pain in left wrist: Secondary | ICD-10-CM | POA: Diagnosis not present

## 2019-06-09 DIAGNOSIS — R519 Headache, unspecified: Secondary | ICD-10-CM | POA: Insufficient documentation

## 2019-06-09 DIAGNOSIS — Y9289 Other specified places as the place of occurrence of the external cause: Secondary | ICD-10-CM | POA: Diagnosis not present

## 2019-06-09 DIAGNOSIS — I10 Essential (primary) hypertension: Secondary | ICD-10-CM | POA: Insufficient documentation

## 2019-06-09 DIAGNOSIS — Y9389 Activity, other specified: Secondary | ICD-10-CM | POA: Diagnosis not present

## 2019-06-09 DIAGNOSIS — Y990 Civilian activity done for income or pay: Secondary | ICD-10-CM | POA: Diagnosis not present

## 2019-06-09 DIAGNOSIS — Z91040 Latex allergy status: Secondary | ICD-10-CM | POA: Insufficient documentation

## 2019-06-09 DIAGNOSIS — F17210 Nicotine dependence, cigarettes, uncomplicated: Secondary | ICD-10-CM | POA: Insufficient documentation

## 2019-06-09 LAB — PREGNANCY, URINE: Preg Test, Ur: NEGATIVE

## 2019-06-09 MED ORDER — PROCHLORPERAZINE EDISYLATE 10 MG/2ML IJ SOLN
10.0000 mg | Freq: Once | INTRAMUSCULAR | Status: AC
  Administered 2019-06-09: 10 mg via INTRAVENOUS
  Filled 2019-06-09: qty 2

## 2019-06-09 MED ORDER — SODIUM CHLORIDE 0.9 % IV BOLUS
1000.0000 mL | Freq: Once | INTRAVENOUS | Status: AC
  Administered 2019-06-09: 1000 mL via INTRAVENOUS

## 2019-06-09 MED ORDER — VALSARTAN-HYDROCHLOROTHIAZIDE 160-25 MG PO TABS: 1.0000 | ORAL_TABLET | Freq: Every day | ORAL | 1 refills | Status: DC

## 2019-06-09 MED ORDER — DIPHENHYDRAMINE HCL 50 MG/ML IJ SOLN
25.0000 mg | Freq: Once | INTRAMUSCULAR | Status: AC
  Administered 2019-06-09: 25 mg via INTRAVENOUS
  Filled 2019-06-09: qty 1

## 2019-06-09 NOTE — ED Triage Notes (Signed)
Pt co h/a x 3 days also c/o fall today with left wrist injury

## 2019-06-09 NOTE — ED Provider Notes (Signed)
[facility] EMERGENCY DEPARTMENT Provider Note   CSN: [id number] Arrival date & time: 06/09/19  1653     History Chief Complaint  Patient presents with  . Headache    Sherri Reid is a 44 y.o. female with past medical history significant for asthma, hypertension [place] to emergency department today with chief complaint of headache x3 days.  Patient states her headache has progressively worsened since onset.  She states it feels like headache she has had in the past.  She states the pain is located in the left side of her head and feels like a throbbing sensation.  She has tried taking Tylenol and ibuprofen without any symptom relief.  Patient states that she ran out of her blood pressure medicine x 4 days ago.  She has been closely monitoring her blood pressure and states it has been within normal range and not elevated at home.  She recently changed jobs and got new insurance so she is currently attempting to establish care with a PCP.  She is also reporting a mechanical fall today.  She states she slipped at work and fell landing on her left wrist.  She has an aching pain that radiates up her arm.  She rates the pain 8 out of 10 in severity. She denies hitting her head or loss of consciousness. Denies fever, syncope, head trauma, photophobia, phonophobia, UL throbbing, N/V, visual changes, stiff neck, neck pain, rash, or "thunderclap" onset.    Past Medical History:  Diagnosis Date  . Asthma   . History of syphilis    treated  . Hypertension   . Vaginal Pap smear, abnormal    f/u ok    Patient Active Problem List   Diagnosis Date Noted  . Non-intractable vomiting   . AKI (acute kidney injury) ([place]) 01/03/2018  . Chronic anemia 01/03/2018  . Obesity, Class III, BMI 40-49.9 (morbid obesity) ([place])   . Leukocytosis   . Cellulitis of breast 01/02/2018    Past Surgical History:  Procedure Laterality Date  . NO PAST SURGERIES       OB History    Gravida  1   Para    0   Term  0   Preterm  0   AB  1   Living  0     SAB  1   TAB  0   Ectopic  0   Multiple  0   Live Births              Family History  Problem Relation Age of Onset  . Hyperlipidemia Mother   . Mental illness Mother   . Hypertension Mother   . Diabetes Father   . Cancer Maternal Aunt   . Cancer Paternal Uncle   . Anesthesia problems Neg Hx     Social History   Tobacco Use  . Smoking status: Current Every Day Smoker    Packs/day: 1.00    Years: 22.00    Pack years: 22.00    Types: Cigarettes  . Smokeless tobacco: Never Used  Substance Use Topics  . Alcohol use: Yes    Comment: occ  . Drug use: No    Home Medications Prior to Admission medications   Medication Sig Start Date End Date Taking? Authorizing Provider  valsartan-hydrochlorothiazide (DIOVAN-HCT) 160-25 MG tablet Take 1 tablet by mouth daily. 06/09/19   Milen Lengacher E, PA-C    Allergies    Latex and Penicillins  Review of Systems   Review of  Systems All other systems are reviewed and are negative for acute change except as noted in the HPI.  Physical Exam Updated Vital Signs BP (!) 156/88   Pulse 89   Temp 98.2 F (36.8 C) (Oral)   Resp 16   Ht 5\' 2"  (1.575 m)   Wt 122.7 kg   SpO2 99%   BMI 49.48 kg/m   Physical Exam Vitals and nursing note reviewed.  Constitutional:      General: She is not in acute distress.    Appearance: She is not ill-appearing.  HENT:     Head: Normocephalic and atraumatic.     Comments: No sinus or temporal tenderness.    Right Ear: Tympanic membrane and external ear normal.     Left Ear: Tympanic membrane and external ear normal.     Nose: Nose normal.     Mouth/Throat:     Mouth: Mucous membranes are moist.     Pharynx: Oropharynx is clear.  Eyes:     General: No scleral icterus.       Right eye: No discharge.        Left eye: No discharge.     Extraocular Movements: Extraocular movements intact.     Conjunctiva/sclera: Conjunctivae  normal.     Pupils: Pupils are equal, round, and reactive to light.  Neck:     Vascular: No JVD.  Cardiovascular:     Rate and Rhythm: Normal rate and regular rhythm.     Pulses: Normal pulses.          Radial pulses are 2+ on the right side and 2+ on the left side.     Heart sounds: Normal heart sounds.  Pulmonary:     Comments: Lungs clear to auscultation in all fields. Symmetric chest rise. No wheezing, rales, or rhonchi. Abdominal:     Comments: Abdomen is soft, non-distended, and non-tender in all quadrants. No rigidity, no guarding. No peritoneal signs.  Musculoskeletal:        General: Normal range of motion.     Cervical back: Normal range of motion.     Comments: Left wrist without tenderness to palpation on exam. No swelling. There is no joint effusion noted. Full ROM without pain. No erythema or warmth overlaying the joint. There is no anatomic snuff box tenderness. Normal sensation and motor function in the median, ulnar, and radial nerve distributions. 2+ radial pulse.  Full ROM of left elbow and shoulder.  Skin:    General: Skin is warm and dry.     Capillary Refill: Capillary refill takes less than 2 seconds.  Neurological:     Mental Status: She is oriented to person, place, and time.     GCS: GCS eye subscore is 4. GCS verbal subscore is 5. GCS motor subscore is 6.     Comments: Mental Status:  Alert, oriented, thought content appropriate, able to give a coherent history. Speech fluent without evidence of aphasia. Able to follow 2 step commands without difficulty.  Cranial Nerves:  II:  Peripheral visual fields grossly normal, pupils equal, round, reactive to light III,IV, VI: ptosis not present, extra-ocular motions intact bilaterally  V,VII: smile symmetric, facial light touch sensation equal VIII: hearing grossly normal to voice  X: uvula elevates symmetrically  XI: bilateral shoulder shrug symmetric and strong XII: midline tongue extension without  fassiculations Motor:  Normal tone. 5/5 in upper and lower extremities bilaterally including strong and equal grip strength and dorsiflexion/plantar flexion Sensory: Pinprick and light touch  normal in all extremities.  Deep Tendon Reflexes: 2+ and symmetric in the biceps and patella Cerebellar: normal finger-to-nose with bilateral upper extremities Gait: normal gait and balance CV: distal pulses palpable throughout     Psychiatric:        Behavior: Behavior normal.     ED Results / Procedures / Treatments   Labs (all labs ordered are listed, but only abnormal results are displayed) Labs Reviewed  PREGNANCY, URINE    EKG None  Radiology DG Wrist Complete Left  Result Date: 06/09/2019 CLINICAL DATA:  Fall, left wrist injury EXAM: LEFT WRIST - COMPLETE 3+ VIEW COMPARISON:  None. FINDINGS: No acute bony abnormality. Specifically, no fracture, subluxation, or dislocation. Joint spaces maintained. IMPRESSION: Negative. Electronically Signed   By: Charlett Nose M.D.   On: 06/09/2019 17:37    Procedures Procedures (including critical care time)  Medications Ordered in ED Medications  sodium chloride 0.9 % bolus 1,000 mL (0 mLs Intravenous Stopped 06/09/19 2227)  prochlorperazine (COMPAZINE) injection 10 mg (10 mg Intravenous Given 06/09/19 2044)  diphenhydrAMINE (BENADRYL) injection 25 mg (25 mg Intravenous Given 06/09/19 2043)    ED Course  I have reviewed the triage vital signs and the nursing notes.  Pertinent labs & imaging results that were available during my care of the patient were reviewed by me and considered in my medical decision making (see chart for details).    MDM Rules/Calculators/A&P                     History provided by patient with additional history obtained from chart review.    44 year old female presents with headache and left wrist pain.  On exam she is well-appearing, no acute distress. On exam she has no tenderness palpation of left wrist.  No obvious  deformity or open wounds.  No anatomic snuffbox tenderness.  Range of motion is intact.  She is neurovascularly intact distally.  X-ray viewed by me is negative for any fracture dislocation. . Pt HA treated and improved while in ED.  Presentation is like pts typical HA and non concerning for [facility], ICH, Meningitis, or temporal arteritis. Pt is afebrile with no focal neuro deficits, nuchal rigidity, or change in vision.  Pregnancy test is negative I discussed results, treatment plan, need for follow-up, and return precautions with the patient. Provided opportunity for questions, patient confirmed understanding and are in agreement with plan.    Portions of this note were generated with Scientist, clinical (histocompatibility and immunogenetics). Dictation errors may occur despite best attempts at proofreading.   Final Clinical Impression(s) / ED Diagnoses Final diagnoses:  Acute nonintractable headache, unspecified headache type  Left wrist pain    Rx / DC Orders ED Discharge Orders         Ordered    valsartan-hydrochlorothiazide (DIOVAN-HCT) 160-25 MG tablet  Daily     06/09/19 2211           Sherene Sires, PA-C 06/09/19 2247    Rolan Bucco, MD 06/10/19 1511

## 2019-06-09 NOTE — Discharge Instructions (Signed)
You have been seen today for headache and wrist pain. Please read and follow all provided instructions. Return to the emergency room for worsening condition or new concerning symptoms.    The x-ray of your wrist did not show any broken bones.  Likely have a sprain.  1. Medications:  -Prescription sent to your pharmacy to refill your blood pressure medicine.  Please take as prescribed.  -Recommend you take Tylenol ibuprofen for pain in your wrist.  You can apply ice or heat if that helps as well.  Continue usual home medications Take medications as prescribed. Please review all of the medicines and only take them if you do not have an allergy to them.   2. Treatment: rest, drink plenty of fluids  3. Follow Up:  Please follow up with primary care provider by scheduling an appointment as soon as possible for a visit  If you do not have a primary care physician, contact [facility] at [phone] for referral   It is also a possibility that you have an allergic reaction to any of the medicines that you have been prescribed - Everybody reacts differently to medications and while MOST people have no trouble with most medicines, you may have a reaction such as nausea, vomiting, rash, swelling, shortness of breath. If this is the case, please stop taking the medicine immediately and contact your physician.  ?

## 2019-07-14 ENCOUNTER — Emergency Department (HOSPITAL_BASED_OUTPATIENT_CLINIC_OR_DEPARTMENT_OTHER): Payer: 59

## 2019-07-14 ENCOUNTER — Encounter (HOSPITAL_BASED_OUTPATIENT_CLINIC_OR_DEPARTMENT_OTHER): Payer: Self-pay | Admitting: Emergency Medicine

## 2019-07-14 ENCOUNTER — Other Ambulatory Visit: Payer: Self-pay

## 2019-07-14 ENCOUNTER — Emergency Department (HOSPITAL_BASED_OUTPATIENT_CLINIC_OR_DEPARTMENT_OTHER)
Admission: EM | Admit: 2019-07-14 | Discharge: 2019-07-14 | Disposition: A | Discharge status: Home / Self Care | Payer: 59 | Attending: Emergency Medicine | Admitting: Emergency Medicine

## 2019-07-14 DIAGNOSIS — I10 Essential (primary) hypertension: Secondary | ICD-10-CM | POA: Diagnosis not present

## 2019-07-14 DIAGNOSIS — Z20822 Contact with and (suspected) exposure to covid-19: Secondary | ICD-10-CM | POA: Diagnosis not present

## 2019-07-14 DIAGNOSIS — Z79899 Other long term (current) drug therapy: Secondary | ICD-10-CM | POA: Insufficient documentation

## 2019-07-14 DIAGNOSIS — F17210 Nicotine dependence, cigarettes, uncomplicated: Secondary | ICD-10-CM | POA: Insufficient documentation

## 2019-07-14 DIAGNOSIS — Z91040 Latex allergy status: Secondary | ICD-10-CM | POA: Diagnosis not present

## 2019-07-14 DIAGNOSIS — J45909 Unspecified asthma, uncomplicated: Secondary | ICD-10-CM | POA: Diagnosis not present

## 2019-07-14 DIAGNOSIS — R5383 Other fatigue: Secondary | ICD-10-CM | POA: Diagnosis not present

## 2019-07-14 DIAGNOSIS — R519 Headache, unspecified: Secondary | ICD-10-CM | POA: Diagnosis not present

## 2019-07-14 DIAGNOSIS — R109 Unspecified abdominal pain: Secondary | ICD-10-CM | POA: Diagnosis present

## 2019-07-14 DIAGNOSIS — K529 Noninfective gastroenteritis and colitis, unspecified: Secondary | ICD-10-CM

## 2019-07-14 LAB — CBC WITH DIFFERENTIAL/PLATELET
Abs Immature Granulocytes: 0.03 10*3/uL (ref 0.00–0.07)
Basophils Absolute: 0 10*3/uL (ref 0.0–0.1)
Basophils Relative: 0 %
Eosinophils Absolute: 0.1 10*3/uL (ref 0.0–0.5)
Eosinophils Relative: 1 %
HCT: 37.6 % (ref 36.0–46.0)
Hemoglobin: 11.9 g/dL — ABNORMAL LOW (ref 12.0–15.0)
Immature Granulocytes: 0 %
Lymphocytes Relative: 37 %
Lymphs Abs: 3 10*3/uL (ref 0.7–4.0)
MCH: 26.6 pg (ref 26.0–34.0)
MCHC: 31.6 g/dL (ref 30.0–36.0)
MCV: 83.9 fL (ref 80.0–100.0)
Monocytes Absolute: 0.5 10*3/uL (ref 0.1–1.0)
Monocytes Relative: 6 %
Neutro Abs: 4.5 10*3/uL (ref 1.7–7.7)
Neutrophils Relative %: 56 %
Platelets: 299 10*3/uL (ref 150–400)
RBC: 4.48 MIL/uL (ref 3.87–5.11)
RDW: 13.6 % (ref 11.5–15.5)
WBC: 8 10*3/uL (ref 4.0–10.5)
nRBC: 0 % (ref 0.0–0.2)

## 2019-07-14 LAB — COMPREHENSIVE METABOLIC PANEL
ALT: 14 U/L (ref 0–44)
AST: 16 U/L (ref 15–41)
Albumin: 3.6 g/dL (ref 3.5–5.0)
Alkaline Phosphatase: 56 U/L (ref 38–126)
Anion gap: 9 (ref 5–15)
BUN: 13 mg/dL (ref 6–20)
CO2: 25 mmol/L (ref 22–32)
Calcium: 8.6 mg/dL — ABNORMAL LOW (ref 8.9–10.3)
Chloride: 102 mmol/L (ref 98–111)
Creatinine, Ser: 0.92 mg/dL (ref 0.44–1.00)
GFR calc Af Amer: >60 mL/min (ref >60)
GFR calc non Af Amer: >60 mL/min (ref >60)
Glucose, Bld: 95 mg/dL (ref 70–99)
Potassium: 4.3 mmol/L (ref 3.5–5.1)
Sodium: 136 mmol/L (ref 135–145)
Total Bilirubin: 0.7 mg/dL (ref 0.3–1.2)
Total Protein: 7.5 g/dL (ref 6.5–8.1)

## 2019-07-14 LAB — LIPASE, BLOOD: Lipase: 30 U/L (ref 11–51)

## 2019-07-14 LAB — PREGNANCY, URINE: Preg Test, Ur: NEGATIVE

## 2019-07-14 LAB — URINALYSIS, ROUTINE W REFLEX MICROSCOPIC
Bilirubin Urine: NEGATIVE
Glucose, UA: NEGATIVE mg/dL
Hgb urine dipstick: NEGATIVE
Ketones, ur: NEGATIVE mg/dL
Leukocytes,Ua: NEGATIVE
Nitrite: NEGATIVE
Protein, ur: NEGATIVE mg/dL
Specific Gravity, Urine: 1.025 (ref 1.005–1.030)
pH: 5.5 (ref 5.0–8.0)

## 2019-07-14 LAB — SARS CORONAVIRUS 2 BY RT PCR (HOSPITAL ORDER, PERFORMED IN ~~LOC~~ HOSPITAL LAB): SARS Coronavirus 2: NEGATIVE

## 2019-07-14 MED ORDER — DICYCLOMINE HCL 10 MG/ML IM SOLN
20.0000 mg | Freq: Once | INTRAMUSCULAR | Status: AC
  Administered 2019-07-14: 20 mg via INTRAMUSCULAR
  Filled 2019-07-14: qty 2

## 2019-07-14 MED ORDER — IOHEXOL 300 MG/ML  SOLN
100.0000 mL | Freq: Once | INTRAMUSCULAR | Status: AC | PRN
  Administered 2019-07-14: 100 mL via INTRAVENOUS

## 2019-07-14 MED ORDER — SODIUM CHLORIDE 0.9 % IV BOLUS
1000.0000 mL | Freq: Once | INTRAVENOUS | Status: AC
  Administered 2019-07-14: 1000 mL via INTRAVENOUS

## 2019-07-14 MED ORDER — ONDANSETRON HCL 4 MG/2ML IJ SOLN
4.0000 mg | Freq: Once | INTRAMUSCULAR | Status: AC
  Administered 2019-07-14: 4 mg via INTRAVENOUS
  Filled 2019-07-14: qty 2

## 2019-07-14 NOTE — Discharge Instructions (Addendum)
Your work-up today was reassuring. You tested NEGATIVE for coronavirus. We suspect you have viral gastroenteritis. We encourage you to stay hydrated. Return to the ED if your condition worsens acutely or does not resolve within a few days.

## 2019-07-14 NOTE — ED Notes (Signed)
Patient transported to CT 

## 2019-07-14 NOTE — ED Notes (Signed)
ED Provider at bedside. 

## 2019-07-14 NOTE — ED Provider Notes (Signed)
[facility] EMERGENCY DEPARTMENT Provider Note   CSN: [id number] Arrival date & time: 07/14/19  0915     History Chief Complaint  Patient presents with  . Diarrhea    Sherri Reid is a 44 y.o. woman with history of HTN, asthma, and chronic headaches who presents to the ED with a two-day history of diarrhea, nausea, and abdominal pain.  Patient reports two nights ago she woke up sweating and with crampy lower abdominal pain. She then had several episodes of diarrhea that night and throughout the day yesterday. She decided to come to the ED today because while at work today she began to feel weaker and continued to have more episodes of diarrhea. She reports three of her co-workers have similar symptoms. They work in the same vicinity, though have not shared the same food. She states the abdominal pain "comes and goes" and "shoots across her abdomen," and is currently a 5/10 in intensity. States she has some associated nausea and no vomiting but has been able to eat and drink normally. Had a mild HA yesterday which was alleviated with ibuprofen but otherwise denies fever/chills, CP, SOB, nasal congestion, rhinorrhea, urinary symptoms, blood in the stool, rashes.   States she has been adhering to [employer] in public and keeping distance. Has not received a COVID vaccine.    Past Medical History:  Diagnosis Date  . Asthma   . History of syphilis    treated  . Hypertension   . Vaginal Pap smear, abnormal    f/u ok    Patient Active Problem List   Diagnosis Date Noted  . Non-intractable vomiting   . AKI (acute kidney injury) (HCC) 01/03/2018  . Chronic anemia 01/03/2018  . Obesity, Class III, BMI 40-49.9 (morbid obesity) (HCC)   . Leukocytosis   . Cellulitis of breast 01/02/2018    Past Surgical History:  Procedure Laterality Date  . NO PAST SURGERIES       OB History    Gravida  1   Para  0   Term  0   Preterm  0   AB  1   Living  0     SAB  1    TAB  0   Ectopic  0   Multiple  0   Live Births              Family History  Problem Relation Age of Onset  . Hyperlipidemia Mother   . Mental illness Mother   . Hypertension Mother   . Diabetes Father   . Cancer Maternal Aunt   . Cancer Paternal Uncle   . Anesthesia problems Neg Hx     Social History   Tobacco Use  . Smoking status: Current Every Day Smoker    Packs/day: 1.00    Years: 22.00    Pack years: 22.00    Types: Cigarettes  . Smokeless tobacco: Never Used  Vaping Use  . Vaping Use: Never used  Substance Use Topics  . Alcohol use: Yes    Comment: occ  . Drug use: No    Home Medications Prior to Admission medications   Medication Sig Start Date End Date Taking? Authorizing Provider  valsartan-hydrochlorothiazide (DIOVAN-HCT) 160-25 MG tablet Take 1 tablet by mouth daily. 06/09/19   Albrizze, Kaitlyn E, PA-C    Allergies    Latex and Penicillins  Review of Systems   Review of Systems  Constitutional: Positive for fatigue. Negative for appetite change and chills.  HENT:  Negative for congestion and rhinorrhea.   Respiratory: Negative for chest tightness and shortness of breath.   Cardiovascular: Negative for chest pain.  Gastrointestinal: Positive for abdominal pain, diarrhea and nausea. Negative for blood in stool and vomiting.  Genitourinary: Negative for difficulty urinating and dysuria.  Musculoskeletal: Negative for myalgias.  Skin: Negative for rash.  Neurological: Positive for headaches. Negative for dizziness and weakness.    Physical Exam Updated Vital Signs BP (!) 155/92 (BP Location: Right Arm)   Pulse 70   Temp 98.1 F (36.7 C) (Oral)   Resp 16   Ht 5\' 2"  (1.575 m)   Wt 122.5 kg   LMP 07/03/2019 Comment: neg upreg today in er. rr  SpO2 100%   BMI 49.38 kg/m   Physical Exam Constitutional:      Appearance: She is obese.     Comments: Tired- and uncomforable-appearing  HENT:     Head: Normocephalic and atraumatic.      Mouth/Throat:     Mouth: Mucous membranes are moist.  Eyes:     Conjunctiva/sclera: Conjunctivae normal.     Pupils: Pupils are equal, round, and reactive to light.  Cardiovascular:     Rate and Rhythm: Normal rate and regular rhythm.     Pulses: Normal pulses.     Heart sounds: Normal heart sounds.  Pulmonary:     Effort: Pulmonary effort is normal.     Breath sounds: Normal breath sounds.  Abdominal:     General: There is no distension.     Palpations: Abdomen is soft. There is no mass.     Tenderness: There is abdominal tenderness in the left lower quadrant. There is guarding. There is no rebound.  Skin:    General: Skin is warm and dry.  Neurological:     Mental Status: She is oriented to person, place, and time.     ED Results / Procedures / Treatments   Labs (all labs ordered are listed, but only abnormal results are displayed) Labs Reviewed  CBC WITH DIFFERENTIAL/PLATELET - Abnormal; Notable for the following components:      Result Value   Hemoglobin 11.9 (*)    All other components within normal limits  COMPREHENSIVE METABOLIC PANEL - Abnormal; Notable for the following components:   Calcium 8.6 (*)    All other components within normal limits  SARS CORONAVIRUS 2 BY RT PCR (HOSPITAL ORDER, PERFORMED IN [facility])  URINALYSIS, ROUTINE W REFLEX MICROSCOPIC  PREGNANCY, URINE  LIPASE, BLOOD    EKG None  Radiology CT ABDOMEN PELVIS W CONTRAST  Result Date: 07/14/2019 CLINICAL DATA:  Nausea and diarrhea with left-sided abdominal pain. EXAM: CT ABDOMEN AND PELVIS WITH CONTRAST TECHNIQUE: Multidetector CT imaging of the abdomen and pelvis was performed using the standard protocol following bolus administration of intravenous contrast. CONTRAST:  09/14/2019 OMNIPAQUE IOHEXOL 300 MG/ML  SOLN COMPARISON:  06/26/2017 FINDINGS: Lower chest: Unremarkable Hepatobiliary: Unremarkable Pancreas: Unremarkable Spleen: Unremarkable Adrenals/Urinary Tract: Unremarkable  Stomach/Bowel: No dilated bowel or abnormal bowel wall thickening. Normal appendix. Formed stool in the distal colon and rectum without visible air-fluid levels at this time. The terminal ileum appears unremarkable. Vascular/Lymphatic: 0.8 cm left external iliac lymph node and 0.9 cm right external iliac lymph node, upper normal size without overt pathologic adenopathy. Reproductive: Unremarkable Other: No supplemental non-categorized findings. Musculoskeletal: Widened facet joints at L4-5 bilaterally probably reflecting facet joint effusions. In conjunction with facet spurring there is bilateral foraminal impingement at this level. There is also likely bilateral  foraminal impingement at L5-S1 due to spurring. These findings are not appreciably changed from 2019. IMPRESSION: 1. A specific cause for the patient's left-sided abdominal pain is not identified. 2. Widened facet joints at L4-5 bilaterally probably reflecting facet joint effusions. In conjunction with facet spurring there is bilateral foraminal impingement at L4-5 and L5-S1 due to spurring. These findings are not appreciably changed from 2019. Electronically Signed   By: Gaylyn Rong M.D.   On: 07/14/2019 13:07    Medications Ordered in ED Medications  sodium chloride 0.9 % bolus 1,000 mL (0 mLs Intravenous Stopped 07/14/19 1201)  ondansetron (ZOFRAN) injection 4 mg (4 mg Intravenous Given 07/14/19 1047)  dicyclomine (BENTYL) injection 20 mg (20 mg Intramuscular Given 07/14/19 1039)  iohexol (OMNIPAQUE) 300 MG/ML solution 100 mL (100 mLs Intravenous Contrast Given 07/14/19 1231)    ED Course  I have reviewed the triage vital signs and the nursing notes.  Pertinent labs & imaging results that were available during my care of the patient were reviewed by me and considered in my medical decision making (see chart for details).    MDM Rules/Calculators/A&P                          Britley Gashi is a 44 y.o. woman with history of HTN,  asthma, and chronic headaches who presents to the ED with a two-day history of diarrhea, nausea, and abdominal pain.  Patient is non-toxic appearing with stable vitals. Given the patient's reported sick contacts, presentation most likely viral gastroenteritis. UTI also on the differential though patient without urinary symptoms. Given tenderness in the LLQ, diverticulitis is also on the differential. Will obtain CBC, CMP, lipase, UPreg, urinalysis, and coronavirus PCR. Will treat with IVF, Zofran, and dicycloverine. Will also obtain CT abdomen pelvis with contrast.   11:25am CBC significant for slight anemia (Hgb 11.9). CMP unremarkable. Lipase wnl. Urinalysis unremarkable.  12:15pm Coronavirus test negative.  1pm CT abdomen pelvis unremarkable, no explanation for her abdominal pain identified. Given aforementioned work-up, patient's presentation most consistent with viral gastroenteritis. Patient discharged home with return precautions.  Final Clinical Impression(s) / ED Diagnoses Final diagnoses:  Gastroenteritis    Rx / DC Orders ED Discharge Orders    None       Alphonzo Severance, MD 07/14/19 1335    Alvira Monday, MD 07/14/19 2315

## 2019-07-14 NOTE — ED Triage Notes (Signed)
Diarrhea and nausea since yesterday.

## 2019-09-13 IMAGING — US US BREAST*R* LIMITED INC AXILLA
1 series · 14 of 24 positions shown · non-contrast
Comparison: None.

CLINICAL DATA: 42-year-old female with a history of cellulitis

EXAM:
ULTRASOUND RIGHT CHEST
TECHNIQUE: Ultrasound examination was performed of the right chest to evaluate
for possible abscess.

[Series 1: us breast*right* limited inc axilla · 14 of 24 slices shown]
[im 1/24]
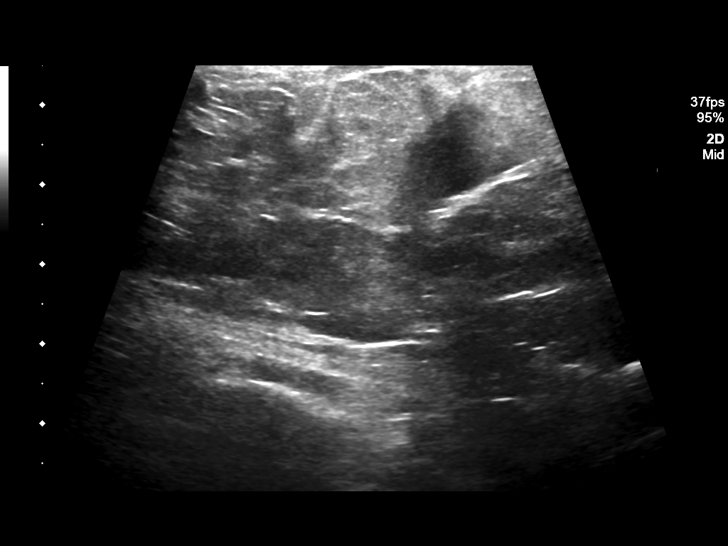
[im 3/24]
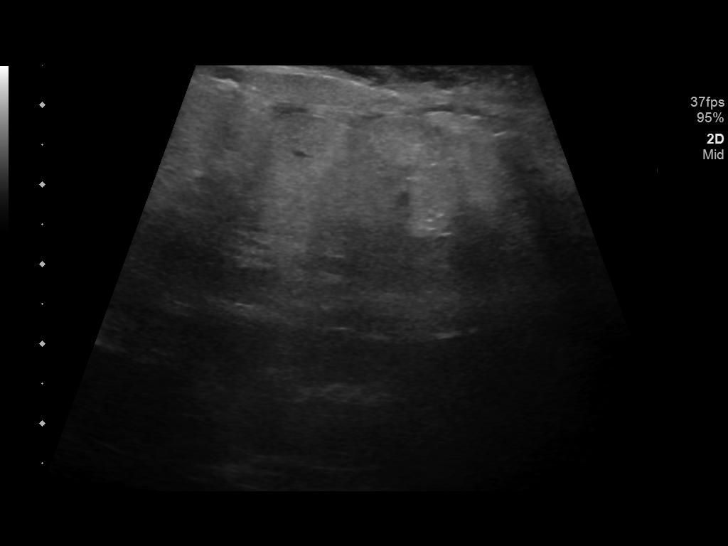
[im 5/24]
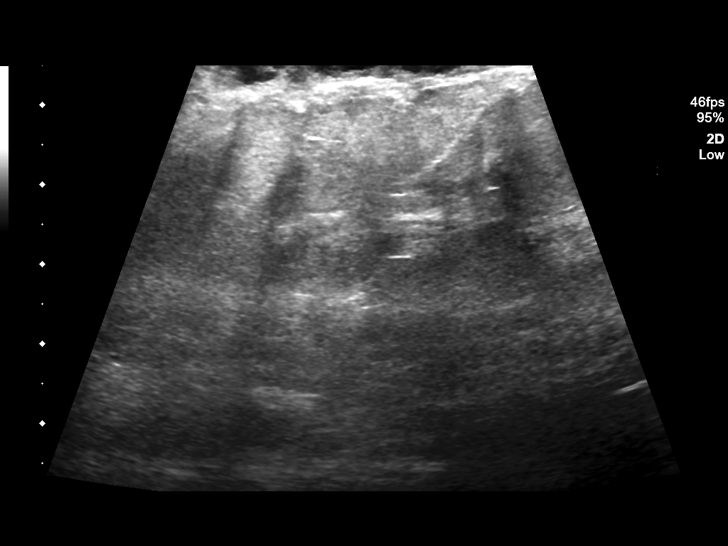
[im 7/24]
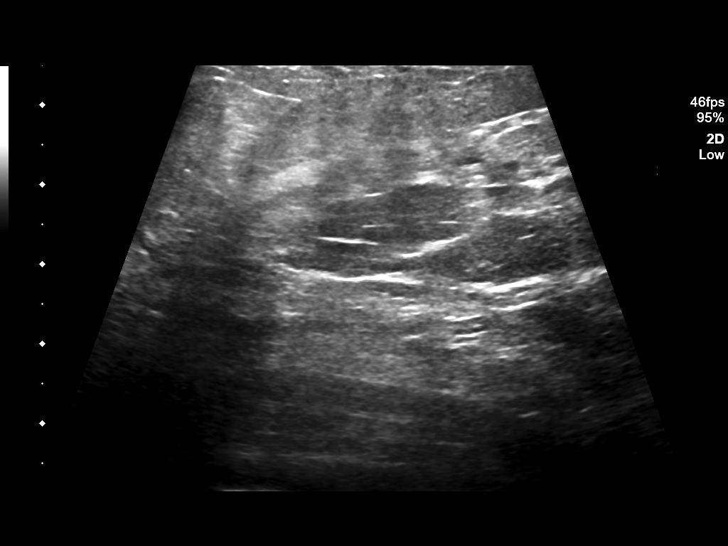
[im 8/24]
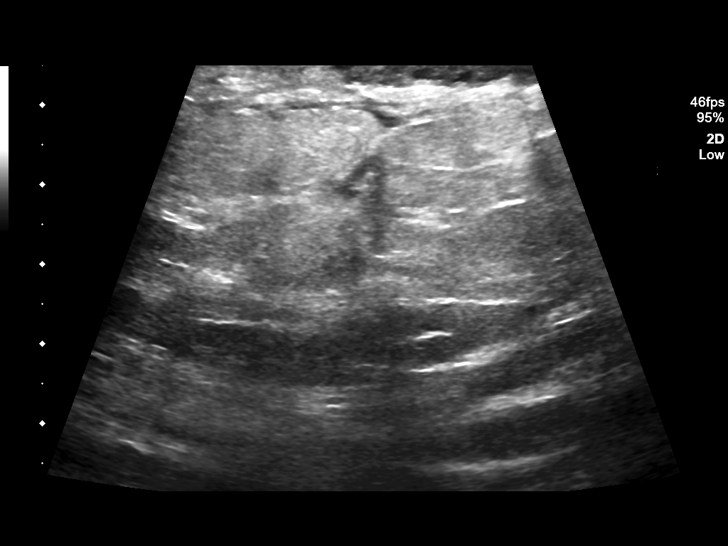
[im 10/24]
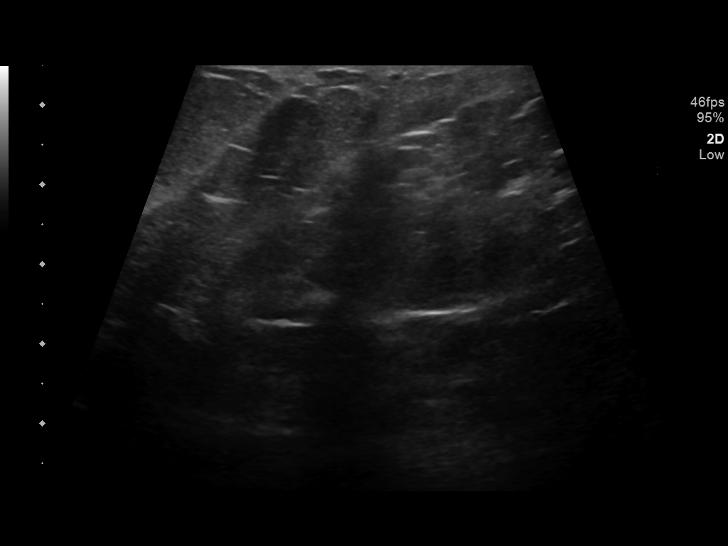
[im 12/24]
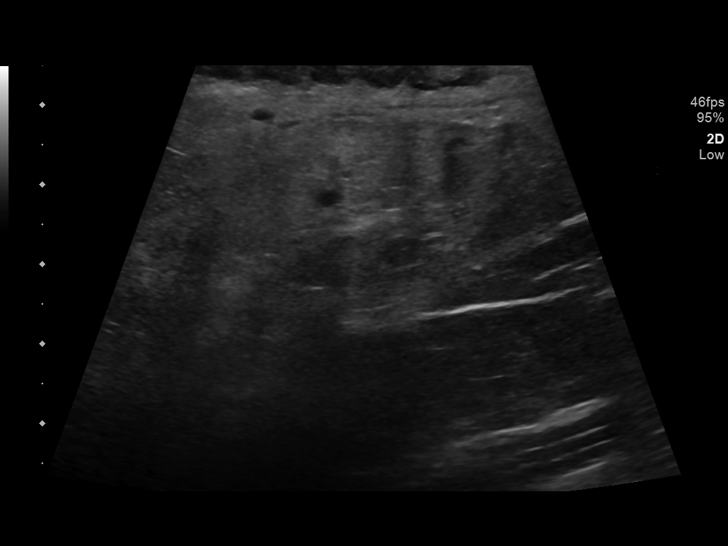
[im 13/24]
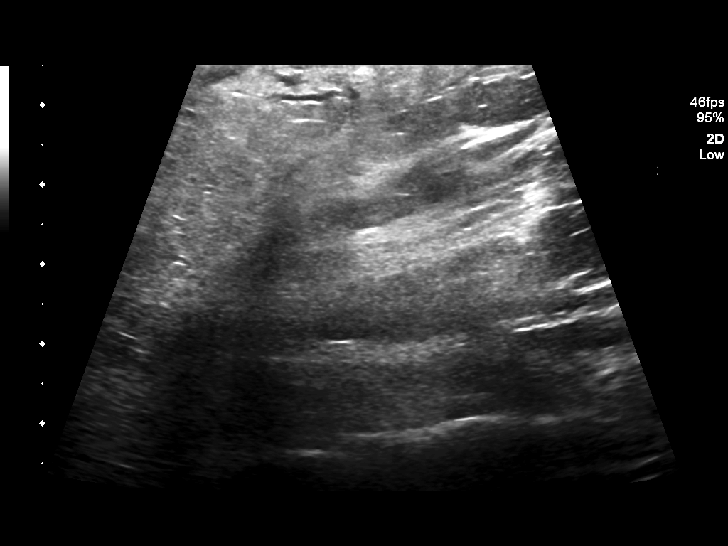
[im 15/24]
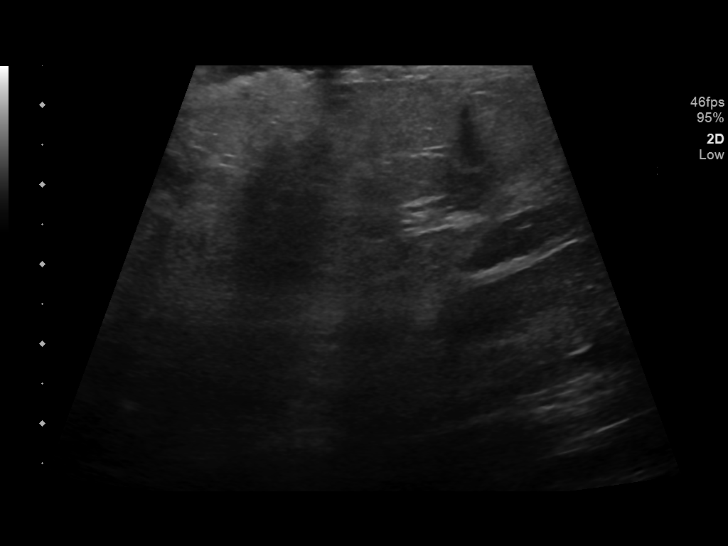
[im 17/24]
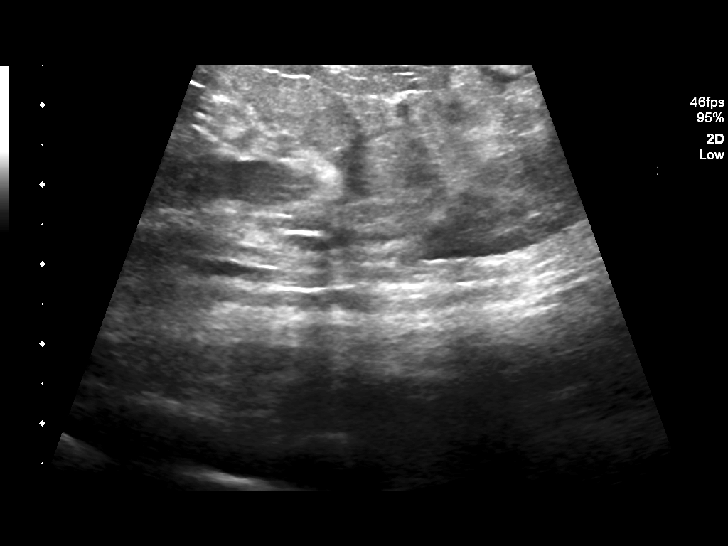
[im 19/24]
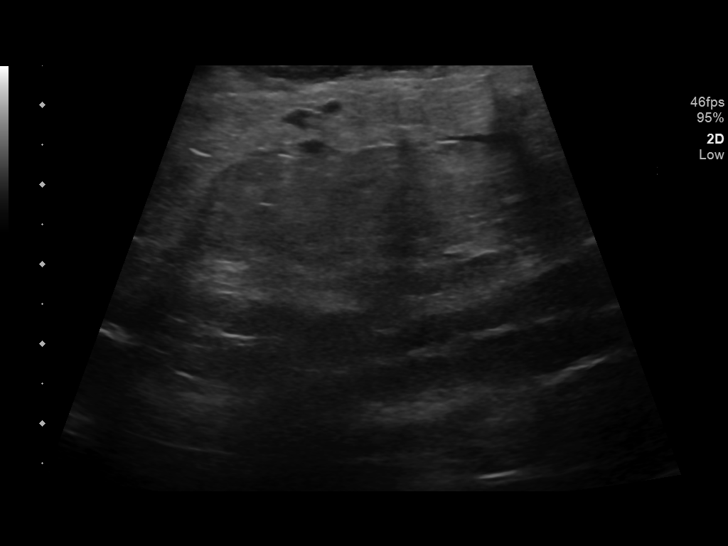
[im 20/24]
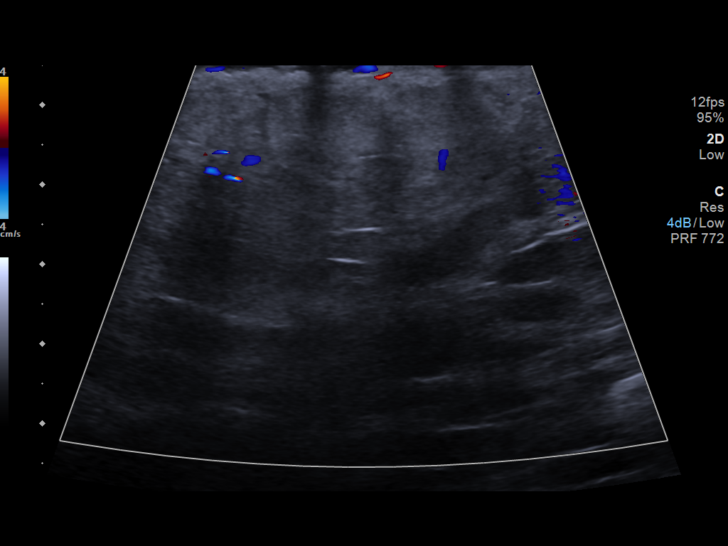
[im 22/24]
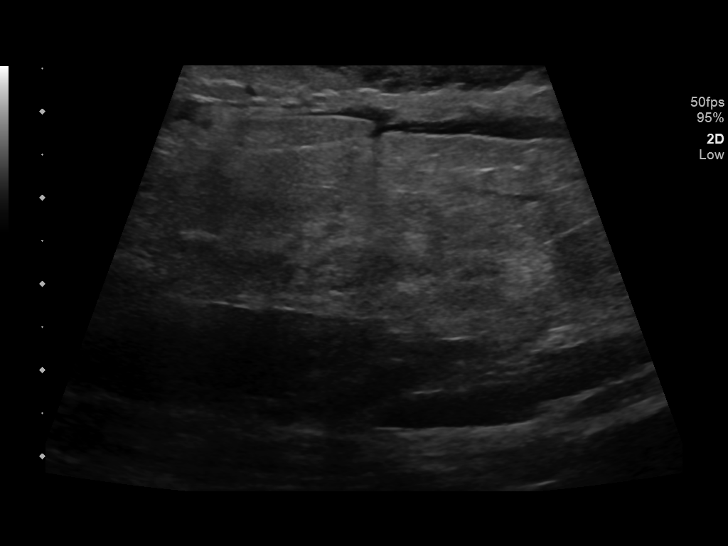
[im 24/24]
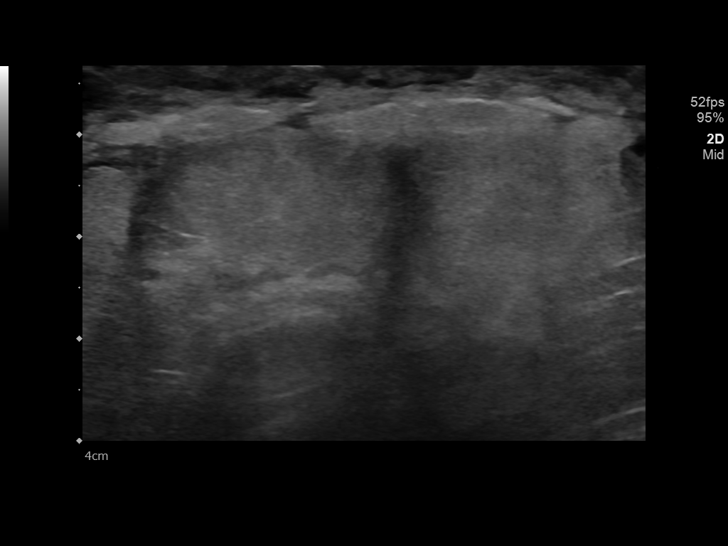

[14 of 24 positions shown; findings below may reference images not displayed]

FINDINGS: Grayscale and color duplex ultrasound performed in the region of
clinical concern on the right chest.

No focal fluid identified. Mild edema within the superficial soft
tissues.
IMPRESSION: Mild edema of the right chest in the region of clinical concern with
no evidence of abscess

## 2019-10-04 ENCOUNTER — Encounter (HOSPITAL_BASED_OUTPATIENT_CLINIC_OR_DEPARTMENT_OTHER): Payer: Self-pay | Admitting: *Deleted

## 2019-10-04 ENCOUNTER — Emergency Department (HOSPITAL_BASED_OUTPATIENT_CLINIC_OR_DEPARTMENT_OTHER)
Admission: EM | Admit: 2019-10-04 | Discharge: 2019-10-04 | Disposition: A | Discharge status: Home / Self Care | Payer: 59 | Attending: Emergency Medicine | Admitting: Emergency Medicine

## 2019-10-04 ENCOUNTER — Other Ambulatory Visit: Payer: Self-pay

## 2019-10-04 DIAGNOSIS — Z79899 Other long term (current) drug therapy: Secondary | ICD-10-CM | POA: Insufficient documentation

## 2019-10-04 DIAGNOSIS — J45909 Unspecified asthma, uncomplicated: Secondary | ICD-10-CM | POA: Insufficient documentation

## 2019-10-04 DIAGNOSIS — I10 Essential (primary) hypertension: Secondary | ICD-10-CM | POA: Insufficient documentation

## 2019-10-04 DIAGNOSIS — M545 Low back pain, unspecified: Secondary | ICD-10-CM

## 2019-10-04 DIAGNOSIS — Z91040 Latex allergy status: Secondary | ICD-10-CM | POA: Diagnosis not present

## 2019-10-04 DIAGNOSIS — M549 Dorsalgia, unspecified: Secondary | ICD-10-CM | POA: Diagnosis present

## 2019-10-04 DIAGNOSIS — F17210 Nicotine dependence, cigarettes, uncomplicated: Secondary | ICD-10-CM | POA: Diagnosis not present

## 2019-10-04 MED ORDER — CYCLOBENZAPRINE HCL 10 MG PO TABS: 10.0000 mg | ORAL_TABLET | Freq: Two times a day (BID) | ORAL | 0 refills | Status: DC | PRN

## 2019-10-04 MED ORDER — KETOROLAC TROMETHAMINE 60 MG/2ML IM SOLN
60.0000 mg | Freq: Once | INTRAMUSCULAR | Status: AC
  Administered 2019-10-04: 60 mg via INTRAMUSCULAR
  Filled 2019-10-04: qty 2

## 2019-10-04 MED ORDER — IBUPROFEN 800 MG PO TABS: 800.0000 mg | ORAL_TABLET | Freq: Three times a day (TID) | ORAL | 0 refills | Status: DC | PRN

## 2019-10-04 MED FILL — IBUPROFEN 800 MG TAB: 800 | 10 days supply | Qty: 30 | Fill #0

## 2019-10-04 MED FILL — CYCLOBENZAPRINE HCL 10 MG T: 10 | 10 days supply | Qty: 20 | Fill #0

## 2019-10-04 NOTE — Discharge Instructions (Signed)
Take 800 mg of ibuprofen every 8 hours for the next 5 days and then as needed, take 1000 mg of Tylenol 4 times a day for the next 5 days and then as needed, take Flexeril as needed for muscle spasm.  Do not mix Flexeril with drugs or alcohol as it can make you sleepy.  Do not drive while taking this medicine as well.

## 2019-10-04 NOTE — ED Triage Notes (Signed)
Pt c/o lower back pain x 2 days , denies injury

## 2019-10-04 NOTE — ED Provider Notes (Signed)
[facility] EMERGENCY DEPARTMENT Provider Note   CSN: [id number] Arrival date & time: 10/04/19  1301     History Chief Complaint  Patient presents with  . Back Pain    Sherri Reid is a 44 y.o. female.  The history is provided by the patient.  Back Pain Location:  Lumbar spine Quality:  Aching Radiates to:  Does not radiate Pain severity:  Mild Onset quality:  Gradual Duration:  2 days Timing:  Intermittent Progression:  Waxing and waning Chronicity:  New Context: twisting (lifting things at work)   Relieved by:  OTC medications Worsened by:  Nothing Associated symptoms: no abdominal pain, no abdominal swelling, no bladder incontinence, no bowel incontinence, no chest pain, no dysuria, no fever, no headaches, no leg pain, no numbness, no paresthesias, no pelvic pain, no perianal numbness, no tingling, no weakness and no weight loss   Risk factors: not pregnant        Past Medical History:  Diagnosis Date  . Asthma   . History of syphilis    treated  . Hypertension   . Vaginal Pap smear, abnormal    f/u ok    Patient Active Problem List   Diagnosis Date Noted  . Non-intractable vomiting   . AKI (acute kidney injury) (HCC) 01/03/2018  . Chronic anemia 01/03/2018  . Obesity, Class III, BMI 40-49.9 (morbid obesity) (HCC)   . Leukocytosis   . Cellulitis of breast 01/02/2018    Past Surgical History:  Procedure Laterality Date  . NO PAST SURGERIES       OB History    Gravida  1   Para  0   Term  0   Preterm  0   AB  1   Living  0     SAB  1   TAB  0   Ectopic  0   Multiple  0   Live Births              Family History  Problem Relation Age of Onset  . Hyperlipidemia Mother   . Mental illness Mother   . Hypertension Mother   . Diabetes Father   . Cancer Maternal Aunt   . Cancer Paternal Uncle   . Anesthesia problems Neg Hx     Social History   Tobacco Use  . Smoking status: Current Every Day Smoker     Packs/day: 1.00    Years: 22.00    Pack years: 22.00    Types: Cigarettes  . Smokeless tobacco: Never Used  Vaping Use  . Vaping Use: Never used  Substance Use Topics  . Alcohol use: Yes    Comment: occ  . Drug use: No    Home Medications Prior to Admission medications   Medication Sig Start Date End Date Taking? Authorizing Provider  cyclobenzaprine (FLEXERIL) 10 MG tablet Take 1 tablet (10 mg total) by mouth 2 (two) times daily as needed for up to 20 doses for muscle spasms. 10/04/19   Caylan Schifano, DO  ibuprofen (ADVIL) 800 MG tablet Take 1 tablet (800 mg total) by mouth every 8 (eight) hours as needed for up to 30 doses. 10/04/19   Anyelo Mccue, DO  valsartan-hydrochlorothiazide (DIOVAN-HCT) 160-25 MG tablet Take 1 tablet by mouth daily. 06/09/19   Albrizze, Kaitlyn E, PA-C    Allergies    Latex and Penicillins  Review of Systems   Review of Systems  Constitutional: Negative for fever and weight loss.  Cardiovascular: Negative for chest pain.  Gastrointestinal: Negative for abdominal pain and bowel incontinence.  Genitourinary: Negative for bladder incontinence, dysuria and pelvic pain.  Musculoskeletal: Positive for back pain. Negative for arthralgias, gait problem, joint swelling, myalgias, neck pain and neck stiffness.  Skin: Negative for color change, pallor, rash and wound.  Neurological: Negative for tingling, weakness, numbness, headaches and paresthesias.    Physical Exam Updated Vital Signs BP 122/70   Pulse 90   Temp 98.3 F (36.8 C) (Oral)   Resp 18   Ht 5\' 2"  (1.575 m)   Wt 122.5 kg   LMP 08/30/2019   SpO2 100%   BMI 49.38 kg/m   Physical Exam Vitals and nursing note reviewed.  Constitutional:      General: She is not in acute distress.    Appearance: She is well-developed.  HENT:     Head: Normocephalic and atraumatic.  Eyes:     Conjunctiva/sclera: Conjunctivae normal.  Cardiovascular:     Rate and Rhythm: Normal rate and regular rhythm.      Pulses: Normal pulses.     Heart sounds: No murmur heard.   Pulmonary:     Effort: Pulmonary effort is normal. No respiratory distress.     Breath sounds: Normal breath sounds.  Abdominal:     Palpations: Abdomen is soft.     Tenderness: There is no abdominal tenderness.  Musculoskeletal:        General: Tenderness present. Normal range of motion.     Cervical back: Normal range of motion and neck supple.     Comments: No midline spinal tenderness, tenderness to paraspinal lumbar muscles bilaterally with increased tone  Skin:    General: Skin is warm and dry.     Capillary Refill: Capillary refill takes less than 2 seconds.  Neurological:     General: No focal deficit present.     Mental Status: She is alert and oriented to person, place, and time.     Cranial Nerves: No cranial nerve deficit.     Sensory: No sensory deficit.     Motor: No weakness.     Coordination: Coordination normal.     Comments: 5+ out of 5 strength throughout, normal sensation     ED Results / Procedures / Treatments   Labs (all labs ordered are listed, but only abnormal results are displayed) Labs Reviewed - No data to display  EKG None  Radiology No results found.  Procedures Procedures (including critical care time)  Medications Ordered in ED Medications  ketorolac (TORADOL) injection 60 mg (has no administration in time range)    ED Course  I have reviewed the triage vital signs and the nursing notes.  Pertinent labs & imaging results that were available during my care of the patient were reviewed by me and considered in my medical decision making (see chart for details).    MDM Rules/Calculators/A&P                          Sherri Reid is a 44 year old female who presents to the ED with back pain.  Normal vitals.  No fever.  Normal neurological exam.  Tenderness to paraspinal lumbar muscles bilaterally.  No midline spinal tenderness.  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   No symptoms to suggest cauda equina or sciatica.  Will prescribe Motrin, Flexeril.  Recommend continued use of Tylenol as well.  Given return precautions and discharged in ED in good condition.  This chart was dictated using  voice recognition software.  Despite best efforts to proofread,  errors can occur which can change the documentation meaning.   Final Clinical Impression(s) / ED Diagnoses Final diagnoses:  Acute bilateral low back pain without sciatica    Rx / DC Orders ED Discharge Orders         Ordered    ibuprofen (ADVIL) 800 MG tablet  Every 8 hours PRN        10/04/19 1328    cyclobenzaprine (FLEXERIL) 10 MG tablet  2 times daily PRN        10/04/19 1328           Merdith Boyd, DO 10/04/19 1332

## 2019-11-18 ENCOUNTER — Other Ambulatory Visit: Payer: Self-pay

## 2019-11-18 ENCOUNTER — Emergency Department (HOSPITAL_COMMUNITY)
Admission: EM | Admit: 2019-11-18 | Discharge: 2019-11-18 | Disposition: A | Discharge status: Home / Self Care | Payer: 59 | Attending: Emergency Medicine | Admitting: Emergency Medicine

## 2019-11-18 ENCOUNTER — Encounter (HOSPITAL_COMMUNITY): Payer: Self-pay | Admitting: Emergency Medicine

## 2019-11-18 DIAGNOSIS — X500XXA Overexertion from strenuous movement or load, initial encounter: Secondary | ICD-10-CM | POA: Insufficient documentation

## 2019-11-18 DIAGNOSIS — J45909 Unspecified asthma, uncomplicated: Secondary | ICD-10-CM | POA: Insufficient documentation

## 2019-11-18 DIAGNOSIS — F17210 Nicotine dependence, cigarettes, uncomplicated: Secondary | ICD-10-CM | POA: Diagnosis not present

## 2019-11-18 DIAGNOSIS — Z79899 Other long term (current) drug therapy: Secondary | ICD-10-CM | POA: Insufficient documentation

## 2019-11-18 DIAGNOSIS — S39012A Strain of muscle, fascia and tendon of lower back, initial encounter: Secondary | ICD-10-CM | POA: Diagnosis not present

## 2019-11-18 DIAGNOSIS — I10 Essential (primary) hypertension: Secondary | ICD-10-CM | POA: Insufficient documentation

## 2019-11-18 DIAGNOSIS — M5432 Sciatica, left side: Secondary | ICD-10-CM

## 2019-11-18 MED ORDER — PREDNISONE 10 MG (21) PO TBPK: ORAL_TABLET | Freq: Every day | ORAL | 0 refills | Status: DC

## 2019-11-18 MED ORDER — METHOCARBAMOL 500 MG PO TABS: 500.0000 mg | ORAL_TABLET | Freq: Two times a day (BID) | ORAL | 0 refills | Status: DC

## 2019-11-18 MED ORDER — LIDOCAINE 5 % EX PTCH
1.0000 | MEDICATED_PATCH | CUTANEOUS | Status: DC
  Administered 2019-11-18: 1 via TRANSDERMAL
  Filled 2019-11-18: qty 1

## 2019-11-18 MED ORDER — KETOROLAC TROMETHAMINE 30 MG/ML IJ SOLN
30.0000 mg | Freq: Once | INTRAMUSCULAR | Status: AC
  Administered 2019-11-18: 30 mg via INTRAMUSCULAR
  Filled 2019-11-18: qty 1

## 2019-11-18 MED ORDER — MELOXICAM 7.5 MG PO TABS: 7.5000 mg | ORAL_TABLET | Freq: Every day | ORAL | 0 refills | Status: AC

## 2019-11-18 NOTE — ED Triage Notes (Signed)
Has had L lower back pain that shoots down her L leg x3 days, she stands a lot at work and thinks that aggravated her pain. Took ibprofen w/o relief, flexeril w/ some relief.

## 2019-11-18 NOTE — ED Notes (Signed)
Pt escorted to discharge window. Verbalized understanding discharge instructions, prescriptions, and stretching. In no acute distress.

## 2019-11-18 NOTE — ED Provider Notes (Signed)
[facility]-EMERGENCY DEPT Provider Note   CSN: [id number] Arrival date & time: 11/18/19  1317     History Chief Complaint  Patient presents with  . Back Pain    Maude Hettich is a 44 y.o. female.  44 year old female presents with complaint of left low back pain for the past few days now with pain radiating down posterior left thigh to her knee.  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  Denies falls or injuries, abdominal pain, changes in bowel or bladder habits, groin numbness.  No other complaints or concerns.  Patient took ibuprofen without improvement in her pain.        Past Medical History:  Diagnosis Date  . Asthma   . History of syphilis    treated  . Hypertension   . Vaginal Pap smear, abnormal    f/u ok    Patient Active Problem List   Diagnosis Date Noted  . Non-intractable vomiting   . AKI (acute kidney injury) (HCC) 01/03/2018  . Chronic anemia 01/03/2018  . Obesity, Class III, BMI 40-49.9 (morbid obesity) (HCC)   . Leukocytosis   . Cellulitis of breast 01/02/2018    Past Surgical History:  Procedure Laterality Date  . NO PAST SURGERIES       OB History    Gravida  1   Para  0   Term  0   Preterm  0   AB  1   Living  0     SAB  1   TAB  0   Ectopic  0   Multiple  0   Live Births              Family History  Problem Relation Age of Onset  . Hyperlipidemia Mother   . Mental illness Mother   . Hypertension Mother   . Diabetes Father   . Cancer Maternal Aunt   . Cancer Paternal Uncle   . Anesthesia problems Neg Hx     Social History   Tobacco Use  . Smoking status: Current Every Day Smoker    Packs/day: 1.00    Years: 22.00    Pack years: 22.00    Types: Cigarettes  . Smokeless tobacco: Never Used  Vaping Use  . Vaping Use: Never used  Substance Use Topics  .  Alcohol use: Yes    Comment: occ  . Drug use: No    Home Medications Prior to Admission medications   Medication Sig Start Date End Date Taking? Authorizing Provider  meloxicam (MOBIC) 7.5 MG tablet Take 1 tablet (7.5 mg total) by mouth daily for 10 days. 11/18/19 11/28/19  Jeannie Fend, PA-C  methocarbamol (ROBAXIN) 500 MG tablet Take 1 tablet (500 mg total) by mouth 2 (two) times daily. 11/18/19   Jeannie Fend, PA-C  predniSONE (STERAPRED UNI-PAK 21 TAB) 10 MG (21) TBPK tablet Take by mouth daily. Take 6 tabs by mouth daily  for 2 days, then 5 tabs for 2 days, then 4 tabs for 2 days, then 3 tabs for 2 days, 2 tabs for 2 days, then 1 tab by mouth daily for 2 days 11/18/19   Jeannie Fend, PA-C  valsartan-hydrochlorothiazide (DIOVAN-HCT) 160-25 MG tablet Take 1 tablet by mouth daily. 06/09/19   Shanon Ace, PA-C    Allergies  Latex and Penicillins  Review of Systems   Review of Systems  Constitutional: Negative for fever.  Gastrointestinal: Negative for abdominal pain, constipation and diarrhea.  Genitourinary: Negative for decreased urine volume and difficulty urinating.  Musculoskeletal: Positive for back pain and myalgias.  Skin: Negative for rash and wound.  Allergic/Immunologic: Negative for immunocompromised state.  Neurological: Negative for weakness and numbness.    Physical Exam Updated Vital Signs BP (!) 140/98   Pulse 90   Temp 98.1 F (36.7 C) (Oral)   Resp 18   Ht 5\' 2"  (1.575 m)   Wt 120.2 kg   SpO2 100%   BMI 48.47 kg/m   Physical Exam Vitals and nursing note reviewed.  Constitutional:      General: She is not in acute distress.    Appearance: She is well-developed. She is not diaphoretic.  HENT:     Head: Normocephalic and atraumatic.  Pulmonary:     Effort: Pulmonary effort is normal.  Abdominal:     Palpations: Abdomen is soft.     Tenderness: There is no abdominal tenderness.  Musculoskeletal:        General: Tenderness  present. No swelling.     Comments: No pain with range of motion of the equal leg strength, sensation and reflexes intact and symmetric.  Tenderness to palpation along the left lower back into the left SI, no midline or bony tenderness.  Skin:    General: Skin is warm and dry.     Findings: No erythema or rash.  Neurological:     Mental Status: She is alert and oriented to person, place, and time.     Sensory: No sensory deficit.     Motor: No weakness.     Gait: Gait normal.     Deep Tendon Reflexes: Reflexes normal.  Psychiatric:        Behavior: Behavior normal.     ED Results / Procedures / Treatments   Labs (all labs ordered are listed, but only abnormal results are displayed) Labs Reviewed - No data to display  EKG None  Radiology No results found.  Procedures Procedures (including critical care time)  Medications Ordered in ED Medications  lidocaine (LIDODERM) 5 % 1 patch (has no administration in time range)  ketorolac (TORADOL) 30 MG/ML injection 30 mg (has no administration in time range)    ED Course  I have reviewed the triage vital signs and the nursing notes.  Pertinent labs & imaging results that were available during my care of the patient were reviewed by me and considered in my medical decision making (see chart for details).  Clinical Course as of Nov 17 1721  04-15-1979 Nov 18, 2019  [id number] 44 year old female with left lower back pain radiates to left posterior thigh.  No red flag symptoms, on exam found to have left paraspinous tenderness extending to left SI.  Discussed radicular pain with patient, recommend NSAID, muscle relaxant and short course of prednisone.  Recommend warm compresses and gentle stretching and recheck with PCP.   [LM]    Clinical Course User Index [LM] 55   MDM Rules/Calculators/A&P                          Final Clinical Impression(s) / ED Diagnoses Final diagnoses:  Strain of lumbar region, initial encounter    Sciatica of left side    Rx / DC Orders ED Discharge Orders  Ordered    meloxicam (MOBIC) 7.5 MG tablet  Daily        11/18/19 1719    methocarbamol (ROBAXIN) 500 MG tablet  2 times daily        11/18/19 1719    predniSONE (STERAPRED UNI-PAK 21 TAB) 10 MG (21) TBPK tablet  Daily        11/18/19 1719           Jeannie Fend, PA-C 11/18/19 1723    Sabas Sous, MD 11/18/19 2332

## 2019-11-18 NOTE — Discharge Instructions (Signed)
Apply warm compresses to low back for 20 minutes followed by gentle stretching as tolerated.  Take Meloxicam and Robaxin. Take prednisone as prescribed and complete the full course.  Recheck with your doctor.

## 2020-01-03 ENCOUNTER — Emergency Department (HOSPITAL_BASED_OUTPATIENT_CLINIC_OR_DEPARTMENT_OTHER)
Admission: EM | Admit: 2020-01-03 | Discharge: 2020-01-03 | Disposition: A | Discharge status: Home / Self Care | Payer: 59 | Attending: Emergency Medicine | Admitting: Emergency Medicine

## 2020-01-03 ENCOUNTER — Encounter (HOSPITAL_BASED_OUTPATIENT_CLINIC_OR_DEPARTMENT_OTHER): Payer: Self-pay | Admitting: Emergency Medicine

## 2020-01-03 ENCOUNTER — Other Ambulatory Visit: Payer: Self-pay

## 2020-01-03 DIAGNOSIS — I10 Essential (primary) hypertension: Secondary | ICD-10-CM | POA: Insufficient documentation

## 2020-01-03 DIAGNOSIS — R200 Anesthesia of skin: Secondary | ICD-10-CM | POA: Insufficient documentation

## 2020-01-03 DIAGNOSIS — M25511 Pain in right shoulder: Secondary | ICD-10-CM | POA: Insufficient documentation

## 2020-01-03 DIAGNOSIS — F17210 Nicotine dependence, cigarettes, uncomplicated: Secondary | ICD-10-CM | POA: Insufficient documentation

## 2020-01-03 DIAGNOSIS — J45909 Unspecified asthma, uncomplicated: Secondary | ICD-10-CM | POA: Insufficient documentation

## 2020-01-03 DIAGNOSIS — Z91040 Latex allergy status: Secondary | ICD-10-CM | POA: Insufficient documentation

## 2020-01-03 MED ORDER — PREDNISONE 20 MG PO TABS: 40.0000 mg | ORAL_TABLET | Freq: Every day | ORAL | 0 refills | Status: DC

## 2020-01-03 MED ORDER — METHOCARBAMOL 500 MG PO TABS: 500.0000 mg | ORAL_TABLET | Freq: Three times a day (TID) | ORAL | 0 refills | Status: DC | PRN

## 2020-01-03 NOTE — ED Provider Notes (Signed)
[facility] EMERGENCY DEPARTMENT Provider Note   CSN: [id number] Arrival date & time: 01/03/20  0232     History Chief Complaint  Patient presents with   Arm Pain    Sherri Reid is a 44 y.o. female.  HPI Patient presents with pain in the right shoulder and some radiation down to the right hand/forearm.  States began around 3 weeks ago.  No trauma.  Worse with certain movements.  States the pain began gradually.  States it feels as if there is a numbness or burning in the forearm at times.  No neck pain.  Does not feel weak in the arm.  No weight loss.  No fevers or chills.    Past Medical History:  Diagnosis Date   Asthma    History of syphilis    treated   Hypertension    Vaginal Pap smear, abnormal    f/u ok    Patient Active Problem List   Diagnosis Date Noted   Non-intractable vomiting    AKI (acute kidney injury) (HCC) 01/03/2018   Chronic anemia 01/03/2018   Obesity, Class III, BMI 40-49.9 (morbid obesity) (HCC)    Leukocytosis    Cellulitis of breast 01/02/2018    Past Surgical History:  Procedure Laterality Date   NO PAST SURGERIES       OB History    Gravida  1   Para  0   Term  0   Preterm  0   AB  1   Living  0     SAB  1   IAB  0   Ectopic  0   Multiple  0   Live Births              Family History  Problem Relation Age of Onset   Hyperlipidemia Mother    Mental illness Mother    Hypertension Mother    Diabetes Father    Cancer Maternal Aunt    Cancer Paternal Uncle    Anesthesia problems Neg Hx     Social History   Tobacco Use   Smoking status: Current Every Day Smoker    Packs/day: 1.00    Years: 22.00    Pack years: 22.00    Types: Cigarettes   Smokeless tobacco: Never Used  Building services engineer Use: Never used  Substance Use Topics   Alcohol use: Yes    Comment: occ   Drug use: No    Home Medications Prior to Admission medications   Medication Sig Start Date End  Date Taking? Authorizing Provider  methocarbamol (ROBAXIN) 500 MG tablet Take 1 tablet (500 mg total) by mouth every 8 (eight) hours as needed for muscle spasms. 01/03/20  Yes Benjiman Core, MD  predniSONE (DELTASONE) 20 MG tablet Take 2 tablets (40 mg total) by mouth daily. 01/03/20  Yes Benjiman Core, MD    Allergies    Latex and Penicillins  Review of Systems   Review of Systems  Constitutional: Negative for appetite change.  HENT: Negative for congestion.   Respiratory: Negative for shortness of breath.   Cardiovascular: Negative for palpitations.  Gastrointestinal: Negative for abdominal pain.  Genitourinary: Negative for dysuria.  Musculoskeletal: Negative for back pain and neck pain.       Right shoulder pain right forearm and hand numbness/weakness.  States that numbness is over the thumb area of the hand.  Neurological: Positive for numbness. Negative for weakness.    Physical Exam Updated Vital Signs BP Marland Kitchen)  156/96 (BP Location: Left Arm)    Pulse 88    Temp 98.2 F (36.8 C) (Oral)    Resp 16    Ht 5\' 2"  (1.575 m)    Wt 113.4 kg    SpO2 100%    BMI 45.73 kg/m   Physical Exam HENT:     Head: Normocephalic and atraumatic.  Eyes:     General: No scleral icterus.    Pupils: Pupils are equal, round, and reactive to light.  Pulmonary:     Effort: Pulmonary effort is normal.  Abdominal:     Tenderness: There is no abdominal tenderness.  Musculoskeletal:     Cervical back: Neck supple. No tenderness.     Comments: Some tenderness over lateral trapezius on the right side.  Good range of motion right shoulder.  No laxity.  Good range of motion elbow.  No midline tenderness on neck.  Neurovascular intact in right hand.  Strong radial pulse.  Patient points at the dorsal thenar area as the area where the numbness is.  Good range of motion the wrist.  Skin:    Capillary Refill: Capillary refill takes less than 2 seconds.  Neurological:     General: No focal deficit  present.     Mental Status: She is alert.     ED Results / Procedures / Treatments   Labs (all labs ordered are listed, but only abnormal results are displayed) Labs Reviewed - No data to display  EKG None  Radiology No results found.  Procedures Procedures (including critical care time)  Medications Ordered in ED Medications - No data to display  ED Course  I have reviewed the triage vital signs and the nursing notes.  Pertinent labs & imaging results that were available during my care of the patient were reviewed by me and considered in my medical decision making (see chart for details).    MDM Rules/Calculators/A&P                          Patient with shoulder pain going down to the arm.  No edema.  Strong pulse.  Some radicular type symptoms with numbness in the forearm and hand although rather benign exam.  Will treat with steroids.  Patient had tried Motrin with minimal Aleve at home and had tried some muscle relaxer with mild relief.  Will give muscle relaxer and steroids and sports medicine follow-up.  Do not feel as if she needs imaging at this time with benign shoulder and neck exam. Final Clinical Impression(s) / ED Diagnoses Final diagnoses:  Acute pain of right shoulder    Rx / DC Orders ED Discharge Orders         Ordered    predniSONE (DELTASONE) 20 MG tablet  Daily        01/03/20 0428    methocarbamol (ROBAXIN) 500 MG tablet  Every 8 hours PRN        01/03/20 0428           01/05/20, MD 01/03/20 [phone]

## 2020-01-03 NOTE — ED Triage Notes (Signed)
Pt c/o pain in right shoulder radiating down arm with intermittent numbness. Pt also has knot on posterior right wrist.

## 2020-01-09 ENCOUNTER — Telehealth: Payer: Self-pay | Admitting: Family Medicine

## 2020-01-09 NOTE — Telephone Encounter (Signed)
Called pt to offer ED follow up appt --attempt unsuccessful, phone rings fast busy, unable to leave message  Will cb later -glh

## 2020-01-28 ENCOUNTER — Encounter (HOSPITAL_BASED_OUTPATIENT_CLINIC_OR_DEPARTMENT_OTHER): Payer: Self-pay | Admitting: Emergency Medicine

## 2020-01-28 ENCOUNTER — Emergency Department (HOSPITAL_BASED_OUTPATIENT_CLINIC_OR_DEPARTMENT_OTHER)
Admission: EM | Admit: 2020-01-28 | Discharge: 2020-01-28 | Disposition: A | Discharge status: Home / Self Care | Payer: HRSA Program | Attending: Emergency Medicine | Admitting: Emergency Medicine

## 2020-01-28 ENCOUNTER — Other Ambulatory Visit: Payer: Self-pay

## 2020-01-28 DIAGNOSIS — U071 COVID-19: Secondary | ICD-10-CM | POA: Diagnosis not present

## 2020-01-28 DIAGNOSIS — Z91040 Latex allergy status: Secondary | ICD-10-CM | POA: Insufficient documentation

## 2020-01-28 DIAGNOSIS — J45909 Unspecified asthma, uncomplicated: Secondary | ICD-10-CM | POA: Diagnosis not present

## 2020-01-28 DIAGNOSIS — I10 Essential (primary) hypertension: Secondary | ICD-10-CM | POA: Insufficient documentation

## 2020-01-28 DIAGNOSIS — F17210 Nicotine dependence, cigarettes, uncomplicated: Secondary | ICD-10-CM | POA: Insufficient documentation

## 2020-01-28 DIAGNOSIS — Z20822 Contact with and (suspected) exposure to covid-19: Secondary | ICD-10-CM

## 2020-01-28 DIAGNOSIS — R059 Cough, unspecified: Secondary | ICD-10-CM | POA: Diagnosis present

## 2020-01-28 MED ORDER — IBUPROFEN 800 MG PO TABS
800.0000 mg | ORAL_TABLET | Freq: Once | ORAL | Status: AC
  Administered 2020-01-28: 800 mg via ORAL
  Filled 2020-01-28: qty 1

## 2020-01-28 MED ORDER — ONDANSETRON 4 MG PO TBDP: 4.0000 mg | ORAL_TABLET | Freq: Three times a day (TID) | ORAL | 0 refills | Status: DC | PRN

## 2020-01-28 MED ORDER — ONDANSETRON 4 MG PO TBDP
4.0000 mg | ORAL_TABLET | Freq: Once | ORAL | Status: AC
  Administered 2020-01-28: 4 mg via ORAL
  Filled 2020-01-28: qty 1

## 2020-01-28 NOTE — Discharge Instructions (Signed)
Your COVID test is pending; you should expect results within 24 hours. You can access your results on your MyChart--if you test positive you should receive a phone call.  In the meantime follow CDC guidelines and quarantine, wear a mask, wash hands often.   Please use Tylenol or ibuprofen for pain.  You may use 600 mg ibuprofen every 6 hours or 1000 mg of Tylenol every 6 hours.  You may choose to alternate between the 2.  This would be most effective.  Not to exceed 4 g of Tylenol within 24 hours.  Not to exceed 3200 mg ibuprofen 24 hours.   Please take over the counter vitamin D 2000-4000 units per day. I also recommend zinc 50 mg per day for the next two weeks.   Please return to ED if you feel have difficulty breathing or have emergent, new or concerning symptoms.  Patients who have symptoms consistent with COVID-19 should self isolated for: At least 3 days (72 hours) have passed since recovery, defined as resolution of fever without the use of fever reducing medications and improvement in respiratory symptoms (e.g., cough, shortness of breath), and At least 7 days have passed since symptoms first appeared.       Person Under Monitoring Name: Sherri Reid  Location: [address] [place] [address]   Infection Prevention Recommendations for Individuals Confirmed to have, or Being Evaluated for, 2019 Novel Coronavirus (COVID-19) Infection Who Receive Care at Home  Individuals who are confirmed to have, or are being evaluated for, COVID-19 should follow the prevention steps below until a healthcare provider or local or state health department says they can return to normal activities.  Stay home except to get medical care You should restrict activities outside your home, except for getting medical care. Do not go to work, school, or public areas, and do not use public transportation or taxis.  Call ahead before visiting your doctor Before your medical  appointment, call the healthcare provider and tell them that you have, or are being evaluated for, COVID-19 infection. This will help the healthcare providers office take steps to keep other people from getting infected. Ask your healthcare provider to call the local or state health department.  Monitor your symptoms Seek prompt medical attention if your illness is worsening (e.g., difficulty breathing). Before going to your medical appointment, call the healthcare provider and tell them that you have, or are being evaluated for, COVID-19 infection. Ask your healthcare provider to call the local or state health department.  Wear a facemask You should wear a facemask that covers your nose and mouth when you are in the same room with other people and when you visit a healthcare provider. People who live with or visit you should also wear a facemask while they are in the same room with you.  Separate yourself from other people in your home As much as possible, you should stay in a different room from other people in your home. Also, you should use a separate bathroom, if available.  Avoid sharing household items You should not share dishes, drinking glasses, cups, eating utensils, towels, bedding, or other items with other people in your home. After using these items, you should wash them thoroughly with soap and water.  Cover your coughs and sneezes Cover your mouth and nose with a tissue when you cough or sneeze, or you can cough or sneeze into your sleeve. Throw used tissues in a lined trash can, and immediately wash your  hands with soap and water for at least 20 seconds or use an alcohol-based hand rub.  Wash your [employer] your hands often and thoroughly with soap and water for at least 20 seconds. You can use an alcohol-based hand sanitizer if soap and water are not available and if your hands are not visibly dirty. Avoid touching your eyes, nose, and mouth with unwashed  hands.   Prevention Steps for Caregivers and Household Members of Individuals Confirmed to have, or Being Evaluated for, COVID-19 Infection Being Cared for in the Home  If you live with, or provide care at home for, a person confirmed to have, or being evaluated for, COVID-19 infection please follow these guidelines to prevent infection:  Follow healthcare providers instructions Make sure that you understand and can help the patient follow any healthcare provider instructions for all care.  Provide for the patients basic needs You should help the patient with basic needs in the home and provide support for getting groceries, prescriptions, and other personal needs.  Monitor the patients symptoms If they are getting sicker, call his or her medical provider and tell them that the patient has, or is being evaluated for, COVID-19 infection. This will help the healthcare providers office take steps to keep other people from getting infected. Ask the healthcare provider to call the local or state health department.  Limit the number of people who have contact with the patient If possible, have only one caregiver for the patient. Other household members should stay in another home or place of residence. If this is not possible, they should stay in another room, or be separated from the patient as much as possible. Use a separate bathroom, if available. Restrict visitors who do not have an essential need to be in the home.  Keep older adults, very young children, and other sick people away from the patient Keep older adults, very young children, and those who have compromised immune systems or chronic health conditions away from the patient. This includes people with chronic heart, lung, or kidney conditions, diabetes, and cancer.  Ensure good ventilation Make sure that shared spaces in the home have good air flow, such as from an air conditioner or an opened window, weather  permitting.  Wash your hands often Wash your hands often and thoroughly with soap and water for at least 20 seconds. You can use an alcohol based hand sanitizer if soap and water are not available and if your hands are not visibly dirty. Avoid touching your eyes, nose, and mouth with unwashed hands. Use disposable paper towels to dry your hands. If not available, use dedicated cloth towels and replace them when they become wet.  Wear a facemask and gloves Wear a disposable facemask at all times in the room and gloves when you touch or have contact with the patients blood, body fluids, and/or secretions or excretions, such as sweat, saliva, sputum, nasal mucus, vomit, urine, or feces.  Ensure the mask fits over your nose and mouth tightly, and do not touch it during use. Throw out disposable facemasks and gloves after using them. Do not reuse. Wash your hands immediately after removing your facemask and gloves. If your personal clothing becomes contaminated, carefully remove clothing and launder. Wash your hands after handling contaminated clothing. Place all used disposable facemasks, gloves, and other waste in a lined container before disposing them with other household waste. Remove gloves and wash your hands immediately after handling these items.  Do not share dishes, glasses,  or other household items with the patient Avoid sharing household items. You should not share dishes, drinking glasses, cups, eating utensils, towels, bedding, or other items with a patient who is confirmed to have, or being evaluated for, COVID-19 infection. After the person uses these items, you should wash them thoroughly with soap and water.  Wash laundry thoroughly Immediately remove and wash clothes or bedding that have blood, body fluids, and/or secretions or excretions, such as sweat, saliva, sputum, nasal mucus, vomit, urine, or feces, on them. Wear gloves when handling laundry from the patient. Read and  follow directions on labels of laundry or clothing items and detergent. In general, wash and dry with the warmest temperatures recommended on the label.  Clean all areas the individual has used often Clean all touchable surfaces, such as counters, tabletops, doorknobs, bathroom fixtures, toilets, phones, keyboards, tablets, and bedside tables, every day. Also, clean any surfaces that may have blood, body fluids, and/or secretions or excretions on them. Wear gloves when cleaning surfaces the patient has come in contact with. Use a diluted bleach solution (e.g., dilute bleach with 1 part bleach and 10 parts water) or a household disinfectant with a label that says EPA-registered for coronaviruses. To make a bleach solution at home, add 1 tablespoon of bleach to 1 quart (4 cups) of water. For a larger supply, add  cup of bleach to 1 gallon (16 cups) of water. Read labels of cleaning products and follow recommendations provided on product labels. Labels contain instructions for safe and effective use of the cleaning product including precautions you should take when applying the product, such as wearing gloves or eye protection and making sure you have good ventilation during use of the product. Remove gloves and wash hands immediately after cleaning.  Monitor yourself for signs and symptoms of illness Caregivers and household members are considered close contacts, should monitor their health, and will be asked to limit movement outside of the home to the extent possible. Follow the monitoring steps for close contacts listed on the symptom monitoring form.   ? If you have additional questions, contact your local health department or call the epidemiologist on call at [phone] (available 24/7). ? This guidance is subject to change. For the most up-to-date guidance from [facility], please refer to their website: [email or web address]

## 2020-01-28 NOTE — ED Triage Notes (Signed)
Diarrhea since yesterday, headache, fever, body aches. Unsure of covid status. Temp at home 101F

## 2020-01-28 NOTE — ED Provider Notes (Signed)
[facility] EMERGENCY DEPARTMENT Provider Note   CSN: [id number] Arrival date & time: 01/28/20  2049     History Chief Complaint  Patient presents with  . covid symptoms    Sherri Reid is a 45 y.o. female.  HPI Patient endorses headache, fever, body aches, diarrhea, mild dry cough and fatigue.  She states that her symptoms began yesterday.  She states she had a fever of 101 at home.  She states her diarrhea has been loose stool denies any watery diarrhea denies any recent antibiotic use.  No recent travel.  She states she vomited once that was nonbloody nonbilious.  She denies any abdominal pain.  No lightheadedness or dizziness no chest pain or shortness of breath.  No other associated symptoms.  No aggravating leaving factors.    Past Medical History:  Diagnosis Date  . Asthma   . History of syphilis    treated  . Hypertension   . Vaginal Pap smear, abnormal    f/u ok    Patient Active Problem List   Diagnosis Date Noted  . Non-intractable vomiting   . AKI (acute kidney injury) (HCC) 01/03/2018  . Chronic anemia 01/03/2018  . Obesity, Class III, BMI 40-49.9 (morbid obesity) (HCC)   . Leukocytosis   . Cellulitis of breast 01/02/2018    Past Surgical History:  Procedure Laterality Date  . NO PAST SURGERIES       OB History    Gravida  1   Para  0   Term  0   Preterm  0   AB  1   Living  0     SAB  1   IAB  0   Ectopic  0   Multiple  0   Live Births              Family History  Problem Relation Age of Onset  . Hyperlipidemia Mother   . Mental illness Mother   . Hypertension Mother   . Diabetes Father   . Cancer Maternal Aunt   . Cancer Paternal Uncle   . Anesthesia problems Neg Hx     Social History   Tobacco Use  . Smoking status: Current Every Day Smoker    Packs/day: 1.00    Years: 22.00    Pack years: 22.00    Types: Cigarettes  . Smokeless tobacco: Never Used  Vaping Use  . Vaping Use: Never used   Substance Use Topics  . Alcohol use: Yes    Comment: occ  . Drug use: No    Home Medications Prior to Admission medications   Medication Sig Start Date End Date Taking? Authorizing Provider  ondansetron (ZOFRAN ODT) 4 MG disintegrating tablet Take 1 tablet (4 mg total) by mouth every 8 (eight) hours as needed for nausea or vomiting. 01/28/20  Yes Teila Skalsky S, PA  methocarbamol (ROBAXIN) 500 MG tablet Take 1 tablet (500 mg total) by mouth every 8 (eight) hours as needed for muscle spasms. 01/03/20   Benjiman Core, MD  predniSONE (DELTASONE) 20 MG tablet Take 2 tablets (40 mg total) by mouth daily. 01/03/20   Benjiman Core, MD    Allergies    Latex and Penicillins  Review of Systems   Review of Systems  Constitutional: Positive for chills, fatigue and fever.  HENT: Positive for congestion, postnasal drip and rhinorrhea. Negative for sore throat.   Eyes: Negative for redness.  Respiratory: Positive for cough. Negative for shortness of breath.   Cardiovascular:  Negative for chest pain and leg swelling.  Gastrointestinal: Positive for diarrhea, nausea and vomiting. Negative for abdominal pain.  Endocrine: Negative for polyphagia.  Genitourinary: Negative for dysuria.  Musculoskeletal: Positive for myalgias.  Skin: Negative for rash.  Neurological: Positive for headaches. Negative for syncope.  Psychiatric/Behavioral: Negative for confusion.    Physical Exam Updated Vital Signs BP (!) 164/102   Pulse (!) 103   Temp 98.7 F (37.1 C) (Oral)   Resp 18   Ht 5\' 2"  (1.575 m)   Wt 119.7 kg   LMP 01/22/2020 (Approximate)   SpO2 100%   BMI 48.29 kg/m   Physical Exam Vitals and nursing note reviewed.  Constitutional:      General: She is not in acute distress.    Appearance: She is obese.     Comments: Patient is well-appearing pleasant obese 45 year old female no acute distress speaking in full sentences.  No increased work of breathing, no tachypnea  HENT:      Head: Normocephalic and atraumatic.     Nose: Nose normal.  Eyes:     General: No scleral icterus. Cardiovascular:     Rate and Rhythm: Normal rate and regular rhythm.     Pulses: Normal pulses.     Heart sounds: Normal heart sounds.     Comments: Heart rate 96 Pulmonary:     Effort: Pulmonary effort is normal. No respiratory distress.     Breath sounds: No wheezing.     Comments: Lungs are clear to auscultation all fields.  No increased work of breathing. Abdominal:     Palpations: Abdomen is soft.     Tenderness: There is no abdominal tenderness.  Musculoskeletal:     Cervical back: Normal range of motion.     Right lower leg: No edema.     Left lower leg: No edema.  Skin:    General: Skin is warm and dry.     Capillary Refill: Capillary refill takes less than 2 seconds.  Neurological:     Mental Status: She is alert. Mental status is at baseline.  Psychiatric:        Mood and Affect: Mood normal.        Behavior: Behavior normal.     ED Results / Procedures / Treatments   Labs (all labs ordered are listed, but only abnormal results are displayed) Labs Reviewed  SARS CORONAVIRUS 2 (TAT 6-24 HRS)    EKG None  Radiology No results found.  Procedures Procedures (including critical care time)  Medications Ordered in ED Medications  ondansetron (ZOFRAN-ODT) disintegrating tablet 4 mg (has no administration in time range)  ibuprofen (ADVIL) tablet 800 mg (has no administration in time range)    ED Course  I have reviewed the triage vital signs and the nursing notes.  Pertinent labs & imaging results that were available during my care of the patient were reviewed by me and considered in my medical decision making (see chart for details).    MDM Rules/Calculators/A&P                          Patient is 45 year old female presented with numerous symptoms are consistent with COVID-19.  She is well-appearing at this time tolerating p.o. she is not hypoxic she is  lungs are clear to auscultation all fields.  Suspect mild illness.  She is unvaccinated.  COVID test pending at this time.  Given return precautions.  Discharged with Zofran  59 was  evaluated in Emergency Department on 01/28/2020 for the symptoms described in the history of present illness. She was evaluated in the context of the global COVID-19 pandemic, which necessitated consideration that the patient might be at risk for infection with the SARS-CoV-2 virus that causes COVID-19. Institutional protocols and algorithms that pertain to the evaluation of patients at risk for COVID-19 are in a state of rapid change based on information released by regulatory bodies including the CDC and federal and state organizations. These policies and algorithms were followed during the patient's care in the ED.  Final Clinical Impression(s) / ED Diagnoses Final diagnoses:  Suspected COVID-19 virus infection    Rx / DC Orders ED Discharge Orders         Ordered    ondansetron (ZOFRAN ODT) 4 MG disintegrating tablet  Every 8 hours PRN        01/28/20 2304           Gailen Shelter, [place] 01/28/20 2308    Palumbo, April, MD 01/29/20 0001

## 2020-01-29 LAB — SARS CORONAVIRUS 2 (TAT 6-24 HRS): SARS Coronavirus 2: POSITIVE — AB

## 2020-01-30 ENCOUNTER — Ambulatory Visit (HOSPITAL_COMMUNITY)
Admission: RE | Admit: 2020-01-30 | Discharge: 2020-01-30 | Disposition: A | Discharge status: Home / Self Care | Payer: HRSA Program | Source: Ambulatory Visit | Attending: Pulmonary Disease | Admitting: Pulmonary Disease

## 2020-01-30 ENCOUNTER — Other Ambulatory Visit: Payer: Self-pay | Admitting: Physician Assistant

## 2020-01-30 DIAGNOSIS — U071 COVID-19: Secondary | ICD-10-CM | POA: Diagnosis not present

## 2020-01-30 DIAGNOSIS — I10 Essential (primary) hypertension: Secondary | ICD-10-CM

## 2020-01-30 DIAGNOSIS — Z6825 Body mass index (BMI) 25.0-25.9, adult: Secondary | ICD-10-CM | POA: Diagnosis not present

## 2020-01-30 MED ORDER — METHYLPREDNISOLONE SODIUM SUCC 125 MG IJ SOLR: 125.0000 mg | Freq: Once | INTRAMUSCULAR | Status: DC | PRN

## 2020-01-30 MED ORDER — FAMOTIDINE IN NACL 20-0.9 MG/50ML-% IV SOLN: 20.0000 mg | Freq: Once | INTRAVENOUS | Status: DC | PRN

## 2020-01-30 MED ORDER — SOTROVIMAB 500 MG/8ML IV SOLN
500.0000 mg | Freq: Once | INTRAVENOUS | Status: AC
  Administered 2020-01-30: 500 mg via INTRAVENOUS

## 2020-01-30 MED ORDER — DIPHENHYDRAMINE HCL 50 MG/ML IJ SOLN: 50.0000 mg | Freq: Once | INTRAMUSCULAR | Status: DC | PRN

## 2020-01-30 MED ORDER — SODIUM CHLORIDE 0.9 % IV SOLN: INTRAVENOUS | Status: DC | PRN

## 2020-01-30 MED ORDER — EPINEPHRINE 0.3 MG/0.3ML IJ SOAJ: 0.3000 mg | Freq: Once | INTRAMUSCULAR | Status: DC | PRN

## 2020-01-30 MED ORDER — ALBUTEROL SULFATE HFA 108 (90 BASE) MCG/ACT IN AERS: 2.0000 | INHALATION_SPRAY | Freq: Once | RESPIRATORY_TRACT | Status: DC | PRN

## 2020-01-30 NOTE — Progress Notes (Signed)
I connected by phone with Sherri Reid on 01/30/2020 at 9:20 AM to discuss the potential use of a new treatment for mild to moderate COVID-19 viral infection in non-hospitalized patients.  This patient is a 45 y.o. female that meets the FDA criteria for Emergency Use Authorization of COVID monoclonal antibody sotrovimab.  Has a (+) direct SARS-CoV-2 viral test result  Has mild or moderate COVID-19   Is NOT hospitalized due to COVID-19  Is within 10 days of symptom onset  Has at least one of the high risk factor(s) for progression to severe COVID-19 and/or hospitalization as defined in EUA.  Specific high risk criteria : BMI > 25, Cardiovascular disease or hypertension and Other high risk medical condition per CDC:  Tier 1 & SVI of 4   I have spoken and communicated the following to the patient or parent/caregiver regarding COVID monoclonal antibody treatment:  1. FDA has authorized the emergency use for the treatment of mild to moderate COVID-19 in adults and pediatric patients with positive results of direct SARS-CoV-2 viral testing who are 63 years of age and older weighing at least 40 kg, and who are at high risk for progressing to severe COVID-19 and/or hospitalization.  2. The significant known and potential risks and benefits of COVID monoclonal antibody, and the extent to which such potential risks and benefits are unknown.  3. Information on available alternative treatments and the risks and benefits of those alternatives, including clinical trials.  4. Patients treated with COVID monoclonal antibody should continue to self-isolate and use infection control measures (e.g., wear mask, isolate, social distance, avoid sharing personal items, clean and disinfect "high touch" surfaces, and frequent handwashing) according to CDC guidelines.   5. The patient or parent/caregiver has the option to accept or refuse COVID monoclonal antibody treatment.  After reviewing this information  with the patient, the patient has agreed to receive one of the available covid 19 monoclonal antibodies and will be provided an appropriate fact sheet prior to infusion.   [place], [place] 01/30/2020 9:20 AM

## 2020-01-30 NOTE — Progress Notes (Signed)
Patient reviewed Fact Sheet for Patients, Parents, and Caregivers for Emergency Use Authorization (EUA) of sotrovimab for the Treatment of Coronavirus. Patient also reviewed and is agreeable to the estimated cost of treatment. Patient is agreeable to proceed.   

## 2020-01-30 NOTE — Progress Notes (Signed)
Diagnosis: COVID-19  Physician: Dr. Patrick Wright  Procedure: [facility] Med: Sotrovimab infusion - Provided patient with sotrovimab fact sheet for patients, parents, and caregivers prior to infusion.   Complications: No immediate complications noted  Discharge: Discharged home    

## 2020-01-30 NOTE — Discharge Instructions (Signed)

## 2020-03-30 ENCOUNTER — Encounter (HOSPITAL_BASED_OUTPATIENT_CLINIC_OR_DEPARTMENT_OTHER): Payer: Self-pay | Admitting: Emergency Medicine

## 2020-03-30 ENCOUNTER — Other Ambulatory Visit: Payer: Self-pay

## 2020-03-30 ENCOUNTER — Emergency Department (HOSPITAL_BASED_OUTPATIENT_CLINIC_OR_DEPARTMENT_OTHER)
Admission: EM | Admit: 2020-03-30 | Discharge: 2020-03-30 | Disposition: A | Discharge status: Home / Self Care | Payer: Self-pay | Attending: Emergency Medicine | Admitting: Emergency Medicine

## 2020-03-30 DIAGNOSIS — F17210 Nicotine dependence, cigarettes, uncomplicated: Secondary | ICD-10-CM | POA: Insufficient documentation

## 2020-03-30 DIAGNOSIS — K0889 Other specified disorders of teeth and supporting structures: Secondary | ICD-10-CM

## 2020-03-30 DIAGNOSIS — I10 Essential (primary) hypertension: Secondary | ICD-10-CM | POA: Insufficient documentation

## 2020-03-30 DIAGNOSIS — Z91040 Latex allergy status: Secondary | ICD-10-CM | POA: Insufficient documentation

## 2020-03-30 DIAGNOSIS — J45909 Unspecified asthma, uncomplicated: Secondary | ICD-10-CM | POA: Insufficient documentation

## 2020-03-30 DIAGNOSIS — K047 Periapical abscess without sinus: Secondary | ICD-10-CM | POA: Insufficient documentation

## 2020-03-30 MED ORDER — OXYCODONE-ACETAMINOPHEN 5-325 MG PO TABS
1.0000 | ORAL_TABLET | Freq: Four times a day (QID) | ORAL | 0 refills | Status: DC | PRN
Start: 2020-03-30 — End: 2021-06-11

## 2020-03-30 MED ORDER — CLINDAMYCIN HCL 150 MG PO CAPS
300.0000 mg | ORAL_CAPSULE | Freq: Once | ORAL | Status: AC
  Administered 2020-03-30: 300 mg via ORAL
  Filled 2020-03-30: qty 2

## 2020-03-30 MED ORDER — CLINDAMYCIN HCL 300 MG PO CAPS: 300.0000 mg | ORAL_CAPSULE | Freq: Four times a day (QID) | ORAL | 0 refills | Status: DC

## 2020-03-30 NOTE — ED Provider Notes (Signed)
[facility] Provider Note: Lowella Dell, MD, FACEP  CSN: [id number] MRN: [id number] ARRIVAL: 03/30/20 at 0509 ROOM: [facility]02/[facility]02   CHIEF COMPLAINT  Dental Pain   HISTORY OF PRESENT ILLNESS  03/30/20 5:24 AM Sherri Reid is a 45 y.o. female with 2 days of pain associated with her left lower molars.  It is hard for her to specify exactly which tooth.  She has associated swelling of the buccal mucosa.  She rates associated pain is an 8 out of 10, worse with eating.  It has not responded to ibuprofen.   Past Medical History:  Diagnosis Date  . Asthma   . History of syphilis    treated  . Hypertension   . Vaginal Pap smear, abnormal    f/u ok    Past Surgical History:  Procedure Laterality Date  . NO PAST SURGERIES      Family History  Problem Relation Age of Onset  . Hyperlipidemia Mother   . Mental illness Mother   . Hypertension Mother   . Diabetes Father   . Cancer Maternal Aunt   . Cancer Paternal Uncle   . Anesthesia problems Neg Hx     Social History   Tobacco Use  . Smoking status: Current Every Day Smoker    Packs/day: 1.00    Years: 22.00    Pack years: 22.00    Types: Cigarettes  . Smokeless tobacco: Never Used  Vaping Use  . Vaping Use: Never used  Substance Use Topics  . Alcohol use: Yes    Comment: occ  . Drug use: No    Prior to Admission medications   Medication Sig Start Date End Date Taking? Authorizing Provider  clindamycin (CLEOCIN) 300 MG capsule Take 1 capsule (300 mg total) by mouth 4 (four) times daily. X 7 days 03/30/20  Yes Kailan Carmen, MD  oxyCODONE-acetaminophen (PERCOCET) 5-325 MG tablet Take 1 tablet by mouth every 6 (six) hours as needed for severe pain. 03/30/20  Yes Rhona Fusilier, MD    Allergies Latex and Penicillins   REVIEW OF SYSTEMS  Negative except as noted here or in the History of Present Illness.   PHYSICAL EXAMINATION  Initial Vital Signs Blood pressure (!) 162/78, pulse 67, temperature 98.1  F (36.7 C), temperature source Oral, resp. rate 18, height 5\' 2"  (1.575 m), weight 117.9 kg, last menstrual period 03/21/2020, SpO2 100 %.  Examination General: Well-developed, well-nourished female in no acute distress; appearance consistent with age of record HENT: normocephalic; atraumatic; tenderness and swelling of the buccal mucosa adjacent to the left lower molars Eyes: pupils equal, round and reactive to light; extraocular muscles intact Neck: supple; no lymphadenopathy Heart: regular rate and rhythm Lungs: clear to auscultation bilaterally Abdomen: soft; nondistended; nontender; bowel sounds present Extremities: No deformity; full range of motion Neurologic: Awake, alert and oriented; motor function intact in all extremities and symmetric; no facial droop Skin: Warm and dry Psychiatric: Flat affect   RESULTS  Summary of this visit's results, reviewed and interpreted by myself:   EKG Interpretation  Date/Time:    Ventricular Rate:    PR Interval:    QRS Duration:   QT Interval:    QTC Calculation:   R Axis:     Text Interpretation:        Laboratory Studies: No results found for this or any previous visit (from the past 24 hour(s)). Imaging Studies: No results found.  ED COURSE and MDM  Nursing notes, initial and subsequent vitals signs,  including pulse oximetry, reviewed and interpreted by myself.  Vitals:   03/30/20 0517 03/30/20 0518  BP: (!) 162/78   Pulse: 67   Resp: 18   Temp: 98.1 F (36.7 C)   TempSrc: Oral   SpO2: 100%   Weight:  117.9 kg  Height:  5\' 2"  (1.575 m)   Medications  clindamycin (CLEOCIN) capsule 300 mg (has no administration in time range)    We will start on clindamycin and Percocet and refer to dentistry.  PROCEDURES  Procedures   ED DIAGNOSES     ICD-10-CM   1. Dental infection  K04.7   2. Pain, dental  K08.89        , MD 03/30/20 [phone]

## 2020-03-30 NOTE — ED Triage Notes (Signed)
Pt states has bad tooth bottom left, pain x2 days radiate to face and ear. Tried ibuprofen at home

## 2020-05-19 ENCOUNTER — Other Ambulatory Visit: Payer: Self-pay

## 2020-05-19 ENCOUNTER — Emergency Department (HOSPITAL_COMMUNITY): Payer: No Typology Code available for payment source

## 2020-05-19 ENCOUNTER — Encounter (HOSPITAL_COMMUNITY): Payer: Self-pay

## 2020-05-19 ENCOUNTER — Emergency Department (HOSPITAL_COMMUNITY)
Admission: EM | Admit: 2020-05-19 | Discharge: 2020-05-20 | Disposition: A | Discharge status: Home / Self Care | Payer: No Typology Code available for payment source | Attending: Emergency Medicine | Admitting: Emergency Medicine

## 2020-05-19 DIAGNOSIS — J45909 Unspecified asthma, uncomplicated: Secondary | ICD-10-CM | POA: Insufficient documentation

## 2020-05-19 DIAGNOSIS — W208XXA Other cause of strike by thrown, projected or falling object, initial encounter: Secondary | ICD-10-CM | POA: Diagnosis not present

## 2020-05-19 DIAGNOSIS — Y990 Civilian activity done for income or pay: Secondary | ICD-10-CM | POA: Diagnosis not present

## 2020-05-19 DIAGNOSIS — I10 Essential (primary) hypertension: Secondary | ICD-10-CM | POA: Insufficient documentation

## 2020-05-19 DIAGNOSIS — S6991XA Unspecified injury of right wrist, hand and finger(s), initial encounter: Secondary | ICD-10-CM | POA: Diagnosis present

## 2020-05-19 DIAGNOSIS — S62652A Nondisplaced fracture of medial phalanx of right middle finger, initial encounter for closed fracture: Secondary | ICD-10-CM | POA: Diagnosis not present

## 2020-05-19 DIAGNOSIS — F17210 Nicotine dependence, cigarettes, uncomplicated: Secondary | ICD-10-CM | POA: Insufficient documentation

## 2020-05-19 DIAGNOSIS — Z91040 Latex allergy status: Secondary | ICD-10-CM | POA: Diagnosis not present

## 2020-05-19 DIAGNOSIS — S62602A Fracture of unspecified phalanx of right middle finger, initial encounter for closed fracture: Secondary | ICD-10-CM

## 2020-05-19 NOTE — ED Triage Notes (Signed)
Pt states an alternator fell on her right middle finger at work yesterday. Pt states pain is 7/10, pt has decreased movement in finger, has swelling, sensation intact.

## 2020-05-20 ENCOUNTER — Other Ambulatory Visit: Payer: Self-pay

## 2020-05-20 NOTE — ED Notes (Signed)
Ortho tech contacted at this time.  

## 2020-05-20 NOTE — ED Notes (Signed)
Ortho tech at bedside 

## 2020-05-20 NOTE — Progress Notes (Signed)
Orthopedic Tech Progress Note Patient Details:  Yissel Habermehl Feb 04, 1975 [id number]  Ortho Devices Type of Ortho Device: Finger splint Ortho Device/Splint Location: rue middle finger Ortho Device/Splint Interventions: Ordered,Application,Adjustment   Post Interventions Patient Tolerated: Well Instructions Provided: Care of device,Adjustment of device   Trinna Post 05/20/2020, 2:24 AM

## 2020-05-20 NOTE — ED Notes (Signed)
Ortho tech returned call will be here shortly

## 2020-05-20 NOTE — ED Provider Notes (Signed)
[facility] EMERGENCY DEPARTMENT Provider Note   CSN: [id number] Arrival date & time: 05/19/20  2048     History Chief Complaint  Patient presents with  . Finger Injury    Sherri Reid is a 45 y.o. female.  Patient presents to the emergency department with a chief complaint of right middle finger pain.  She states that she dropped an alternator on it earlier.  She reports pain with movement.  It is tender to palpation.  She reports associated swelling.  Denies any successful treatment prior to arrival.  The history is provided by the patient. No language interpreter was used.       Past Medical History:  Diagnosis Date  . Asthma   . History of syphilis    treated  . Hypertension   . Vaginal Pap smear, abnormal    f/u ok    Patient Active Problem List   Diagnosis Date Noted  . Non-intractable vomiting   . AKI (acute kidney injury) (HCC) 01/03/2018  . Chronic anemia 01/03/2018  . Obesity, Class III, BMI 40-49.9 (morbid obesity) (HCC)   . Leukocytosis   . Cellulitis of breast 01/02/2018    Past Surgical History:  Procedure Laterality Date  . NO PAST SURGERIES       OB History    Gravida  1   Para  0   Term  0   Preterm  0   AB  1   Living  0     SAB  1   IAB  0   Ectopic  0   Multiple  0   Live Births              Family History  Problem Relation Age of Onset  . Hyperlipidemia Mother   . Mental illness Mother   . Hypertension Mother   . Diabetes Father   . Cancer Maternal Aunt   . Cancer Paternal Uncle   . Anesthesia problems Neg Hx     Social History   Tobacco Use  . Smoking status: Current Every Day Smoker    Packs/day: 1.00    Years: 22.00    Pack years: 22.00    Types: Cigarettes  . Smokeless tobacco: Never Used  Vaping Use  . Vaping Use: Never used  Substance Use Topics  . Alcohol use: Yes    Comment: occ  . Drug use: No    Home Medications Prior to Admission medications   Medication Sig  Start Date End Date Taking? Authorizing Provider  clindamycin (CLEOCIN) 300 MG capsule Take 1 capsule (300 mg total) by mouth 4 (four) times daily. X 7 days 03/30/20   Molpus, John, MD  oxyCODONE-acetaminophen (PERCOCET) 5-325 MG tablet Take 1 tablet by mouth every 6 (six) hours as needed for severe pain. 03/30/20   Molpus, John, MD    Allergies    Latex and Penicillins  Review of Systems   Review of Systems  Musculoskeletal: Positive for arthralgias and joint swelling. Negative for myalgias.  Neurological: Negative for weakness and numbness.    Physical Exam Updated Vital Signs BP (!) 165/80 (BP Location: Right Arm)   Pulse 70   Temp 98.7 F (37.1 C) (Oral)   Resp 16   Ht 5\' 2"  (1.575 m)   Wt 115.7 kg   SpO2 99%   BMI 46.64 kg/m   Physical Exam Vitals and nursing note reviewed.  Constitutional:      General: She is not in acute distress.  Appearance: She is well-developed.  HENT:     Head: Normocephalic and atraumatic.  Eyes:     Conjunctiva/sclera: Conjunctivae normal.  Cardiovascular:     Rate and Rhythm: Normal rate.     Heart sounds: No murmur heard.   Pulmonary:     Effort: Pulmonary effort is normal. No respiratory distress.  Abdominal:     General: There is no distension.  Musculoskeletal:     Cervical back: Neck supple.     Comments: Right middle finger notable for mild swelling and tenderness palpation  Skin:    General: Skin is warm and dry.  Neurological:     Mental Status: She is alert and oriented to person, place, and time.  Psychiatric:        Mood and Affect: Mood normal.        Behavior: Behavior normal.     ED Results / Procedures / Treatments   Labs (all labs ordered are listed, but only abnormal results are displayed) Labs Reviewed - No data to display  EKG None  Radiology DG Finger Middle Right  Result Date: 05/19/2020 CLINICAL DATA:  Finger pain and swelling after injury EXAM: RIGHT MIDDLE FINGER 2+V COMPARISON:  None.  FINDINGS: Cortical irregularity along the volar and proximal aspect of the mid phalanx best seen on the lateral view. Diffuse soft tissue stranding about the digit. IMPRESSION: Cortical irregularity along the volar and proximal aspect of the mid phalanx, likely representing a non displaced fracture recommend correlation with point tenderness. Electronically Signed   By: Maudry Mayhew MD   On: 05/19/2020 22:04    Procedures Procedures   Medications Ordered in ED Medications - No data to display  ED Course  I have reviewed the triage vital signs and the nursing notes.  Pertinent labs & imaging results that were available during my care of the patient were reviewed by me and considered in my medical decision making (see chart for details).    MDM Rules/Calculators/A&P                          Patient presents with injury to right middle finger.  DDx includes, fracture, strain, or sprain.  Consultants: none  Plain films reveal questionable fx.  Pt advised to follow up with PCP and/or orthopedics. Patient given finger splint while in ED, conservative therapy such as RICE recommended and discussed.   Patient will be discharged home & is agreeable with above plan. Returns precautions discussed. Pt appears safe for discharge.  Final Clinical Impression(s) / ED Diagnoses Final diagnoses:  Closed nondisplaced fracture of phalanx of right middle finger, unspecified phalanx, initial encounter    Rx / DC Orders ED Discharge Orders    None       Roxy Horseman, PA-C 05/20/20 0138    Gilda Crease, MD 05/20/20 [phone]

## 2020-05-20 NOTE — ED Notes (Signed)
Provider at bedside

## 2020-09-14 ENCOUNTER — Other Ambulatory Visit: Payer: Self-pay

## 2020-09-14 ENCOUNTER — Emergency Department (HOSPITAL_BASED_OUTPATIENT_CLINIC_OR_DEPARTMENT_OTHER): Payer: 59

## 2020-09-14 ENCOUNTER — Encounter (HOSPITAL_BASED_OUTPATIENT_CLINIC_OR_DEPARTMENT_OTHER): Payer: Self-pay | Admitting: *Deleted

## 2020-09-14 ENCOUNTER — Emergency Department (HOSPITAL_BASED_OUTPATIENT_CLINIC_OR_DEPARTMENT_OTHER)
Admission: EM | Admit: 2020-09-14 | Discharge: 2020-09-15 | Disposition: A | Discharge status: Home / Self Care | Payer: 59 | Attending: Emergency Medicine | Admitting: Emergency Medicine

## 2020-09-14 DIAGNOSIS — K219 Gastro-esophageal reflux disease without esophagitis: Secondary | ICD-10-CM | POA: Diagnosis not present

## 2020-09-14 DIAGNOSIS — Z87891 Personal history of nicotine dependence: Secondary | ICD-10-CM | POA: Diagnosis not present

## 2020-09-14 DIAGNOSIS — J45909 Unspecified asthma, uncomplicated: Secondary | ICD-10-CM | POA: Diagnosis not present

## 2020-09-14 DIAGNOSIS — I10 Essential (primary) hypertension: Secondary | ICD-10-CM | POA: Diagnosis not present

## 2020-09-14 DIAGNOSIS — K2900 Acute gastritis without bleeding: Secondary | ICD-10-CM | POA: Insufficient documentation

## 2020-09-14 DIAGNOSIS — Z91040 Latex allergy status: Secondary | ICD-10-CM | POA: Insufficient documentation

## 2020-09-14 DIAGNOSIS — R1013 Epigastric pain: Secondary | ICD-10-CM | POA: Diagnosis present

## 2020-09-14 LAB — URINALYSIS, ROUTINE W REFLEX MICROSCOPIC
Bilirubin Urine: NEGATIVE
Glucose, UA: NEGATIVE mg/dL
Hgb urine dipstick: NEGATIVE
Ketones, ur: NEGATIVE mg/dL
Leukocytes,Ua: NEGATIVE
Nitrite: NEGATIVE
Protein, ur: NEGATIVE mg/dL
Specific Gravity, Urine: 1.025 (ref 1.005–1.030)
pH: 6 (ref 5.0–8.0)

## 2020-09-14 LAB — CBC WITH DIFFERENTIAL/PLATELET
Abs Immature Granulocytes: 0.02 10*3/uL (ref 0.00–0.07)
Basophils Absolute: 0 10*3/uL (ref 0.0–0.1)
Basophils Relative: 0 %
Eosinophils Absolute: 0.2 10*3/uL (ref 0.0–0.5)
Eosinophils Relative: 2 %
HCT: 39.4 % (ref 36.0–46.0)
Hemoglobin: 12.7 g/dL (ref 12.0–15.0)
Immature Granulocytes: 0 %
Lymphocytes Relative: 42 %
Lymphs Abs: 3.9 10*3/uL (ref 0.7–4.0)
MCH: 26.8 pg (ref 26.0–34.0)
MCHC: 32.2 g/dL (ref 30.0–36.0)
MCV: 83.1 fL (ref 80.0–100.0)
Monocytes Absolute: 0.7 10*3/uL (ref 0.1–1.0)
Monocytes Relative: 8 %
Neutro Abs: 4.4 10*3/uL (ref 1.7–7.7)
Neutrophils Relative %: 48 %
Platelets: 378 10*3/uL (ref 150–400)
RBC: 4.74 MIL/uL (ref 3.87–5.11)
RDW: 13.2 % (ref 11.5–15.5)
WBC: 9.3 10*3/uL (ref 4.0–10.5)
nRBC: 0 % (ref 0.0–0.2)

## 2020-09-14 LAB — COMPREHENSIVE METABOLIC PANEL
ALT: 12 U/L (ref 0–44)
AST: 20 U/L (ref 15–41)
Albumin: 4 g/dL (ref 3.5–5.0)
Alkaline Phosphatase: 70 U/L (ref 38–126)
Anion gap: 6 (ref 5–15)
BUN: 10 mg/dL (ref 6–20)
CO2: 28 mmol/L (ref 22–32)
Calcium: 8.7 mg/dL — ABNORMAL LOW (ref 8.9–10.3)
Chloride: 104 mmol/L (ref 98–111)
Creatinine, Ser: 0.88 mg/dL (ref 0.44–1.00)
GFR, Estimated: >60 mL/min (ref >60)
Glucose, Bld: 76 mg/dL (ref 70–99)
Potassium: 4.2 mmol/L (ref 3.5–5.1)
Sodium: 138 mmol/L (ref 135–145)
Total Bilirubin: 0.1 mg/dL — ABNORMAL LOW (ref 0.3–1.2)
Total Protein: 7.9 g/dL (ref 6.5–8.1)

## 2020-09-14 LAB — PREGNANCY, URINE: Preg Test, Ur: NEGATIVE

## 2020-09-14 LAB — LIPASE, BLOOD: Lipase: 30 U/L (ref 11–51)

## 2020-09-14 MED ORDER — FAMOTIDINE IN NACL 20-0.9 MG/50ML-% IV SOLN
20.0000 mg | Freq: Once | INTRAVENOUS | Status: AC
  Administered 2020-09-14: 20 mg via INTRAVENOUS
  Filled 2020-09-14: qty 50

## 2020-09-14 MED ORDER — ONDANSETRON 4 MG PO TBDP
4.0000 mg | ORAL_TABLET | Freq: Once | ORAL | Status: AC | PRN
  Administered 2020-09-14: 4 mg via ORAL
  Filled 2020-09-14: qty 1

## 2020-09-14 MED ORDER — ACETAMINOPHEN 325 MG PO TABS
650.0000 mg | ORAL_TABLET | Freq: Once | ORAL | Status: AC
  Administered 2020-09-14: 650 mg via ORAL
  Filled 2020-09-14: qty 2

## 2020-09-14 MED ORDER — ALUM & MAG HYDROXIDE-SIMETH 200-200-20 MG/5ML PO SUSP
30.0000 mL | Freq: Once | ORAL | Status: AC
  Administered 2020-09-14: 30 mL via ORAL
  Filled 2020-09-14: qty 30

## 2020-09-14 MED ORDER — SUCRALFATE 1 G PO TABS: 1.0000 g | ORAL_TABLET | Freq: Three times a day (TID) | ORAL | 0 refills | Status: DC

## 2020-09-14 MED ORDER — OMEPRAZOLE 20 MG PO CPDR: 20.0000 mg | DELAYED_RELEASE_CAPSULE | Freq: Every day | ORAL | 0 refills | Status: DC

## 2020-09-14 MED ORDER — KETOROLAC TROMETHAMINE 15 MG/ML IJ SOLN
15.0000 mg | Freq: Once | INTRAMUSCULAR | Status: AC
  Administered 2020-09-14: 15 mg via INTRAVENOUS
  Filled 2020-09-14: qty 1

## 2020-09-14 NOTE — ED Provider Notes (Signed)
[facility] EMERGENCY DEPARTMENT Provider Note   CSN: [id number] Arrival date & time: 09/14/20  1727     History Chief Complaint  Patient presents with   Abdominal Pain    Sherri Reid is a 45 y.o. female  With a past medical history of hypertension presenting to the ED with a chief complaint of abdominal pain.  For the past month she has been experiencing intermittent epigastric pain, coughing and nonbloody, nonbilious emesis after eating.  She took some reflux medication that her friend gave her with only minimal improvement.  For the past week she reports a small amount of emesis daily.  Denies any changes to bowel movements or urination.  She will feel as though the food is stuck in her upper abdomen for a while.  Denies any prior abdominal surgeries, fever, chest pain or shortness of breath. HPI     Past Medical History:  Diagnosis Date   Asthma    History of syphilis    treated   Hypertension    Vaginal Pap smear, abnormal    f/u ok    Patient Active Problem List   Diagnosis Date Noted   Non-intractable vomiting    AKI (acute kidney injury) (HCC) 01/03/2018   Chronic anemia 01/03/2018   Obesity, Class III, BMI 40-49.9 (morbid obesity) (HCC)    Leukocytosis    Cellulitis of breast 01/02/2018    Past Surgical History:  Procedure Laterality Date   NO PAST SURGERIES       OB History     Gravida  1   Para  0   Term  0   Preterm  0   AB  1   Living  0      SAB  1   IAB  0   Ectopic  0   Multiple  0   Live Births              Family History  Problem Relation Age of Onset   Hyperlipidemia Mother    Mental illness Mother    Hypertension Mother    Diabetes Father    Cancer Maternal Aunt    Cancer Paternal Uncle    Anesthesia problems Neg Hx     Social History   Tobacco Use   Smoking status: Former    Packs/day: 1.00    Years: 22.00    Pack years: 22.00    Types: Cigarettes   Smokeless tobacco: Never  Vaping Use    Vaping Use: Never used  Substance Use Topics   Alcohol use: Yes    Comment: occ   Drug use: No    Home Medications Prior to Admission medications   Medication Sig Start Date End Date Taking? Authorizing Provider  omeprazole (PRILOSEC) 20 MG capsule Take 1 capsule (20 mg total) by mouth daily. 09/14/20  Yes Curties Conigliaro, PA-C  sucralfate (CARAFATE) 1 g tablet Take 1 tablet (1 g total) by mouth 4 (four) times daily -  with meals and at bedtime. 09/14/20  Yes Meaghan Whistler, PA-C  clindamycin (CLEOCIN) 300 MG capsule Take 1 capsule (300 mg total) by mouth 4 (four) times daily. X 7 days 03/30/20   Molpus, John, MD  oxyCODONE-acetaminophen (PERCOCET) 5-325 MG tablet Take 1 tablet by mouth every 6 (six) hours as needed for severe pain. 03/30/20   Molpus, Jonny Ruiz, MD    Allergies    Latex and Penicillins  Review of Systems   Review of Systems  Constitutional:  Negative for appetite  change, chills and fever.  HENT:  Negative for ear pain, rhinorrhea, sneezing and sore throat.   Eyes:  Negative for photophobia and visual disturbance.  Respiratory:  Negative for cough, chest tightness, shortness of breath and wheezing.   Cardiovascular:  Negative for chest pain and palpitations.  Gastrointestinal:  Positive for abdominal pain, nausea and vomiting. Negative for blood in stool, constipation and diarrhea.  Genitourinary:  Negative for dysuria, hematuria and urgency.  Musculoskeletal:  Negative for myalgias.  Skin:  Negative for rash.  Neurological:  Negative for dizziness, weakness and light-headedness.   Physical Exam Updated Vital Signs BP (!) 156/86 (BP Location: Left Arm)   Pulse 68   Temp 98.4 F (36.9 C) (Oral)   Resp 16   Ht 5\' 2"  (1.575 m)   Wt 113.4 kg   LMP 09/05/2020   SpO2 100%   BMI 45.73 kg/m   Physical Exam Vitals and nursing note reviewed.  Constitutional:      General: She is not in acute distress.    Appearance: She is well-developed.  HENT:     Head: Normocephalic and  atraumatic.     Nose: Nose normal.  Eyes:     General: No scleral icterus.       Left eye: No discharge.     Conjunctiva/sclera: Conjunctivae normal.  Cardiovascular:     Rate and Rhythm: Normal rate and regular rhythm.     Heart sounds: Normal heart sounds. No murmur heard.   No friction rub. No gallop.  Pulmonary:     Effort: Pulmonary effort is normal. No respiratory distress.     Breath sounds: Normal breath sounds.  Abdominal:     General: Bowel sounds are normal. There is no distension.     Palpations: Abdomen is soft.     Tenderness: There is no abdominal tenderness. There is no guarding.     Comments: Abdomen is soft, nontender nondistended.  Musculoskeletal:        General: Normal range of motion.     Cervical back: Normal range of motion and neck supple.  Skin:    General: Skin is warm and dry.     Findings: No rash.  Neurological:     Mental Status: She is alert.     Motor: No abnormal muscle tone.     Coordination: Coordination normal.    ED Results / Procedures / Treatments   Labs (all labs ordered are listed, but only abnormal results are displayed) Labs Reviewed  COMPREHENSIVE METABOLIC PANEL - Abnormal; Notable for the following components:      Result Value   Calcium 8.7 (*)    Total Bilirubin 0.1 (*)    All other components within normal limits  LIPASE, BLOOD  CBC WITH DIFFERENTIAL/PLATELET  URINALYSIS, ROUTINE W REFLEX MICROSCOPIC  PREGNANCY, URINE    EKG EKG Interpretation  Date/Time:  Friday September 14 2020 23:27:06 EDT Ventricular Rate:  65 PR Interval:  151 QRS Duration: 85 QT Interval:  444 QTC Calculation: 462 R Axis:   35 Text Interpretation: Sinus rhythm No significant change was found Confirmed by Glynn Octaveancour, Stephen [id number]) on 09/14/2020 11:29:26 PM  Radiology DG Abdomen Acute W/Chest  Result Date: 09/14/2020 CLINICAL DATA:  Epigastric pain. EXAM: DG ABDOMEN ACUTE WITH 1 VIEW CHEST COMPARISON:  CT abdomen and pelvis 07/14/2019.  FINDINGS: The lungs and costophrenic angles are clear. There is no pneumothorax. The cardiomediastinal silhouette is within normal limits. Bowel gas pattern is nonobstructive. Air seen to the level of the  rectum. There are no suspicious calcifications. The osseous structures are within normal limits. IMPRESSION: Negative abdominal radiographs.  No acute cardiopulmonary disease. Electronically Signed   By: Darliss Cheney M.D.   On: 09/14/2020 22:24    Procedures Procedures   Medications Ordered in ED Medications  ondansetron (ZOFRAN-ODT) disintegrating tablet 4 mg (4 mg Oral Given 09/14/20 1746)  famotidine (PEPCID) IVPB 20 mg premix (0 mg Intravenous Stopped 09/14/20 2320)  ketorolac (TORADOL) 15 MG/ML injection 15 mg (15 mg Intravenous Given 09/14/20 2247)  acetaminophen (TYLENOL) tablet 650 mg (650 mg Oral Given 09/14/20 2242)  alum & mag hydroxide-simeth (MAALOX/MYLANTA) 200-200-20 MG/5ML suspension 30 mL (30 mLs Oral Given 09/14/20 2242)    ED Course  I have reviewed the triage vital signs and the nursing notes.  Pertinent labs & imaging results that were available during my care of the patient were reviewed by me and considered in my medical decision making (see chart for details).    MDM Rules/Calculators/A&P                           45 year old female presenting to the ED for epigastric pain, coughing and emesis.  Symptoms have been intermittent for the past month.  They happen after she eats.  Minimal improvement noted with reflux medication given by her friend.  Denies any bloody emesis.  No bloody stools or changes to bowel movements.  No urinary symptoms.  No chest pain or shortness of breath.  No prior abdominal surgeries.  On exam abdomen is soft, nontender nondistended.  She is not currently experiencing any symptoms. Will obtain AXR to evaluate for structural cause and reassess.  Her lab work including CBC, CMP, lipase, urinalysis and urine pregnancy test are all  unremarkable. Abdominal x-ray is negative for acute abnormality.  EKG shows sinus rhythm, no ischemic changes, no STEMI.  After medications patient reports improvement in symptoms.  I suspect that her symptoms could be due to reflux.  Doubt cardiac or pulmonary cause based on her reassuring vital signs and physical exam findings as well as work-up here.  She has not seen GI yet so I feel that she will benefit from referral.  We will place her on a PPI and give food recommendations to help prevent further flareups of her reflux.  Doubt acute surgical cause such as cholecystitis, appendicitis.  Patient is agreeable to the plan.  Return precautions given.   Patient is hemodynamically stable, in NAD, and able to ambulate in the ED. Evaluation does not show pathology that would require ongoing emergent intervention or inpatient treatment. I explained the diagnosis to the patient. Pain has been managed and has no complaints prior to discharge. Patient is comfortable with above plan and is stable for discharge at this time. All questions were answered prior to disposition. Strict return precautions for returning to the ED were discussed. Encouraged follow up with PCP.   An After Visit Summary was printed and given to the patient.   Portions of this note were generated with Scientist, clinical (histocompatibility and immunogenetics). Dictation errors may occur despite best attempts at proofreading.  Final Clinical Impression(s) / ED Diagnoses Final diagnoses:  Acute gastritis without hemorrhage, unspecified gastritis type  Gastroesophageal reflux disease, unspecified whether esophagitis present    Rx / DC Orders ED Discharge Orders          Ordered    sucralfate (CARAFATE) 1 g tablet  3 times daily with meals & bedtime  09/14/20 2332    omeprazole (PRILOSEC) 20 MG capsule  Daily        09/14/20 2332             Dietrich Pates, PA-C 09/14/20 2333    Rolan Bucco, MD 09/19/20 1452

## 2020-09-14 NOTE — ED Triage Notes (Signed)
C/o epigastric pain with n/v x 30 mins after eating x 1 month

## 2020-09-14 NOTE — ED Notes (Signed)
Pt given popcorn, crackers and gatorade for PO challenge

## 2020-09-14 NOTE — Discharge Instructions (Addendum)
Take the medications as prescribed. Return to the ER if you start to experience worsening symptoms, continued vomiting, shortness of breath.

## 2020-10-04 ENCOUNTER — Encounter: Payer: 59 | Admitting: Obstetrics and Gynecology

## 2020-10-04 ENCOUNTER — Encounter: Payer: Self-pay | Admitting: Family Medicine

## 2020-10-04 ENCOUNTER — Encounter: Payer: Self-pay | Admitting: Obstetrics and Gynecology

## 2020-10-05 NOTE — Progress Notes (Signed)
Patient did not keep her GYN appointment for 10/04/2020.  Cornelia Copa MD Attending Center for [employer] [facility])

## 2020-11-12 ENCOUNTER — Encounter: Payer: Self-pay | Admitting: Family Medicine

## 2020-11-12 ENCOUNTER — Encounter: Payer: Self-pay | Admitting: Nurse Practitioner

## 2020-11-12 ENCOUNTER — Other Ambulatory Visit (HOSPITAL_COMMUNITY)
Admission: RE | Admit: 2020-11-12 | Discharge: 2020-11-12 | Disposition: A | Discharge status: Home / Self Care | Payer: 59 | Source: Ambulatory Visit | Attending: Nurse Practitioner | Admitting: Nurse Practitioner

## 2020-11-12 ENCOUNTER — Ambulatory Visit (INDEPENDENT_AMBULATORY_CARE_PROVIDER_SITE_OTHER): Payer: 59 | Admitting: Nurse Practitioner

## 2020-11-12 ENCOUNTER — Other Ambulatory Visit: Payer: Self-pay

## 2020-11-12 VITALS — BP 97/61 | HR 98 | Ht 62.0 in | Wt 271.2 lb

## 2020-11-12 DIAGNOSIS — N898 Other specified noninflammatory disorders of vagina: Secondary | ICD-10-CM

## 2020-11-12 DIAGNOSIS — Z1231 Encounter for screening mammogram for malignant neoplasm of breast: Secondary | ICD-10-CM | POA: Diagnosis not present

## 2020-11-12 DIAGNOSIS — Z01419 Encounter for gynecological examination (general) (routine) without abnormal findings: Secondary | ICD-10-CM

## 2020-11-12 NOTE — Patient Instructions (Signed)
AREA PEDIATRIC/FAMILY PRACTICE PHYSICIANS  Central/[place]outheast [place] ([id number]) [facility] Deirdre Priest, MD; Lum Babe, MD; Sheffield Slider, MD; Leveda Anna, MD; McDiarmid, MD; Jerene Bears, MD; Jennette Kettle, MD; Gwendolyn Grant, MD [address] [place], [place] [address] [phone] Mon-Fri 8:30-12:30, 1:30-5:00 Providers come to see babies at [facility] Accepting [facility] Family [facility] at [facility] providers who accept newborns: Docia Chuck, MD; Kateri Plummer, MD; Paulino Rily, MD [address], [place], [place] [address] [phone] Mon-Fri 8:00-5:30 Babies seen by providers at [facility] Does NOT accept Medicaid Please call early in hospitalization for appointment (limited availability)  [facility], MD [address] [place], [place] [address] [phone] Mon, Tue, Thur, Fri 8:30-5:00, Wed 10:00-7:00 (closed 1-2pm) Babies seen by [facility] providers Accepting [facility] [place] [address], [place], [place]  [address] Phone - [phone]   Fax - [phone]   East/[place]ortheast [place] [phone]) [facility] [place], NP; Lynelle Doctor, MD; Susann Givens, MD; [place], [place] [address] [place], [place] [address] [phone] Mon-Fri 8:00-5:00 Babies seen by providers at [facility] Does NOT accept Medicaid/Commercial Insurance Only [place] [phone]) [facility] at Lutricia Feil, [place]; [place], MD; [place], [place]; Wynelle Link, MD; Azucena Cecil, MD [address], [place], [place] [address] [phone] Mon-Fri 8:00-5:00 Babies seen by providers at [facility] Does NOT accept Medicaid Only accepting babies of parents who are patients Please call early in hospitalization for appointment (limited availability)  [facility] [phone]) [facility] providers accepting new patients: Drema Pry, NP; Delena Serve, PA [address] [place], [place]  [address] [phone] Mon-Fri 8:00-5:00 Babies seen by providers at [facility] Does NOT accept Medicaid Only accepting babies of parents who are patients Please call early in hospitalization for appointment (limited availability) Nature conservation officer at Verdell Carmine, MD; [place], MD; Hassan Rowan, MD [address] [place], [place], [place] [address] [phone] Mon-Fri 8:00-5:00 Babies seen by [facility] providers Does NOT accept Medicaid [facility] Boykin Peek, MD; Durene Cal, MD; [place], [place] [address] [place], [place] [address] [phone] Mon-Fri 8:00-5:00 Babies seen by [facility] providers Does NOT accept [facility] [place], [place]; [place], [place]; [place], NP; Avis Epley, MD; Vonna Kotyk, MD; Clance Boll, MD; Stevphen Rochester, NP; Arvilla Market, NP; Ann Maki, NP; Otis Dials, NP; Vaughan Basta, MD; Eartha Inch, MD [address] [place], [place] [address] [phone] Mon-Fri 8:30-5:00, Sat 10:00-1:00 Providers come to see babies at [facility] Does NOT accept Medicaid Free prenatal information session Tuesdays at 4:45pm [facility] [place], MD; [place], [place]; [place], [place]; [place], [place] [address] [place] [address] [phone] Mon-Fri 7:30-5:30 Babies seen by [facility] providers [facility] Doctor [address], [place], [place]  [address] [phone]   Fax - [phone]  [place] [phone] & [phone]) [facility], MD [address] [place], [place] [address] [phone] Mon-Thur 8:00-6:00 Providers come to see babies at [facility] Accepting Medicaid [facility] Dareen Piano, NP; Cyndia Bent, MD; [place], [place]; [place], [place] [address] [place], [place] [address] [phone] Mon-Thur 7:30-7:30, Fri 7:30-4:30 Babies seen by [facility] providers Accepting [facility] Juanito Doom, MD; Janene Harvey, NP; Vonita Moss, MD [address], [place], [place] [address] [phone] Mon-Fri 8:30-5:00, Sat 8:30-12:00 Providers come to see babies at [facility] Accepting Medicaid Must have "Meet & Greet" appointment at office prior to delivery [facility] ([facility]) Marlow Baars, MD; Earlene Plater, MD; Lucretia Roers, MD [address], [place], [place]  [address] [phone] Mon-Wed 8:00-6:00, Thur-Fri 8:00-5:00, Sat 9:00-12:00 Providers come to see babies at [facility] Does NOT accept Medicaid Only accepting siblings of current patients [facility]  [address], [place], [place]  [address] [phone]   Fax - [phone] [facility] Medicine at [facility] [address] [place], [place]  [address] [phone]   Fax - [phone]  [place] [phone] & [phone]) Adult nurse [facility] [place], [place]; [place], [place] [address] [place], [place] [address] [phone] Mon-Fri 7:00-5:00 Babies seen by [facility] providers Does NOT accept Medicaid [facility] [place], MD; [place], [place]; [place], [place] [address], [place], [place] [address] [phone] Mon-Fri 8:00-5:00 Babies seen by [facility] providers Accepting [facility] - Dorann Lodge Floral, MD; [place], [place]; [place], NP; [place], [place] [address] [place], [place], [place] [address] [phone] Mon-Fri 8:00-5:00 Babies seen by providers at [facility] Accepting [facility] [place] [phone]) [facility] [place], [place] [address] [place], [place] [address] [phone] Mon-Fri 8:00-5:00 Babies seen by [facility] providers Does NOT accept Medicaid Limited availability, please call early in hospitalization to schedule follow-up [facility] Jeanelle Malling, [place];  Eddie Candle, MD; Normand Sloop, MD; [place], [place]; Constance Goltz, MD; [place], [place] [address] [facility] [address], [place], [place] [address] [phone] Mon-Fri 8:30-5:00, Sat 9:00-12:00 Babies seen by providers at [facility] Accepting Medicaid Please register online then schedule online or call office [email or web address] - [facility] at [facility]) Durene Cal, [place]; Lucianne Muss, MD; [place], [place] [address], [place], [place] [address] [phone] Mon-Fri 8:00-5:00 Babies seen by providers at [facility] [place] Accepting [facility] ([facility]) [place], MD; Reed Breech, NP; Shelva Majestic, MD [address], [place], [place] [address] [phone] Mon-Fri 8:00-5:30, Sat&Sun by appointment (phones open at 8:30) Babies seen by [facility] providers Accepting Medicaid Must be a first-time baby or sibling of current patient [facility]  [address], [place], [place]  [address] [phone]   Fax - [phone]  [place] [phone] & [phone]) [facility] [place], [place]; [place], [place]; [place], MD; [place], [place]; [place], MD [address] [place], [place] [address] [phone] Mon-Thur 8:00-7:00, Fri 8:00-5:00, Sat 8:00-12:00, Sun 9:00-12:00 Babies seen by [facility] providers Accepting Medicaid [facility] Family Medicine at Liana Gerold, MD; Gaynell Face, MD; [facility], MD [address], [place], [place] [address] [phone] Mon-Thur 8:00-5:00 Babies seen by providers at [facility] Accepting Medicaid [facility] Family Medicine at Dorthey Sawyer, MD; Coe[place], MD; Madilyn Fireman, MD; Melvyn Neth, MD; List, MD; Lazarus Salines, MD; Gaynell Face, MD; Berneda Rose, MD; Flora Lipps, MD; Beryl Meager, MD; Luther Redo, MD; Lavonia Drafts, MD; Kellie Simmering, MD [address] Sherian Maroon [place], [place] [address] [phone] Mon-Fri 8:00-5:30, Sat  (Oct.-Mar.) 9:00-1:00 Babies seen by providers at [facility] Accepting Medicaid Must fill out new patient packet, available online at [email or web address] at [facility]) Spero Geralds, NP; Tiburcio Pea, NP; Tresa Endo, NP; Whitney Post, MD; [place], [place]; Hennie Duos, MD; [place], MD; Kavin Leech, NP [address], [place], [place] [address] [phone] Mon-Thur 8:00-5:30, Fri 8:00-5:00 Babies seen by providers at [facility] Accepting [facility]  [place] [phone]) [facility] [place], [place]; [place], MD; [place], MD; [place], [place] [address] [place], [place]  [place], [place] [address] [phone] Mon-Fri 8:00-5:00 Babies seen by providers at [facility] Accepting [facility]   [place] [phone]) [facility], [place]; Lenise Arena, MD; [place], [place] [address], [place], [place] [address] [phone] Mon-Fri 8:00-5:00 Babies seen by providers at [facility] Does NOT accept Medicaid Limited appointment availability, please call early in hospitalization  [facility] [address] Alejandro Otero [place], [place]; [place], MD [address] [place], [place] [address] [phone] Mon-Fri 8:00-5:00 Babies seen by [facility] providers Does NOT accept [facility] - [facility], MD; Ninetta Lights, MD; [place], [place]; [place], MD [address], [place], [place] [address] [phone] Mon-Fri 8:00-5:00 After hours clinic [facility] [place] [place], [place] [address]) [phone] Mon-Fri 5:00-8:00, Sat 12:00-6:00, Sun 10:00-4:00 Babies seen by [facility] providers Accepting [facility] Family Medicine at [facility] [address] [place], [place]  [address] [phone]   Fax - [phone]  [place] [phone]) Adult nurse Health[facility] at [facility], MD [address] [place], [place], [place] [address] [phone] Mon-Fri 8:00-5:00 Babies seen by  [facility] providers Does NOT accept Medicaid [facility] - [place] [facility] [place]) Rene Kocher, MD [address] [place], [place] [address] [phone] Mon-Thur 8:00-7:00, Fri 8:00-5:00, Sat 8:00-12:00 Babies seen by providers at [facility] Accepting Medicaid - but does not have vaccinations in office (must be received elsewhere) Limited availability, please call early in hospitalization  [place] [phone]) [facility]  Wyvonne Lenz, MD [address] [place] [address] [phone]  Fax [phone]  [facility]  Lyndel Safe, MD, [place], [place], [place], [place] [address] [place], [place] [address] [phone] [facility]  [address] Sherian Maroon [place], [place] [address] [phone] [address] [place], [place] [address] [phone] [facility] Office)  [facility] [address] [place], [place] [address] [phone] Phineas Real [facility] [address] [place], [place], [place] [address] [phone] [facility] [address], [place], [place] [address] [phone] [facility] [address] [place], [place] [address] [phone] [facility] [address], [place], [place] [address] [phone] [facility] [address], [place], [place] [address] [phone] [facility]  [address] [place], [place] [address] [phone] Dr. Belia Heman. [place] [address] [place], [place] [address] [phone] [facility] [address], [place], [place] [address] [phone] [facility] [address] [place], [place] [address] [phone]

## 2020-11-12 NOTE — Progress Notes (Signed)
GYNECOLOGY ANNUAL PREVENTATIVE CARE ENCOUNTER NOTE  Subjective:   Sherri Reid is a 45 y.o. G80P0010 female here for a routine annual gynecologic exam.  Current complaints: was worried about brown blood rather than red blood with normal menses, and vaginal odor noted at least 2 weeks.   Denies abnormal vaginal bleeding, discharge, pelvic pain, problems with intercourse or other gynecologic concerns.    Gynecologic History Patient's last menstrual period was 10/28/2020 (exact date). Contraception: none Has female partner Last Pap: unknown.  Last mammogram: 4 years ago. Results were: normal  Obstetric History OB History  Gravida Para Term Preterm AB Living  1 0 0 0 1 0  SAB IAB Ectopic Multiple Live Births  1 0 0 0      # Outcome Date GA Lbr Len/2nd Weight Sex Delivery Anes PTL Lv  1 SAB             Past Medical History:  Diagnosis Date   Asthma    History of syphilis    treated   Hypertension    Vaginal Pap smear, abnormal    f/u ok    Past Surgical History:  Procedure Laterality Date   NO PAST SURGERIES      Current Outpatient Medications on File Prior to Visit  Medication Sig Dispense Refill   clindamycin (CLEOCIN) 300 MG capsule Take 1 capsule (300 mg total) by mouth 4 (four) times daily. X 7 days (Patient not taking: Reported on 11/12/2020) 28 capsule 0   omeprazole (PRILOSEC) 20 MG capsule Take 1 capsule (20 mg total) by mouth daily. (Patient not taking: Reported on 11/12/2020) 30 capsule 0   oxyCODONE-acetaminophen (PERCOCET) 5-325 MG tablet Take 1 tablet by mouth every 6 (six) hours as needed for severe pain. (Patient not taking: Reported on 11/12/2020) 12 tablet 0   sucralfate (CARAFATE) 1 g tablet Take 1 tablet (1 g total) by mouth 4 (four) times daily -  with meals and at bedtime. (Patient not taking: Reported on 11/12/2020) 30 tablet 0   No current facility-administered medications on file prior to visit.    Allergies  Allergen Reactions   Latex Swelling    Penicillins Hives, Swelling and Other (See Comments)    Has patient had a PCN reaction causing immediate rash, facial/tongue/throat swelling, SOB or lightheadedness with hypotension: Yes Has patient had a PCN reaction causing severe rash involving mucus membranes or skin necrosis: No Has patient had a PCN reaction that required hospitalization: No Has patient had a PCN reaction occurring within the last 10 years: No If all of the above answers are "NO", then may proceed with Cephalosporin use.     Social History   Socioeconomic History   Marital status: Single    Spouse name: Not on file   Number of children: Not on file   Years of education: Not on file   Highest education level: Not on file  Occupational History   Not on file  Tobacco Use   Smoking status: Former    Packs/day: 1.00    Years: 22.00    Pack years: 22.00    Types: Cigarettes   Smokeless tobacco: Never  Vaping Use   Vaping Use: Never used  Substance and Sexual Activity   Alcohol use: Yes    Comment: occ   Drug use: No   Sexual activity: Not Currently    Birth control/protection: None  Other Topics Concern   Not on file  Social History Narrative   Not on file  Social Determinants of Health   Financial Resource Strain: Not on file  Food Insecurity: Not on file  Transportation Needs: Not on file  Physical Activity: Not on file  Stress: Not on file  Social Connections: Not on file  Intimate Partner Violence: Not on file    Family History  Problem Relation Age of Onset   Hyperlipidemia Mother    Mental illness Mother    Hypertension Mother    Diabetes Father    Cancer Maternal Aunt    Cancer Paternal Uncle    Anesthesia problems Neg Hx     The following portions of the patient's history were reviewed and updated as appropriate: allergies, current medications, past family history, past medical history, past social history, past surgical history and problem list.  Review of Systems Pertinent  items noted in HPI and remainder of comprehensive ROS otherwise negative.   Objective:  BP 97/61   Pulse 98   Ht 5\' 2"  (1.575 m)   Wt 271 lb 3.2 oz (123 kg)   LMP 10/28/2020 (Exact Date)   BMI 49.60 kg/m  CONSTITUTIONAL: Well-developed, well-nourished female in no acute distress.  HENT:  Normocephalic, atraumatic, External right and left ear normal.  EYES: Conjunctivae and EOM are normal. Pupils are equal, round.  No scleral icterus.  NECK: Normal range of motion, supple, no masses.  Normal thyroid.  SKIN: Skin is warm and dry. No rash noted. Not diaphoretic. No erythema. No pallor. NEUROLOGIC: Alert and oriented to person, place, and time. Normal reflexes, muscle tone coordination. No cranial nerve deficit noted. PSYCHIATRIC: Normal mood and affect. Normal behavior. Normal judgment and thought content. CARDIOVASCULAR: Normal heart rate noted, regular rhythm RESPIRATORY: Clear to auscultation bilaterally. Effort and breath sounds normal, no problems with respiration noted. BREASTS: Symmetric in size. No masses, nipple drainage, or lymphadenopathy. On right breast 6cm area overlapping the areola hyperpigmented and dry - reports this skin change has been there since she had a breast cellulitis ABDOMEN: Soft, no distention noted.  No tenderness, rebound or guarding.  PELVIC: Normal appearing external genitalia; normal appearing vaginal mucosa and cervix.  No abnormal discharge noted.  Pap smear obtained.  Normal uterine size, no other palpable masses, no uterine or adnexal tenderness. MUSCULOSKELETAL: Normal range of motion. No tenderness.  No cyanosis, clubbing, or edema.    Assessment and Plan:  1. Annual GYN exam Advised weight loss with healthy eating and increasing exercise Normal BP Will check for infection causing vaginal odor Declines blood draw today Given info on finding PCP in [place] today  - Cytology - PAP( [place]) - Cervicovaginal ancillary only( [place]) - Hepatitis  B Surface AntiGEN - Hepatitis C Antibody - HIV antibody (with reflex)  2. Vaginal odor   3. Screening mammogram for breast cancer Will schedule - MM 3D SCREEN BREAST BILATERAL; Future  Will follow up results of pap smear and manage accordingly. Mammogram scheduled Routine preventative health maintenance measures emphasized. Please refer to After Visit Summary for other counseling recommendations.    10/30/2020, RN, MSN, NP-BC Nurse Practitioner, [facility]

## 2020-11-13 LAB — CERVICOVAGINAL ANCILLARY ONLY
Bacterial Vaginitis (gardnerella): NEGATIVE
Candida Glabrata: NEGATIVE
Candida Vaginitis: NEGATIVE
Comment: NEGATIVE
Comment: NEGATIVE
Comment: NEGATIVE

## 2020-11-13 LAB — HIV ANTIBODY (ROUTINE TESTING W REFLEX): HIV Screen 4th Generation wRfx: NONREACTIVE

## 2020-11-13 LAB — HEPATITIS B SURFACE ANTIGEN: Hepatitis B Surface Ag: NEGATIVE

## 2020-11-13 LAB — HEPATITIS C ANTIBODY: Hep C Virus Ab: <0.1 s/co ratio (ref 0.0–0.9)

## 2020-11-14 LAB — CYTOLOGY - PAP
Chlamydia: NEGATIVE
Comment: NEGATIVE
Comment: NEGATIVE
Comment: NEGATIVE
Comment: NORMAL
Diagnosis: NEGATIVE
High risk HPV: NEGATIVE
Neisseria Gonorrhea: NEGATIVE
Trichomonas: NEGATIVE

## 2020-11-17 ENCOUNTER — Ambulatory Visit
Admission: RE | Admit: 2020-11-17 | Discharge: 2020-11-17 | Disposition: A | Discharge status: Home / Self Care | Payer: 59 | Source: Ambulatory Visit | Attending: Nurse Practitioner | Admitting: Nurse Practitioner

## 2020-11-17 DIAGNOSIS — Z1231 Encounter for screening mammogram for malignant neoplasm of breast: Secondary | ICD-10-CM

## 2020-11-22 ENCOUNTER — Other Ambulatory Visit: Payer: Self-pay | Admitting: Nurse Practitioner

## 2020-11-22 DIAGNOSIS — R928 Other abnormal and inconclusive findings on diagnostic imaging of breast: Secondary | ICD-10-CM

## 2020-12-27 ENCOUNTER — Ambulatory Visit: Payer: 59

## 2020-12-27 ENCOUNTER — Ambulatory Visit
Admission: RE | Admit: 2020-12-27 | Discharge: 2020-12-27 | Disposition: A | Discharge status: Home / Self Care | Payer: 59 | Source: Ambulatory Visit | Attending: Nurse Practitioner | Admitting: Nurse Practitioner

## 2020-12-27 DIAGNOSIS — R928 Other abnormal and inconclusive findings on diagnostic imaging of breast: Secondary | ICD-10-CM

## 2021-01-03 ENCOUNTER — Other Ambulatory Visit: Payer: 59

## 2021-01-21 ENCOUNTER — Other Ambulatory Visit: Payer: Self-pay

## 2021-01-21 ENCOUNTER — Encounter (HOSPITAL_BASED_OUTPATIENT_CLINIC_OR_DEPARTMENT_OTHER): Payer: Self-pay

## 2021-01-21 ENCOUNTER — Emergency Department (HOSPITAL_BASED_OUTPATIENT_CLINIC_OR_DEPARTMENT_OTHER)
Admission: EM | Admit: 2021-01-21 | Discharge: 2021-01-21 | Disposition: A | Discharge status: Home / Self Care | Payer: Managed Care, Other (non HMO) | Attending: Emergency Medicine | Admitting: Emergency Medicine

## 2021-01-21 DIAGNOSIS — K0889 Other specified disorders of teeth and supporting structures: Secondary | ICD-10-CM | POA: Diagnosis present

## 2021-01-21 DIAGNOSIS — K029 Dental caries, unspecified: Secondary | ICD-10-CM | POA: Diagnosis not present

## 2021-01-21 DIAGNOSIS — Z91040 Latex allergy status: Secondary | ICD-10-CM | POA: Insufficient documentation

## 2021-01-21 MED ORDER — HYDROCODONE-ACETAMINOPHEN 5-325 MG PO TABS
1.0000 | ORAL_TABLET | Freq: Once | ORAL | Status: AC
  Administered 2021-01-21: 1 via ORAL
  Filled 2021-01-21: qty 1

## 2021-01-21 MED ORDER — KETOROLAC TROMETHAMINE 15 MG/ML IJ SOLN
15.0000 mg | Freq: Once | INTRAMUSCULAR | Status: AC
  Administered 2021-01-21: 15 mg via INTRAMUSCULAR
  Filled 2021-01-21: qty 1

## 2021-01-21 MED ORDER — HYDROCODONE-ACETAMINOPHEN 5-325 MG PO TABS: 1.0000 | ORAL_TABLET | Freq: Four times a day (QID) | ORAL | 0 refills | Status: DC | PRN

## 2021-01-21 MED ORDER — CLINDAMYCIN HCL 150 MG PO CAPS: 300.0000 mg | ORAL_CAPSULE | Freq: Three times a day (TID) | ORAL | 0 refills | Status: DC

## 2021-01-21 NOTE — ED Triage Notes (Signed)
Pt c/o left lower dental pain x "couple months"-NAD-steady gait

## 2021-01-21 NOTE — Discharge Instructions (Signed)
As we discussed you need to get into see the dentist to help remove the tooth that is causing this pain.  The antibiotics and pain medication that providing should help provide you some relief until he can get into see the dentist.  He cannot get into see dentist on March, he may want to consider seeing a dentist that is outside of your insurance so that you can get relief sooner.

## 2021-01-21 NOTE — ED Provider Notes (Signed)
[facility] EMERGENCY DEPARTMENT Provider Note   CSN: [id number] Arrival date & time: 01/21/21  1133     History  Chief Complaint  Patient presents with   Dental Pain    Sherri Reid is a 46 y.o. female with no sniffing past medical history who presents with several months of left lower dental pain.  Patient with known cavity in that area.  Patient reports that she is having difficulty getting into a dentist, the one that is covered by her insurance does not have any appointments until March.  Patient reports that she has taken ibuprofen 800 mg with minimal relief.   Dental Pain     Home Medications Prior to Admission medications   Medication Sig Start Date End Date Taking? Authorizing Provider  clindamycin (CLEOCIN) 150 MG capsule Take 2 capsules (300 mg total) by mouth 3 (three) times daily. 01/21/21  Yes Luvenia Cranford H, PA-C  HYDROcodone-acetaminophen (NORCO/VICODIN) 5-325 MG tablet Take 1-2 tablets by mouth every 6 (six) hours as needed. 01/21/21  Yes Bartosz Luginbill H, PA-C  omeprazole (PRILOSEC) 20 MG capsule Take 1 capsule (20 mg total) by mouth daily. Patient not taking: Reported on 11/12/2020 09/14/20   Dietrich Pates, PA-C  oxyCODONE-acetaminophen (PERCOCET) 5-325 MG tablet Take 1 tablet by mouth every 6 (six) hours as needed for severe pain. Patient not taking: Reported on 11/12/2020 03/30/20   Molpus, Jonny Ruiz, MD  sucralfate (CARAFATE) 1 g tablet Take 1 tablet (1 g total) by mouth 4 (four) times daily -  with meals and at bedtime. Patient not taking: Reported on 11/12/2020 09/14/20   Dietrich Pates, PA-C      Allergies    Latex and Penicillins    Review of Systems   Review of Systems  HENT:  Positive for dental problem.   All other systems reviewed and are negative.  Physical Exam Updated Vital Signs BP (!) 159/93 (BP Location: Left Arm)    Pulse 66    Temp 98.3 F (36.8 C) (Oral)    Resp 18    Ht 5\' 2"  (1.575 m)    Wt 122.5 kg    LMP 01/11/2021     SpO2 100%    BMI 49.38 kg/m  Physical Exam Vitals and nursing note reviewed.  Constitutional:      General: She is not in acute distress.    Appearance: Normal appearance.  HENT:     Head: Normocephalic and atraumatic.     Mouth/Throat:     Comments: Patient with obvious appearance of large dental carry in the last molar on the left side.  She has no evidence of a periapical abscess, or abscess of the gums.  There is no floor of mouth swelling.  Patient has patent airway, clear posterior pharynx.  Uvula is midline. Eyes:     General:        Right eye: No discharge.        Left eye: No discharge.  Neck:     Comments: There is some reactive lymphadenopathy on the left, however full range of motion intact of the cervical neck, no evidence of significant swelling.  There is some tenderness to palpation of the floor of mouth Cardiovascular:     Rate and Rhythm: Normal rate and regular rhythm.  Pulmonary:     Effort: Pulmonary effort is normal. No respiratory distress.  Musculoskeletal:        General: No deformity.     Cervical back: Neck supple. No rigidity.  Skin:  General: Skin is warm and dry.  Neurological:     Mental Status: She is alert and oriented to person, place, and time.  Psychiatric:        Mood and Affect: Mood normal.        Behavior: Behavior normal.    ED Results / Procedures / Treatments   Labs (all labs ordered are listed, but only abnormal results are displayed) Labs Reviewed - No data to display  EKG None  Radiology No results found.  Procedures Procedures    Medications Ordered in ED Medications  ketorolac (TORADOL) 15 MG/ML injection 15 mg (15 mg Intramuscular Given 01/21/21 1327)  HYDROcodone-acetaminophen (NORCO/VICODIN) 5-325 MG per tablet 1 tablet (1 tablet Oral Given 01/21/21 1329)    ED Course/ Medical Decision Making/ A&P                           Medical Decision Making  Is a well-appearing patient with known dental pain who  presents for ongoing dental pain on the left.  She reports that she is unable to get to the dentist until March.  She has been using ibuprofen 800 with minimal relief.  On my evaluation I noted she has obvious dental carry on the left, but patient has no signs or symptoms of abscess, Ludwig angina, peritonsillar abscess, and her uvula is midline.  There is some tenderness to palpation, reactive lymphadenopathy.  In context of ongoing pain, and reactive lymphadenopathy we will prescribe clindamycin for 1 week, as well as encouraged alternating iburofen, Tylenol, and will add Norco for additional pain relief.  Encouraged to follow-up with a dentist at her earliest convenience.  Patient discharged in stable condition at this time. Final Clinical Impression(s) / ED Diagnoses Final diagnoses:  Pain, dental    Rx / DC Orders ED Discharge Orders          Ordered    HYDROcodone-acetaminophen (NORCO/VICODIN) 5-325 MG tablet  Every 6 hours PRN        01/21/21 1325    clindamycin (CLEOCIN) 150 MG capsule  3 times daily        01/21/21 1325              Caellum Mancil, [place] H, PA-C 01/21/21 1349    Lennice Sites, DO 01/21/21 1538

## 2021-04-18 ENCOUNTER — Other Ambulatory Visit: Payer: Self-pay

## 2021-04-18 ENCOUNTER — Emergency Department (HOSPITAL_BASED_OUTPATIENT_CLINIC_OR_DEPARTMENT_OTHER): Payer: Managed Care, Other (non HMO)

## 2021-04-18 ENCOUNTER — Encounter (HOSPITAL_BASED_OUTPATIENT_CLINIC_OR_DEPARTMENT_OTHER): Payer: Self-pay | Admitting: Emergency Medicine

## 2021-04-18 ENCOUNTER — Emergency Department (HOSPITAL_BASED_OUTPATIENT_CLINIC_OR_DEPARTMENT_OTHER)
Admission: EM | Admit: 2021-04-18 | Discharge: 2021-04-18 | Disposition: A | Discharge status: Home / Self Care | Payer: Managed Care, Other (non HMO) | Attending: Emergency Medicine | Admitting: Emergency Medicine

## 2021-04-18 DIAGNOSIS — Z91040 Latex allergy status: Secondary | ICD-10-CM | POA: Insufficient documentation

## 2021-04-18 DIAGNOSIS — R0789 Other chest pain: Secondary | ICD-10-CM | POA: Diagnosis not present

## 2021-04-18 DIAGNOSIS — R079 Chest pain, unspecified: Secondary | ICD-10-CM | POA: Diagnosis present

## 2021-04-18 LAB — CBC WITH DIFFERENTIAL/PLATELET
Abs Immature Granulocytes: 0.01 10*3/uL (ref 0.00–0.07)
Basophils Absolute: 0 10*3/uL (ref 0.0–0.1)
Basophils Relative: 0 %
Eosinophils Absolute: 0.1 10*3/uL (ref 0.0–0.5)
Eosinophils Relative: 1 %
HCT: 32.1 % — ABNORMAL LOW (ref 36.0–46.0)
Hemoglobin: 10.7 g/dL — ABNORMAL LOW (ref 12.0–15.0)
Immature Granulocytes: 0 %
Lymphocytes Relative: 38 %
Lymphs Abs: 2.7 10*3/uL (ref 0.7–4.0)
MCH: 26.8 pg (ref 26.0–34.0)
MCHC: 33.3 g/dL (ref 30.0–36.0)
MCV: 80.5 fL (ref 80.0–100.0)
Monocytes Absolute: 0.5 10*3/uL (ref 0.1–1.0)
Monocytes Relative: 7 %
Neutro Abs: 3.7 10*3/uL (ref 1.7–7.7)
Neutrophils Relative %: 54 %
Platelets: 321 10*3/uL (ref 150–400)
RBC: 3.99 MIL/uL (ref 3.87–5.11)
RDW: 13.4 % (ref 11.5–15.5)
WBC: 7 10*3/uL (ref 4.0–10.5)
nRBC: 0 % (ref 0.0–0.2)

## 2021-04-18 LAB — HEPATIC FUNCTION PANEL
ALT: 13 U/L (ref 0–44)
AST: 22 U/L (ref 15–41)
Albumin: 3.7 g/dL (ref 3.5–5.0)
Alkaline Phosphatase: 57 U/L (ref 38–126)
Bilirubin, Direct: <0.1 mg/dL (ref 0.0–0.2)
Total Bilirubin: 0.4 mg/dL (ref 0.3–1.2)
Total Protein: 7.4 g/dL (ref 6.5–8.1)

## 2021-04-18 LAB — TROPONIN I (HIGH SENSITIVITY): Troponin I (High Sensitivity): 4 ng/L (ref <18)

## 2021-04-18 LAB — BASIC METABOLIC PANEL
Anion gap: 8 (ref 5–15)
BUN: 14 mg/dL (ref 6–20)
CO2: 21 mmol/L — ABNORMAL LOW (ref 22–32)
Calcium: 8.6 mg/dL — ABNORMAL LOW (ref 8.9–10.3)
Chloride: 109 mmol/L (ref 98–111)
Creatinine, Ser: 0.92 mg/dL (ref 0.44–1.00)
GFR, Estimated: >60 mL/min (ref >60)
Glucose, Bld: 76 mg/dL (ref 70–99)
Potassium: 3.8 mmol/L (ref 3.5–5.1)
Sodium: 138 mmol/L (ref 135–145)

## 2021-04-18 LAB — LIPASE, BLOOD: Lipase: 30 U/L (ref 11–51)

## 2021-04-18 MED ORDER — CYCLOBENZAPRINE HCL 10 MG PO TABS: 10.0000 mg | ORAL_TABLET | Freq: Two times a day (BID) | ORAL | 0 refills | Status: DC | PRN

## 2021-04-18 NOTE — ED Triage Notes (Signed)
Left rib pain  x 3 weeks and then the last week she  has had sob ,pain sharp uner left breast pain and comes  and goes ?

## 2021-04-18 NOTE — Discharge Instructions (Addendum)
Recommend 400 mg ibuprofen every 8 hours, 1000 mg of Tylenol every 6 hours.  Use Flexeril as a muscle relaxant.  Do not mix with alcohol, drugs or other dangerous activities including driving as this medication is slightly sedating. ?

## 2021-04-18 NOTE — ED Provider Notes (Signed)
?[facility] EMERGENCY DEPARTMENT ?Provider Note ? ? ?CSN: [id number] ?Arrival date & time: 04/18/21  1744 ? ?  ? ?History ? ?Chief Complaint  ?Patient presents with  ? Chest Pain  ? ? ?Sherri Reid is a 46 y.o. female. ? ?The history is provided by the patient.  ?Chest Pain ?Pain location:  L chest ?Pain quality: aching   ?Pain radiates to:  Does not radiate ?Pain severity:  Mild ?Onset quality:  Gradual ?Duration:  3 weeks ?Timing:  Intermittent ?Progression:  Waxing and waning ?Chronicity:  New ?Context: lifting and raising an arm   ?Relieved by:  Nothing ?Worsened by:  Nothing ?Associated symptoms: no abdominal pain, no altered mental status, no anorexia, no anxiety, no back pain, no claudication, no cough, no diaphoresis, no dizziness, no dysphagia, no fatigue, no fever, no headache, no heartburn, no lower extremity edema, no nausea, no near-syncope, no numbness, no orthopnea, no palpitations, no PND, no shortness of breath, no syncope, no vomiting and no weakness   ?Risk factors: no birth control, no coronary artery disease, no diabetes mellitus, no high cholesterol, no hypertension, no prior DVT/PE and no smoking   ? ?  ? ?Home Medications ?Prior to Admission medications   ?Medication Sig Start Date End Date Taking? Authorizing Provider  ?cyclobenzaprine (FLEXERIL) 10 MG tablet Take 1 tablet (10 mg total) by mouth 2 (two) times daily as needed for muscle spasms. 04/18/21  Yes Jestina Stephani, DO  ?clindamycin (CLEOCIN) 150 MG capsule Take 2 capsules (300 mg total) by mouth 3 (three) times daily. 01/21/21   Prosperi, Christian H, PA-C  ?HYDROcodone-acetaminophen (NORCO/VICODIN) 5-325 MG tablet Take 1-2 tablets by mouth every 6 (six) hours as needed. 01/21/21   Prosperi, Christian H, PA-C  ?omeprazole (PRILOSEC) 20 MG capsule Take 1 capsule (20 mg total) by mouth daily. ?Patient not taking: Reported on 11/12/2020 09/14/20   Dietrich Pates, PA-C  ?oxyCODONE-acetaminophen (PERCOCET) 5-325 MG tablet Take 1  tablet by mouth every 6 (six) hours as needed for severe pain. ?Patient not taking: Reported on 11/12/2020 03/30/20   Molpus, Jonny Ruiz, MD  ?sucralfate (CARAFATE) 1 g tablet Take 1 tablet (1 g total) by mouth 4 (four) times daily -  with meals and at bedtime. ?Patient not taking: Reported on 11/12/2020 09/14/20   Dietrich Pates, PA-C  ?   ? ?Allergies    ?Latex and Penicillins   ? ?Review of Systems   ?Review of Systems  ?Constitutional:  Negative for diaphoresis, fatigue and fever.  ?HENT:  Negative for trouble swallowing.   ?Respiratory:  Negative for cough and shortness of breath.   ?Cardiovascular:  Positive for chest pain. Negative for palpitations, orthopnea, claudication, syncope, PND and near-syncope.  ?Gastrointestinal:  Negative for abdominal pain, anorexia, heartburn, nausea and vomiting.  ?Musculoskeletal:  Negative for back pain.  ?Neurological:  Negative for dizziness, weakness, numbness and headaches.  ? ?Physical Exam ?Updated Vital Signs ?BP (!) 144/89 (BP Location: Right Arm)   Pulse 78   Temp 98.2 ?F (36.8 ?C) (Oral)   Resp 20   Ht 5\' 2"  (1.575 m)   Wt 117.9 kg   SpO2 100%   BMI 47.55 kg/m?  ?Physical Exam ?Vitals and nursing note reviewed.  ?Constitutional:   ?   General: She is not in acute distress. ?   Appearance: She is well-developed.  ?HENT:  ?   Head: Normocephalic and atraumatic.  ?Eyes:  ?   Extraocular Movements: Extraocular movements intact.  ?   Conjunctiva/sclera: Conjunctivae normal.  ?  Pupils: Pupils are equal, round, and reactive to light.  ?Cardiovascular:  ?   Rate and Rhythm: Normal rate and regular rhythm.  ?   Pulses:     ?     Radial pulses are 2+ on the right side and 2+ on the left side.  ?   Heart sounds: No murmur heard. ?Pulmonary:  ?   Effort: Pulmonary effort is normal. No respiratory distress.  ?   Breath sounds: Normal breath sounds.  ?Chest:  ?   Chest wall: Tenderness present.  ?Abdominal:  ?   Palpations: Abdomen is soft.  ?   Tenderness: There is no abdominal  tenderness.  ?Musculoskeletal:     ?   General: No swelling. Normal range of motion.  ?   Cervical back: Normal range of motion and neck supple.  ?   Right lower leg: No edema.  ?   Left lower leg: No edema.  ?Skin: ?   General: Skin is warm and dry.  ?   Capillary Refill: Capillary refill takes less than 2 seconds.  ?Neurological:  ?   Mental Status: She is alert.  ?Psychiatric:     ?   Mood and Affect: Mood normal.  ? ? ?ED Results / Procedures / Treatments   ?Labs ?(all labs ordered are listed, but only abnormal results are displayed) ?Labs Reviewed  ?CBC WITH DIFFERENTIAL/PLATELET - Abnormal; Notable for the following components:  ?    Result Value  ? Hemoglobin 10.7 (*)   ? HCT 32.1 (*)   ? All other components within normal limits  ?BASIC METABOLIC PANEL - Abnormal; Notable for the following components:  ? CO2 21 (*)   ? Calcium 8.6 (*)   ? All other components within normal limits  ?LIPASE, BLOOD  ?HEPATIC FUNCTION PANEL  ?TROPONIN I (HIGH SENSITIVITY)  ? ? ?EKG ?EKG Interpretation ? ?Date/Time:  Thursday April 18 2021 18:19:14 EDT ?Ventricular Rate:  75 ?PR Interval:  138 ?QRS Duration: 87 ?QT Interval:  438 ?QTC Calculation: 490 ?R Axis:   52 ?Text Interpretation: Sinus rhythm Confirmed by Virgina Norfolk [phone]) on 04/18/2021 6:20:57 PM ? ?Radiology ?DG Chest Portable 1 View ? ?Result Date: 04/18/2021 ?CLINICAL DATA:  Left rib pain 3 weeks EXAM: PORTABLE CHEST 1 VIEW COMPARISON:  Chest two-view 01/02/2018 FINDINGS: The heart size and mediastinal contours are within normal limits. Both lungs are clear. The visualized skeletal structures are unremarkable. IMPRESSION: No active disease. Electronically Signed   By: Marlan Palau M.D.   On: 04/18/2021 18:07   ? ?Procedures ?Procedures  ? ? ?Medications Ordered in ED ?Medications - No data to display ? ?ED Course/ Medical Decision Making/ A&P ?  ?                        ?Medical Decision Making ?Amount and/or Complexity of Data Reviewed ?Labs: ordered. ?Radiology:  ordered. ? ?Risk ?Prescription drug management. ? ? ?Sherri Reid is here with left-sided chest wall pain.  On and off for the last 3 weeks.  Worse with movement.  Normal vitals.  No fever.  PERC negative and doubt PE.  No cardiac risk factors.  Heart score 1.  Will check EKG, troponin, CBC, CMP, lipase, chest x-ray.  Differential diagnosis is likely musculoskeletal process but will evaluate for ACS, pneumonia, pancreatitis. ? ?Per my review and interpretation of labs there is no significant anemia, electrolyte abnormality, kidney injury, leukocytosis.  Troponin within normal limits.  Chest x-ray per  my review and interpretation shows no evidence of pneumonia, pneumothorax or other acute process.  Overall suspect muscular process.  Recommend Tylenol and ibuprofen.  Written for Flexeril.  Discharged in good condition. ? ?This chart was dictated using voice recognition software.  Despite best efforts to proofread,  errors can occur which can change the documentation meaning.  ? ? ? ? ? ? ? ?Final Clinical Impression(s) / ED Diagnoses ?Final diagnoses:  ?Nonspecific chest pain  ? ? ?Rx / DC Orders ?ED Discharge Orders   ? ?      Ordered  ?  cyclobenzaprine (FLEXERIL) 10 MG tablet  2 times daily PRN       ? 04/18/21 1849  ? ?  ?  ? ?  ? ? ?  ?Virgina NorfolkCuratolo, Jeanny Rymer, DO ?04/18/21 1850 ? ?

## 2021-06-11 ENCOUNTER — Emergency Department (HOSPITAL_BASED_OUTPATIENT_CLINIC_OR_DEPARTMENT_OTHER): Payer: Managed Care, Other (non HMO)

## 2021-06-11 ENCOUNTER — Emergency Department (HOSPITAL_BASED_OUTPATIENT_CLINIC_OR_DEPARTMENT_OTHER)
Admission: EM | Admit: 2021-06-11 | Discharge: 2021-06-11 | Disposition: A | Discharge status: Home / Self Care | Payer: Managed Care, Other (non HMO) | Attending: Emergency Medicine | Admitting: Emergency Medicine

## 2021-06-11 ENCOUNTER — Encounter (HOSPITAL_BASED_OUTPATIENT_CLINIC_OR_DEPARTMENT_OTHER): Payer: Self-pay | Admitting: Emergency Medicine

## 2021-06-11 ENCOUNTER — Other Ambulatory Visit: Payer: Self-pay

## 2021-06-11 DIAGNOSIS — R112 Nausea with vomiting, unspecified: Secondary | ICD-10-CM | POA: Diagnosis not present

## 2021-06-11 DIAGNOSIS — J45909 Unspecified asthma, uncomplicated: Secondary | ICD-10-CM | POA: Insufficient documentation

## 2021-06-11 DIAGNOSIS — R1030 Lower abdominal pain, unspecified: Secondary | ICD-10-CM | POA: Diagnosis not present

## 2021-06-11 DIAGNOSIS — I10 Essential (primary) hypertension: Secondary | ICD-10-CM | POA: Insufficient documentation

## 2021-06-11 DIAGNOSIS — Z91040 Latex allergy status: Secondary | ICD-10-CM | POA: Insufficient documentation

## 2021-06-11 DIAGNOSIS — D649 Anemia, unspecified: Secondary | ICD-10-CM | POA: Diagnosis not present

## 2021-06-11 DIAGNOSIS — R102 Pelvic and perineal pain: Secondary | ICD-10-CM

## 2021-06-11 LAB — URINALYSIS, ROUTINE W REFLEX MICROSCOPIC
Bilirubin Urine: NEGATIVE
Glucose, UA: NEGATIVE mg/dL
Hgb urine dipstick: NEGATIVE
Ketones, ur: NEGATIVE mg/dL
Leukocytes,Ua: NEGATIVE
Nitrite: NEGATIVE
Protein, ur: NEGATIVE mg/dL
Specific Gravity, Urine: 1.015 (ref 1.005–1.030)
pH: 6.5 (ref 5.0–8.0)

## 2021-06-11 LAB — CBC WITH DIFFERENTIAL/PLATELET
Abs Immature Granulocytes: 0.01 10*3/uL (ref 0.00–0.07)
Basophils Absolute: 0 10*3/uL (ref 0.0–0.1)
Basophils Relative: 0 %
Eosinophils Absolute: 0.1 10*3/uL (ref 0.0–0.5)
Eosinophils Relative: 1 %
HCT: 35 % — ABNORMAL LOW (ref 36.0–46.0)
Hemoglobin: 11.6 g/dL — ABNORMAL LOW (ref 12.0–15.0)
Immature Granulocytes: 0 %
Lymphocytes Relative: 37 %
Lymphs Abs: 2.6 10*3/uL (ref 0.7–4.0)
MCH: 26.7 pg (ref 26.0–34.0)
MCHC: 33.1 g/dL (ref 30.0–36.0)
MCV: 80.6 fL (ref 80.0–100.0)
Monocytes Absolute: 0.6 10*3/uL (ref 0.1–1.0)
Monocytes Relative: 8 %
Neutro Abs: 3.9 10*3/uL (ref 1.7–7.7)
Neutrophils Relative %: 54 %
Platelets: 324 10*3/uL (ref 150–400)
RBC: 4.34 MIL/uL (ref 3.87–5.11)
RDW: 13.3 % (ref 11.5–15.5)
WBC: 7.1 10*3/uL (ref 4.0–10.5)
nRBC: 0 % (ref 0.0–0.2)

## 2021-06-11 LAB — COMPREHENSIVE METABOLIC PANEL
ALT: 11 U/L (ref 0–44)
AST: 17 U/L (ref 15–41)
Albumin: 3.2 g/dL — ABNORMAL LOW (ref 3.5–5.0)
Alkaline Phosphatase: 52 U/L (ref 38–126)
Anion gap: 6 (ref 5–15)
BUN: 17 mg/dL (ref 6–20)
CO2: 23 mmol/L (ref 22–32)
Calcium: 8.5 mg/dL — ABNORMAL LOW (ref 8.9–10.3)
Chloride: 108 mmol/L (ref 98–111)
Creatinine, Ser: 0.82 mg/dL (ref 0.44–1.00)
GFR, Estimated: >60 mL/min (ref >60)
Glucose, Bld: 94 mg/dL (ref 70–99)
Potassium: 3.8 mmol/L (ref 3.5–5.1)
Sodium: 137 mmol/L (ref 135–145)
Total Bilirubin: 0.2 mg/dL — ABNORMAL LOW (ref 0.3–1.2)
Total Protein: 6.9 g/dL (ref 6.5–8.1)

## 2021-06-11 LAB — LIPASE, BLOOD: Lipase: 41 U/L (ref 11–51)

## 2021-06-11 LAB — WET PREP, GENITAL
Clue Cells Wet Prep HPF POC: NONE SEEN
Sperm: NONE SEEN
Trich, Wet Prep: NONE SEEN
WBC, Wet Prep HPF POC: <10 (ref <10)
Yeast Wet Prep HPF POC: NONE SEEN

## 2021-06-11 LAB — HCG, SERUM, QUALITATIVE: Preg, Serum: NEGATIVE

## 2021-06-11 MED ORDER — METRONIDAZOLE 500 MG PO TABS: 500.0000 mg | ORAL_TABLET | Freq: Two times a day (BID) | ORAL | 0 refills | Status: AC

## 2021-06-11 MED ORDER — CEFTRIAXONE SODIUM 500 MG IJ SOLR
500.0000 mg | Freq: Once | INTRAMUSCULAR | Status: AC
Start: 2021-06-11 — End: 2021-06-11
  Administered 2021-06-11: 500 mg via INTRAMUSCULAR
  Filled 2021-06-11: qty 500

## 2021-06-11 MED ORDER — ONDANSETRON 4 MG PO TBDP: 4.0000 mg | ORAL_TABLET | Freq: Three times a day (TID) | ORAL | 0 refills | Status: DC | PRN

## 2021-06-11 MED ORDER — IOHEXOL 300 MG/ML  SOLN
125.0000 mL | Freq: Once | INTRAMUSCULAR | Status: AC | PRN
  Administered 2021-06-11: 125 mL via INTRAVENOUS

## 2021-06-11 MED ORDER — LIDOCAINE HCL (PF) 1 % IJ SOLN
INTRAMUSCULAR | Status: AC
  Administered 2021-06-11: 1.2 mL
  Filled 2021-06-11: qty 5

## 2021-06-11 MED ORDER — DOXYCYCLINE HYCLATE 100 MG PO CAPS: 100.0000 mg | ORAL_CAPSULE | Freq: Two times a day (BID) | ORAL | 0 refills | Status: AC

## 2021-06-11 MED ORDER — ONDANSETRON HCL 4 MG/2ML IJ SOLN
4.0000 mg | Freq: Once | INTRAMUSCULAR | Status: AC
  Administered 2021-06-11: 4 mg via INTRAVENOUS
  Filled 2021-06-11: qty 2

## 2021-06-11 MED ORDER — LACTATED RINGERS IV BOLUS
1000.0000 mL | Freq: Once | INTRAVENOUS | Status: AC
  Administered 2021-06-11: 1000 mL via INTRAVENOUS

## 2021-06-11 NOTE — ED Triage Notes (Signed)
Pt is c/o lower abd pain x 1 week with vomiting for the past two days  Pt states has had diarrhea once or twice

## 2021-06-11 NOTE — ED Notes (Signed)
ED Provider at bedside. 

## 2021-06-11 NOTE — Discharge Instructions (Addendum)
You were seen today for lower abdominal/pelvic pain and had CT and [place] of your pelvis which did not show acute pathology. You have tenderness to your uterus on exam so we will cover you for possible pelvic infection/pelvic inflammatory disease however it is also likely this is a viral gastroenteritis. Please follow up with your physicians as an outpatient> Return for increasing pain, fever or other concerns.

## 2021-06-11 NOTE — ED Provider Notes (Signed)
[facility] EMERGENCY DEPARTMENT Provider Note   CSN: [id number] Arrival date & time: 06/11/21  0454     History  Chief Complaint  Patient presents with   Abdominal Pain    Sherri Reid is a 46 y.o. female.  The history is provided by the patient.  Abdominal Pain She has history of hypertension, anemia, asthma and comes in because of abdominal pain and vomiting.  She has had suprapubic pain for the last week.  Pain is nonradiating.  She has treated it with ibuprofen which has been giving him temporary relief.  She denies any urinary difficulty or vaginal discharge.  Last menses was May 15 and was normal for her.  She is a lesbian and denies heterosexual sex.  Over the last 24 hours, she has developed nausea and vomiting.  She has vomited 7 times.  There has been no blood or mucus in the emesis.  She has also had some mild diarrhea.  Pain is not affected by emesis or bowel movement.  She denies fever, chills, sweats.   Home Medications Prior to Admission medications   Medication Sig Start Date End Date Taking? Authorizing Provider  clindamycin (CLEOCIN) 150 MG capsule Take 2 capsules (300 mg total) by mouth 3 (three) times daily. 01/21/21   Prosperi, Christian H, PA-C  cyclobenzaprine (FLEXERIL) 10 MG tablet Take 1 tablet (10 mg total) by mouth 2 (two) times daily as needed for muscle spasms. 04/18/21   Curatolo, Adam, DO  HYDROcodone-acetaminophen (NORCO/VICODIN) 5-325 MG tablet Take 1-2 tablets by mouth every 6 (six) hours as needed. 01/21/21   Prosperi, Christian H, PA-C  omeprazole (PRILOSEC) 20 MG capsule Take 1 capsule (20 mg total) by mouth daily. Patient not taking: Reported on 11/12/2020 09/14/20   Dietrich Pates, PA-C  oxyCODONE-acetaminophen (PERCOCET) 5-325 MG tablet Take 1 tablet by mouth every 6 (six) hours as needed for severe pain. Patient not taking: Reported on 11/12/2020 03/30/20   Molpus, Jonny Ruiz, MD  sucralfate (CARAFATE) 1 g tablet Take 1 tablet (1 g total) by mouth  4 (four) times daily -  with meals and at bedtime. Patient not taking: Reported on 11/12/2020 09/14/20   Dietrich Pates, PA-C      Allergies    Latex and Penicillins    Review of Systems   Review of Systems  Gastrointestinal:  Positive for abdominal pain.  All other systems reviewed and are negative.  Physical Exam Updated Vital Signs BP (!) 154/108 (BP Location: Right Arm)   Pulse 77   Temp 98.3 F (36.8 C) (Oral)   Resp 16   Ht 5\' 2"  (1.575 m)   Wt 122.5 kg   LMP 05/20/2021 (Approximate)   SpO2 95%   BMI 49.38 kg/m  Physical Exam Vitals and nursing note reviewed.  46 year old female, resting comfortably and in no acute distress. Vital signs are significant for elevated blood pressure. Oxygen saturation is 95%, which is normal. Head is normocephalic and atraumatic. PERRLA, EOMI. Oropharynx is clear. Neck is nontender and supple without adenopathy or JVD. Back is nontender and there is no CVA tenderness. Lungs are clear without rales, wheezes, or rhonchi. Chest is nontender. Heart has regular rate and rhythm without murmur. Abdomen is soft, flat, with mild to moderate suprapubic tenderness.  There is no rebound or guarding.  Peristalsis is hypoactive. Extremities have no cyanosis or edema, full range of motion is present. Skin is warm and dry without rash. Neurologic: Mental status is normal, cranial nerves are intact, moves  all extremities equally.  ED Results / Procedures / Treatments   Labs (all labs ordered are listed, but only abnormal results are displayed) Labs Reviewed  CBC WITH DIFFERENTIAL/PLATELET - Abnormal; Notable for the following components:      Result Value   Hemoglobin 11.6 (*)    HCT 35.0 (*)    All other components within normal limits  COMPREHENSIVE METABOLIC PANEL - Abnormal; Notable for the following components:   Calcium 8.5 (*)    Albumin 3.2 (*)    Total Bilirubin 0.2 (*)    All other components within normal limits  LIPASE, BLOOD  HCG,  SERUM, QUALITATIVE  URINALYSIS, ROUTINE W REFLEX MICROSCOPIC   Radiology No results found.  Procedures Procedures    Medications Ordered in ED Medications - No data to display  ED Course/ Medical Decision Making/ A&P                           Medical Decision Making Amount and/or Complexity of Data Reviewed Labs: ordered. Radiology: ordered.  Risk Prescription drug management.   Abdominal pain, nausea, vomiting.  It is not clear that the abdominal pain and vomiting are related.  Differential diagnosis is broad and includes, but is not limited to, diverticulitis, pelvic inflammatory disease, appendicitis, ovarian cyst, adenomyosis, viral gastritis.  More records are reviewed, and on 06/16/2018 she had lower abdominal pain associated with urinary symptoms.  I have ordered screening labs of CBC, and comprehensive metabolic panel normal, lipase, pregnancy test, urinalysis, and have ordered CT of abdomen and pelvis to look for serious intra-abdominal pathology such as appendicitis.  I have reviewed and interpreted all the laboratory tests, and there is a mild anemia present which is improved compared with baseline and otherwise unremarkable.  Urinalysis is still pending.  CT scan has been completed with the radiologist interpretation pending.  Case is signed out to Dr. Dalene Seltzer.  Final Clinical Impression(s) / ED Diagnoses Final diagnoses:  Suprapubic pain  Nausea and vomiting, unspecified vomiting type  Normochromic normocytic anemia    Rx / DC Orders ED Discharge Orders     None         Dione Booze, MD 06/11/21 [phone]

## 2021-06-11 NOTE — ED Provider Notes (Signed)
  Physical Exam  BP (!) 177/104   Pulse 71   Temp 98.3 F (36.8 C) (Oral)   Resp 16   Ht 5\' 2"  (1.575 m)   Wt 122.5 kg   LMP 05/20/2021 (Approximate)   SpO2 100%   BMI 49.38 kg/m   Physical Exam  Procedures  Procedures  ED Course / MDM     Received care of patient from Dr. 05/22/2021. See his note for prior hx, physical and care.   Briefly, this is a 46 yo who presents with concern for lower abdominal pain, nausea, vomiting, had 2 episodes of diarrhea.  CT abdomen pelvis evaluated by me without acute findings.    Pelvic exam completed with discharge and uterine tenderness present. Wet prep without acute findings.  Pelvic 54 completed showing no ovarian torsion or other acute abnormalities.  Given uterine tenderness and discharge on exam, do feel it is reasonable to treat for PID although discussed also likely symptoms related to gastroenteritis. Given rx for abx, zofran Patient discharged in stable condition with understanding of reasons to return.        [place], MD 06/11/21 2350

## 2021-06-12 LAB — GC/CHLAMYDIA PROBE AMP (~~LOC~~) NOT AT ARMC
Chlamydia: NEGATIVE
Comment: NEGATIVE
Comment: NORMAL
Neisseria Gonorrhea: NEGATIVE

## 2021-07-29 ENCOUNTER — Encounter (HOSPITAL_BASED_OUTPATIENT_CLINIC_OR_DEPARTMENT_OTHER): Payer: Self-pay | Admitting: Pharmacy Technician

## 2021-07-29 ENCOUNTER — Emergency Department (HOSPITAL_BASED_OUTPATIENT_CLINIC_OR_DEPARTMENT_OTHER)
Admission: EM | Admit: 2021-07-29 | Discharge: 2021-07-29 | Disposition: A | Discharge status: Home / Self Care | Payer: Managed Care, Other (non HMO) | Attending: Emergency Medicine | Admitting: Emergency Medicine

## 2021-07-29 ENCOUNTER — Other Ambulatory Visit: Payer: Self-pay

## 2021-07-29 DIAGNOSIS — J45909 Unspecified asthma, uncomplicated: Secondary | ICD-10-CM | POA: Diagnosis not present

## 2021-07-29 DIAGNOSIS — G43909 Migraine, unspecified, not intractable, without status migrainosus: Secondary | ICD-10-CM | POA: Diagnosis not present

## 2021-07-29 DIAGNOSIS — I10 Essential (primary) hypertension: Secondary | ICD-10-CM | POA: Diagnosis not present

## 2021-07-29 DIAGNOSIS — R519 Headache, unspecified: Secondary | ICD-10-CM | POA: Diagnosis present

## 2021-07-29 LAB — PREGNANCY, URINE: Preg Test, Ur: NEGATIVE

## 2021-07-29 MED ORDER — METOCLOPRAMIDE HCL 5 MG/ML IJ SOLN
10.0000 mg | Freq: Once | INTRAMUSCULAR | Status: AC
  Administered 2021-07-29: 10 mg via INTRAVENOUS
  Filled 2021-07-29: qty 2

## 2021-07-29 MED ORDER — ACETAMINOPHEN 500 MG PO TABS
1000.0000 mg | ORAL_TABLET | Freq: Once | ORAL | Status: AC
  Administered 2021-07-29: 1000 mg via ORAL
  Filled 2021-07-29: qty 2

## 2021-07-29 MED ORDER — SODIUM CHLORIDE 0.9 % IV SOLN
INTRAVENOUS | Status: DC
Start: 2021-07-29 — End: 2021-07-29

## 2021-07-29 MED ORDER — DIPHENHYDRAMINE HCL 50 MG/ML IJ SOLN
12.5000 mg | Freq: Once | INTRAMUSCULAR | Status: AC
  Administered 2021-07-29: 12.5 mg via INTRAVENOUS
  Filled 2021-07-29: qty 1

## 2021-07-29 MED ORDER — SODIUM CHLORIDE 0.9 % IV BOLUS
1000.0000 mL | Freq: Once | INTRAVENOUS | Status: AC
  Administered 2021-07-29: 1000 mL via INTRAVENOUS

## 2021-07-29 MED ORDER — PROCHLORPERAZINE EDISYLATE 10 MG/2ML IJ SOLN
10.0000 mg | Freq: Once | INTRAMUSCULAR | Status: AC
  Administered 2021-07-29: 10 mg via INTRAVENOUS
  Filled 2021-07-29: qty 2

## 2021-07-29 NOTE — ED Triage Notes (Signed)
Pt here with headache for the last week, worse on the L side. Pt has tried otc meds without relief. Also endorses nausea due to the pain.

## 2021-07-29 NOTE — ED Notes (Signed)
Patient Alert and oriented to baseline. Stable and ambulatory to baseline. Patient verbalized understanding of the discharge instructions.  Patient belongings were taken by the patient.   

## 2021-07-29 NOTE — ED Provider Notes (Signed)
  [facility] EM[facility] DEPARTMENT Provider Note   CSN: [id number] Arrival date & time: 07/29/21  3785     History {Add pertinent medical, surgical, social history, OB history to HPI:1} Chief Complaint  Patient presents with   Headache    Sherri Reid is a 46 y.o. female.   Headache      Home Medications Prior to Admission medications   Medication Sig Start Date End Date Taking? Authorizing Provider  cyclobenzaprine (FLEXERIL) 10 MG tablet Take 1 tablet (10 mg total) by mouth 2 (two) times daily as needed for muscle spasms. 04/18/21   Curatolo, Adam, DO  HYDROcodone-acetaminophen (NORCO/VICODIN) 5-325 MG tablet Take 1-2 tablets by mouth every 6 (six) hours as needed. 01/21/21   Prosperi, Christian H, PA-C  ondansetron (ZOFRAN-ODT) 4 MG disintegrating tablet Take 1 tablet (4 mg total) by mouth every 8 (eight) hours as needed for nausea or vomiting. 06/11/21   Alvira Monday, MD      Allergies    Latex and Penicillins    Review of Systems   Review of Systems  Neurological:  Positive for headaches.    Physical Exam Updated Vital Signs BP (!) 147/74   Pulse 79   Temp 98.6 F (37 C)   Resp 16   SpO2 94%  Physical Exam  ED Results / Procedures / Treatments   Labs (all labs ordered are listed, but only abnormal results are displayed) Labs Reviewed  PREGNANCY, URINE    EKG None  Radiology No results found.  Procedures Procedures  {Document cardiac monitor, telemetry assessment procedure when appropriate:1}  Medications Ordered in ED Medications  sodium chloride 0.9 % bolus 1,000 mL (has no administration in time range)    And  0.9 %  sodium chloride infusion (has no administration in time range)  metoCLOPramide (REGLAN) injection 10 mg (has no administration in time range)  diphenhydrAMINE (BENADRYL) injection 12.5 mg (has no administration in time range)  acetaminophen (TYLENOL) tablet 1,000 mg (has no administration in time range)    ED  Course/ Medical Decision Making/ A&P                           Medical Decision Making Amount and/or Complexity of Data Reviewed Labs: ordered.  Risk OTC drugs. Prescription drug management.   ***  {Document critical care time when appropriate:1} {Document review of labs and clinical decision tools ie heart score, Chads2Vasc2 etc:1}  {Document your independent review of radiology images, and any outside records:1} {Document your discussion with family members, caretakers, and with consultants:1} {Document social determinants of health affecting pt's care:1} {Document your decision making why or why not admission, treatments were needed:1} Final Clinical Impression(s) / ED Diagnoses Final diagnoses:  None    Rx / DC Orders ED Discharge Orders     None

## 2021-08-08 ENCOUNTER — Encounter: Payer: Self-pay | Admitting: Neurology

## 2021-10-01 ENCOUNTER — Other Ambulatory Visit: Payer: Self-pay

## 2021-10-01 ENCOUNTER — Emergency Department (HOSPITAL_BASED_OUTPATIENT_CLINIC_OR_DEPARTMENT_OTHER)
Admission: EM | Admit: 2021-10-01 | Discharge: 2021-10-01 | Disposition: A | Discharge status: Home / Self Care | Payer: Managed Care, Other (non HMO) | Attending: Emergency Medicine | Admitting: Emergency Medicine

## 2021-10-01 ENCOUNTER — Encounter (HOSPITAL_BASED_OUTPATIENT_CLINIC_OR_DEPARTMENT_OTHER): Payer: Self-pay | Admitting: Emergency Medicine

## 2021-10-01 DIAGNOSIS — Z91040 Latex allergy status: Secondary | ICD-10-CM | POA: Diagnosis not present

## 2021-10-01 DIAGNOSIS — S0003XA Contusion of scalp, initial encounter: Secondary | ICD-10-CM | POA: Diagnosis not present

## 2021-10-01 DIAGNOSIS — S0990XA Unspecified injury of head, initial encounter: Secondary | ICD-10-CM | POA: Diagnosis present

## 2021-10-01 DIAGNOSIS — W01198A Fall on same level from slipping, tripping and stumbling with subsequent striking against other object, initial encounter: Secondary | ICD-10-CM | POA: Insufficient documentation

## 2021-10-01 NOTE — Discharge Instructions (Signed)
You have been seen today for your complaint of scalp contusion. Home care instructions are as follows:  You should rest.  You may alternate Tylenol and ibuprofen at home every 4 hours.  You may take up to 800 mg of ibuprofen and up to 1000 mg of Tylenol. Follow up with: Your primary care provider.  You may establish care with any primary care provider you would like. Please seek immediate medical care if you develop any of the following symptoms: You have very bad pain or a headache, and medicine does not help. You are very tired or confused. Your personality changes. You vomit. You have a nosebleed that does not stop. You see two of everything (double vision) or have blurry vision. You have clear fluid coming from your nose or ear, and it does not go away. You have problems walking or using your arms or legs. You feel very dizzy. At this time there does not appear to be the presence of an emergent medical condition, however there is always the potential for conditions to change. Please read and follow the below instructions.  Do not take your medicine if  develop an itchy rash, swelling in your mouth or lips, or difficulty breathing; call 911 and seek immediate emergency medical attention if this occurs.  You may review your lab tests and imaging results in their entirety on your MyChart account.  Please discuss all results of fully with your primary care provider and other specialist at your follow-up visit.  Note: Portions of this text may have been transcribed using voice recognition software. Every effort was made to ensure accuracy; however, inadvertent computerized transcription errors may still be present.

## 2021-10-01 NOTE — ED Triage Notes (Signed)
Pt fell on Sunday and hit head (top of forehead) on either window or door; no LOC, but c/o continued pain and "knot"

## 2021-10-01 NOTE — ED Notes (Signed)
Pt reports having a fall yesterday , tripping and hitting her forehead, denies LOC. Denies nausea or vomiting . Reports slight pain and a lump. Patient is A/O x4. No eccymosis noted under the eyes , no blood or fluid noted from ears. Patient is in no acute distress.

## 2021-10-01 NOTE — ED Provider Notes (Signed)
[facility] Provider Note   CSN: [id number] Arrival date & time: 10/01/21  1927     History  Chief Complaint  Patient presents with   Head Injury    [facility] is a 46 y.o. female.  Who presents ED for evaluation of scalp pain.  She states she was walking around her house Sunday when she tripped and fell forward into a door.  She hit the top of her head on the door.  Did not lose consciousness.  Does not take blood thinners.  Did not vomit afterwards.  No retrograde amnesia.  No change in mental status.  Patient currently complaining of scalp pain that has remained unchanged since the incident.  States she has been taking ibuprofen 800 mg every 4 hours for the pain with moderate relief.  Has also been icing the affected area.  States the ice has reduced the swelling.  No numbness, weakness or tingling.  No headaches.  No vision changes.  No neck pain.   Head Injury      Home Medications Prior to Admission medications   Medication Sig Start Date End Date Taking? Authorizing Provider  cyclobenzaprine (FLEXERIL) 10 MG tablet Take 1 tablet (10 mg total) by mouth 2 (two) times daily as needed for muscle spasms. 04/18/21   Curatolo, Adam, DO  HYDROcodone-acetaminophen (NORCO/VICODIN) 5-325 MG tablet Take 1-2 tablets by mouth every 6 (six) hours as needed. 01/21/21   Prosperi, Christian H, PA-C  ondansetron (ZOFRAN-ODT) 4 MG disintegrating tablet Take 1 tablet (4 mg total) by mouth every 8 (eight) hours as needed for nausea or vomiting. 06/11/21   Gareth Morgan, MD      Allergies    Latex and Penicillins    Review of Systems   Review of Systems  Musculoskeletal:  Positive for myalgias.  All other systems reviewed and are negative.   Physical Exam Updated Vital Signs BP (!) 162/97   Pulse 77   Temp 98.4 F (36.9 C) (Oral)   Resp 16   Ht 5\' 2"  (1.575 m)   Wt 117.9 kg   LMP 09/11/2021   SpO2 98%   BMI 47.55 kg/m  Physical Exam Vitals and  nursing note reviewed.  Constitutional:      General: She is not in acute distress.    Appearance: Normal appearance. She is normal weight. She is not ill-appearing.  HENT:     Head: Normocephalic and atraumatic.     Mouth/Throat:     Mouth: Mucous membranes are moist.     Pharynx: Oropharynx is clear. No oropharyngeal exudate or posterior oropharyngeal erythema.  Eyes:     Extraocular Movements: Extraocular movements intact.     Pupils: Pupils are equal, round, and reactive to light.     Comments: No nystagmus  Neck:     Comments: No C-spine tenderness Cardiovascular:     Rate and Rhythm: Normal rate and regular rhythm.     Pulses: Normal pulses.  Pulmonary:     Effort: Pulmonary effort is normal. No respiratory distress.  Abdominal:     General: Abdomen is flat.  Musculoskeletal:        General: Swelling (Small area of swelling on the scalp) present. Normal range of motion.     Cervical back: Normal range of motion and neck supple. No tenderness.  Skin:    General: Skin is warm and dry.     Capillary Refill: Capillary refill takes less than 2 seconds.  Neurological:  General: No focal deficit present.     Mental Status: She is alert and oriented to person, place, and time.     Comments: No facial droop, slurred speech, unilateral or global weakness, pronator drift.  Heel-to-shin normal.  Sensation normal.  Psychiatric:        Mood and Affect: Mood normal.        Behavior: Behavior normal.     ED Results / Procedures / Treatments   Labs (all labs ordered are listed, but only abnormal results are displayed) Labs Reviewed - No data to display  EKG None  Radiology No results found.  Procedures Procedures    Medications Ordered in ED Medications - No data to display  ED Course/ Medical Decision Making/ A&P                           Medical Decision Making This patient presents to the ED for concern of scalp contusion.  The differential diagnosis includes  scalp contusion, concussion, traumatic brain injury   Additional history obtained from: Nursing notes from this visit.  Afebrile, hemodynamically stable.  Patient is a 46 year old female who presents ED for evaluation of scalp contusion.  Currently complaining of superficial localized pain with no other symptoms.  Neurological exam normal.  No retrograde amnesia, vomiting, vision changes, headaches.  No need for imaging.  History and physical exam correlate most closely with scalp contusion. patient was instructed on appropriate dosing of Tylenol and ibuprofen.  Informed that she should continue icing the affected area until symptoms resolve.  Given strict return precautions.  Stable at discharge.   At this time there does not appear to be any evidence of an acute emergency medical condition and the patient appears stable for discharge with appropriate outpatient follow up. Diagnosis was discussed with patient who verbalizes understanding of care plan and is agreeable to discharge. I have discussed return precautions with patient who verbalizes understanding. Patient encouraged to follow-up with their PCP within 1 week. All questions answered.  Note: Portions of this report may have been transcribed using voice recognition software. Every effort was made to ensure accuracy; however, inadvertent computerized transcription errors may still be present.          Final Clinical Impression(s) / ED Diagnoses Final diagnoses:  Contusion of scalp, initial encounter    Rx / DC Orders ED Discharge Orders     None         Mora Bellman 10/01/21 2209    Maia Plan, MD 10/07/21 1204

## 2021-10-15 NOTE — Progress Notes (Deleted)
Initial neurology clinic note  [facility] MRN: [id number] DOB: 16-Jan-1975  Referring provider: Ernie Avena, MD  Primary care provider: Pcp, No  Reason for consult:  Headache  Subjective:  This is [name], a 46 y.o. ***-handed female with a medical history of asthma, HTN, and migraine*** who presents to neurology clinic with headache. The patient is accompanied by ***.  ***  Patient was seen at the ED on 07/29/21 for left sided headache with photophobia and phonophobia. It was a similar headache to prior headaches. Patient received a headache cocktail and was discharged with referral to neurology for further evaluation.  Of note, patient was also seen in the ED on 10/01/21 for scalp contusion after tripping and hitting head on floor.   MEDICATIONS:  Outpatient Encounter Medications as of 10/17/2021  Medication Sig   cyclobenzaprine (FLEXERIL) 10 MG tablet Take 1 tablet (10 mg total) by mouth 2 (two) times daily as needed for muscle spasms.   HYDROcodone-acetaminophen (NORCO/VICODIN) 5-325 MG tablet Take 1-2 tablets by mouth every 6 (six) hours as needed.   ondansetron (ZOFRAN-ODT) 4 MG disintegrating tablet Take 1 tablet (4 mg total) by mouth every 8 (eight) hours as needed for nausea or vomiting.   No facility-administered encounter medications on file as of 10/17/2021.    PAST MEDICAL HISTORY: Past Medical History:  Diagnosis Date   Asthma    History of syphilis    treated   Hypertension    Vaginal Pap smear, abnormal    f/u ok    PAST SURGICAL HISTORY: Past Surgical History:  Procedure Laterality Date   BREAST BIOPSY     FINGER SURGERY     NO PAST SURGERIES      ALLERGIES: Allergies  Allergen Reactions   Latex Swelling   Penicillins Hives, Swelling and Other (See Comments)    Has patient had a PCN reaction causing immediate rash, facial/tongue/throat swelling, SOB or lightheadedness with hypotension: Yes Has patient had a PCN reaction  causing severe rash involving mucus membranes or skin necrosis: No Has patient had a PCN reaction that required hospitalization: No Has patient had a PCN reaction occurring within the last 10 years: No If all of the above answers are "NO", then may proceed with Cephalosporin use.     FAMILY HISTORY: Family History  Problem Relation Age of Onset   Hyperlipidemia Mother    Mental illness Mother    Hypertension Mother    Diabetes Father    Cancer Maternal Aunt    Cancer Paternal Uncle    Anesthesia problems Neg Hx    Breast cancer Neg Hx     SOCIAL HISTORY: Social History   Tobacco Use   Smoking status: Former    Packs/day: 1.00    Years: 22.00    Total pack years: 22.00    Types: Cigarettes   Smokeless tobacco: Never  Vaping Use   Vaping Use: Every day   Substances: Nicotine, Flavoring  Substance Use Topics   Alcohol use: Not Currently    Comment: occ   Drug use: No   Social History   Social History Narrative   Not on file    Objective:  Vital Signs:  LMP 09/11/2021   ***  Labs and Imaging review: Internal labs: No results found for: "HGBA1C" No results found for: "VITAMINB12" No results found for: "TSH" No results found for: "ESRSEDRATE", "POCTSEDRATE"  Normal or unremarkable: CBC, CMP  External labs: ***  Imaging: ***  Assessment/Plan:  [facility] is  a 46 y.o. female who presents for evaluation of ***. *** has a relevant medical history of ***. *** neurological examination is pertinent for ***. Available diagnostic data is significant for ***. This constellation of symptoms and objective data would most likely localize to ***. ***  PLAN: -Blood work: *** ***  -Return to clinic ***  The impression above as well as the plan as outlined below were extensively discussed with the patient (in the company of ***) who voiced understanding. All questions were answered to their satisfaction.  The patient was counseled on pertinent fall  precautions per the printed material provided today, and as noted under the "Patient Instructions" section below.***  When available, results of the above investigations and possible further recommendations will be communicated to the patient via telephone/MyChart. Patient to call office if not contacted after expected testing turnaround time.   Total time spent reviewing records, interview, history/exam, documentation, and coordination of care on day of encounter:  *** min   Thank you for allowing me to participate in patient's care.  If I can answer any additional questions, I would be pleased to do so.  Kai Levins, MD   CC: Pcp, No No address on file  CC: Referring provider: Regan Lemming, MD [address] [place],  [place] [id number]

## 2021-10-17 ENCOUNTER — Ambulatory Visit: Payer: Self-pay | Admitting: Neurology

## 2021-12-09 ENCOUNTER — Other Ambulatory Visit: Payer: Self-pay

## 2021-12-14 ENCOUNTER — Other Ambulatory Visit: Payer: Self-pay

## 2021-12-14 ENCOUNTER — Emergency Department (HOSPITAL_COMMUNITY)
Admission: EM | Admit: 2021-12-14 | Discharge: 2021-12-14 | Disposition: A | Discharge status: Home / Self Care | Payer: Self-pay | Attending: Emergency Medicine | Admitting: Emergency Medicine

## 2021-12-14 DIAGNOSIS — R7989 Other specified abnormal findings of blood chemistry: Secondary | ICD-10-CM | POA: Insufficient documentation

## 2021-12-14 DIAGNOSIS — Z91040 Latex allergy status: Secondary | ICD-10-CM | POA: Insufficient documentation

## 2021-12-14 DIAGNOSIS — Z23 Encounter for immunization: Secondary | ICD-10-CM | POA: Insufficient documentation

## 2021-12-14 DIAGNOSIS — L03116 Cellulitis of left lower limb: Secondary | ICD-10-CM

## 2021-12-14 LAB — BASIC METABOLIC PANEL
Anion gap: 14 (ref 5–15)
BUN: 9 mg/dL (ref 6–20)
CO2: 20 mmol/L — ABNORMAL LOW (ref 22–32)
Calcium: 8.4 mg/dL — ABNORMAL LOW (ref 8.9–10.3)
Chloride: 107 mmol/L (ref 98–111)
Creatinine, Ser: 1.15 mg/dL — ABNORMAL HIGH (ref 0.44–1.00)
GFR, Estimated: 59 mL/min — ABNORMAL LOW (ref >60)
Glucose, Bld: 111 mg/dL — ABNORMAL HIGH (ref 70–99)
Potassium: 3.4 mmol/L — ABNORMAL LOW (ref 3.5–5.1)
Sodium: 141 mmol/L (ref 135–145)

## 2021-12-14 LAB — LACTIC ACID, PLASMA: Lactic Acid, Venous: 1.9 mmol/L (ref 0.5–1.9)

## 2021-12-14 LAB — CBC WITH DIFFERENTIAL/PLATELET
Abs Immature Granulocytes: 0.02 10*3/uL (ref 0.00–0.07)
Basophils Absolute: 0 10*3/uL (ref 0.0–0.1)
Basophils Relative: 1 %
Eosinophils Absolute: 0 10*3/uL (ref 0.0–0.5)
Eosinophils Relative: 1 %
HCT: 33.7 % — ABNORMAL LOW (ref 36.0–46.0)
Hemoglobin: 10.7 g/dL — ABNORMAL LOW (ref 12.0–15.0)
Immature Granulocytes: 0 %
Lymphocytes Relative: 36 %
Lymphs Abs: 2.4 10*3/uL (ref 0.7–4.0)
MCH: 26.2 pg (ref 26.0–34.0)
MCHC: 31.8 g/dL (ref 30.0–36.0)
MCV: 82.4 fL (ref 80.0–100.0)
Monocytes Absolute: 0.4 10*3/uL (ref 0.1–1.0)
Monocytes Relative: 6 %
Neutro Abs: 3.7 10*3/uL (ref 1.7–7.7)
Neutrophils Relative %: 56 %
Platelets: 376 10*3/uL (ref 150–400)
RBC: 4.09 MIL/uL (ref 3.87–5.11)
RDW: 13.3 % (ref 11.5–15.5)
WBC: 6.6 10*3/uL (ref 4.0–10.5)
nRBC: 0 % (ref 0.0–0.2)

## 2021-12-14 MED ORDER — TETANUS-DIPHTH-ACELL PERTUSSIS 5-2.5-18.5 LF-MCG/0.5 IM SUSY
0.5000 mL | PREFILLED_SYRINGE | Freq: Once | INTRAMUSCULAR | Status: AC
  Administered 2021-12-14: 0.5 mL via INTRAMUSCULAR
  Filled 2021-12-14: qty 0.5

## 2021-12-14 MED ORDER — DOXYCYCLINE HYCLATE 100 MG PO CAPS: 100.0000 mg | ORAL_CAPSULE | Freq: Two times a day (BID) | ORAL | 0 refills | Status: AC

## 2021-12-14 NOTE — ED Triage Notes (Signed)
Patient reports worsening wound infection with drainage and swelling at left lower leg , denies fever or chills .

## 2021-12-14 NOTE — ED Provider Notes (Signed)
[facility] EMERGENCY DEPARTMENT Provider Note   CSN: [id number] Arrival date & time: 12/14/21  0550     History  Chief Complaint  Patient presents with   Wound Infection ( Left lower leg )     [facility] is a 46 y.o. female who presents to the ED with concerns for left lower leg infection onset 11/20/2021.  Notes that prior to the onset of her symptoms she hit her leg on a piece of equipment.  Since then she has noted redness, a scab, drainage from the area.  Has tried ice, elevating her leg for her symptoms without relief.  Denies chest pain, shortness of breath, nausea, vomiting, fever.  Has a allergy to penicillin. Pt denies recent travel, immobilization, surgery, estrogen use, birth control use, or PMHx of PE/DVT.   The history is provided by the patient. No language interpreter was used.       Home Medications Prior to Admission medications   Medication Sig Start Date End Date Taking? Authorizing Provider  doxycycline (VIBRAMYCIN) 100 MG capsule Take 1 capsule (100 mg total) by mouth 2 (two) times daily for 5 days. 12/14/21 12/19/21 Yes Zanayah Shadowens A, PA-C  cyclobenzaprine (FLEXERIL) 10 MG tablet Take 1 tablet (10 mg total) by mouth 2 (two) times daily as needed for muscle spasms. 04/18/21   Curatolo, Adam, DO  HYDROcodone-acetaminophen (NORCO/VICODIN) 5-325 MG tablet Take 1-2 tablets by mouth every 6 (six) hours as needed. 01/21/21   Prosperi, Christian H, PA-C  ondansetron (ZOFRAN-ODT) 4 MG disintegrating tablet Take 1 tablet (4 mg total) by mouth every 8 (eight) hours as needed for nausea or vomiting. 06/11/21   Alvira Monday, MD      Allergies    Latex and Penicillins    Review of Systems   Review of Systems  All other systems reviewed and are negative.   Physical Exam Updated Vital Signs BP 127/68 (BP Location: Right Arm)   Pulse (!) 105   Temp 98 F (36.7 C)   Resp 20   SpO2 95%  Physical Exam Vitals and nursing note reviewed.   Constitutional:      General: She is not in acute distress.    Appearance: She is not diaphoretic.  HENT:     Head: Normocephalic and atraumatic.     Mouth/Throat:     Pharynx: No oropharyngeal exudate.  Eyes:     General: No scleral icterus.    Conjunctiva/sclera: Conjunctivae normal.  Cardiovascular:     Rate and Rhythm: Normal rate and regular rhythm.     Pulses: Normal pulses.     Heart sounds: Normal heart sounds.  Pulmonary:     Effort: Pulmonary effort is normal. No respiratory distress.     Breath sounds: Normal breath sounds. No wheezing.  Abdominal:     General: Bowel sounds are normal.     Palpations: Abdomen is soft. There is no mass.     Tenderness: There is no abdominal tenderness. There is no guarding or rebound.  Musculoskeletal:        General: Normal range of motion.     Cervical back: Normal range of motion and neck supple.     Comments: Erythema and increased warmth noted to left lower extremity shin.  Scab noted to the area.  No appreciable drainage noted to the area.  Pedal pulses intact.  No tenderness to palpation noted to left ankle, foot, knee.  See picture below.  Skin:    General: Skin is  warm and dry.  Neurological:     Mental Status: She is alert.  Psychiatric:        Behavior: Behavior normal.     ED Results / Procedures / Treatments   Labs (all labs ordered are listed, but only abnormal results are displayed) Labs Reviewed  BASIC METABOLIC PANEL - Abnormal; Notable for the following components:      Result Value   Potassium 3.4 (*)    CO2 20 (*)    Glucose, Bld 111 (*)    Creatinine, Ser 1.15 (*)    Calcium 8.4 (*)    GFR, Estimated 59 (*)    All other components within normal limits  CBC WITH DIFFERENTIAL/PLATELET - Abnormal; Notable for the following components:   Hemoglobin 10.7 (*)    HCT 33.7 (*)    All other components within normal limits  CULTURE, BLOOD (ROUTINE X 2)  CULTURE, BLOOD (ROUTINE X 2)  LACTIC ACID, PLASMA     EKG None  Radiology No results found.  Procedures Procedures    Medications Ordered in ED Medications  Tdap (BOOSTRIX) injection 0.5 mL (0.5 mLs Intramuscular Given 12/14/21 0836)    ED Course/ Medical Decision Making/ A&P Clinical Course as of 12/14/21 1037  Sat Dec 14, 2021  1032 Discussed with patient lab findings.  Offered patient IV fluids at this time due to mild dehydration noted on labs, at this time, pt notes that she would like to go home and drink fluids at this time. I think that this reasonable given, lack of AKI at this time. Skin will be marked by RN. Discussed with patient discharge treatment plan. Answered all available questions. Pt appears safe for discharge at this time.  [SB]    Clinical Course User Index [SB] Carry Weesner A, PA-C                           Medical Decision Making Amount and/or Complexity of Data Reviewed Labs: ordered.  Risk Prescription drug management.   Pt presents with concerns for left lower leg infection onset 11/20/2021.  Notes that her symptoms started after she hit her leg on a piece of equipment.  Unsure of tetanus status. Vital signs, pt afebrile. On exam, pt with Erythema and increased warmth noted to left lower extremity shin.  Scab noted to the area.  No appreciable drainage noted to the area.  Pedal pulses intact.  No tenderness to palpation noted to left ankle, foot, knee.  No acute cardiovascular, respiratory exam findings. Differential diagnosis includes cellulitis, abscess, DVT.    Co morbidities that complicate the patient evaluation: HTN  Labs:  I ordered, and personally interpreted labs.  The pertinent results include:   CBC without leukocytosis BMP with slightly elevated creatinine, otherwise unremarkable Lactic at 1.9 Blood cultures ordered with results pending at time of discharge.  Medications:  I ordered medication including tetanus for vaccine prophylaxis I have reviewed the patients home medicines  and have made adjustments as needed   Disposition: Presentation suspicious for cellulitis, doubt abscess at this time. Doubt DVT at this time, pt without risk factors for DVT. After consideration of the diagnostic results and the patients response to treatment, I feel that the patient would benefit from Discharge home. Work note provided. Pt provided with Rx of doxycycline. Supportive care measures and strict return precautions discussed with patient at bedside. Pt acknowledges and verbalizes understanding. Pt appears safe for discharge. Follow up as indicated in  discharge paperwork.    This chart was dictated using voice recognition software, Dragon. Despite the best efforts of this provider to proofread and correct errors, errors may still occur which can change documentation meaning.   Final Clinical Impression(s) / ED Diagnoses Final diagnoses:  Cellulitis of left lower extremity    Rx / DC Orders ED Discharge Orders          Ordered    doxycycline (VIBRAMYCIN) 100 MG capsule  2 times daily        12/14/21 1034              Aleli Navedo A, PA-C 12/14/21 1038    Pricilla Loveless, MD 12/18/21 [phone]

## 2021-12-14 NOTE — Discharge Instructions (Addendum)
It was a pleasure taking care of you today!  Your labs were unremarkable today, it did show mild dehydration, ensure that you are continue with fluid intake at home. Your tetanus was updated in the ED today. You will be sent a prescription for Doxycycline, take as directed.  You may continue with elevating your lower extremity at night.  You may follow-up with your primary care provider as needed.  Return to the emergency department if you are experiencing increasing/worsening redness, swelling, pain, drainage, worsening symptoms.

## 2021-12-19 LAB — CULTURE, BLOOD (ROUTINE X 2): Culture: NO GROWTH

## 2022-04-21 ENCOUNTER — Encounter: Payer: Self-pay | Admitting: *Deleted

## 2022-06-28 ENCOUNTER — Encounter (HOSPITAL_BASED_OUTPATIENT_CLINIC_OR_DEPARTMENT_OTHER): Payer: Self-pay | Admitting: Emergency Medicine

## 2022-06-28 ENCOUNTER — Emergency Department (HOSPITAL_BASED_OUTPATIENT_CLINIC_OR_DEPARTMENT_OTHER)
Admission: EM | Admit: 2022-06-28 | Discharge: 2022-06-28 | Disposition: A | Discharge status: Home / Self Care | Payer: Self-pay | Attending: Emergency Medicine | Admitting: Emergency Medicine

## 2022-06-28 ENCOUNTER — Other Ambulatory Visit: Payer: Self-pay

## 2022-06-28 DIAGNOSIS — M62838 Other muscle spasm: Secondary | ICD-10-CM | POA: Insufficient documentation

## 2022-06-28 DIAGNOSIS — E669 Obesity, unspecified: Secondary | ICD-10-CM | POA: Insufficient documentation

## 2022-06-28 DIAGNOSIS — J45909 Unspecified asthma, uncomplicated: Secondary | ICD-10-CM | POA: Insufficient documentation

## 2022-06-28 DIAGNOSIS — I10 Essential (primary) hypertension: Secondary | ICD-10-CM | POA: Insufficient documentation

## 2022-06-28 DIAGNOSIS — Z6841 Body Mass Index (BMI) 40.0 and over, adult: Secondary | ICD-10-CM | POA: Insufficient documentation

## 2022-06-28 DIAGNOSIS — M545 Low back pain, unspecified: Secondary | ICD-10-CM | POA: Insufficient documentation

## 2022-06-28 LAB — URINALYSIS, ROUTINE W REFLEX MICROSCOPIC
Bilirubin Urine: NEGATIVE
Glucose, UA: NEGATIVE mg/dL
Hgb urine dipstick: NEGATIVE
Ketones, ur: NEGATIVE mg/dL
Leukocytes,Ua: NEGATIVE
Nitrite: NEGATIVE
Protein, ur: NEGATIVE mg/dL
Specific Gravity, Urine: 1.025 (ref 1.005–1.030)
pH: 6.5 (ref 5.0–8.0)

## 2022-06-28 LAB — PREGNANCY, URINE: Preg Test, Ur: NEGATIVE

## 2022-06-28 MED ORDER — IBUPROFEN 600 MG PO TABS: 600.0000 mg | ORAL_TABLET | Freq: Four times a day (QID) | ORAL | 0 refills | Status: DC | PRN

## 2022-06-28 MED ORDER — ACETAMINOPHEN 500 MG PO TABS
1000.0000 mg | ORAL_TABLET | Freq: Once | ORAL | Status: AC
  Administered 2022-06-28: 1000 mg via ORAL
  Filled 2022-06-28: qty 2

## 2022-06-28 MED ORDER — KETOROLAC TROMETHAMINE 60 MG/2ML IM SOLN
30.0000 mg | Freq: Once | INTRAMUSCULAR | Status: AC
  Administered 2022-06-28: 30 mg via INTRAMUSCULAR
  Filled 2022-06-28: qty 2

## 2022-06-28 MED ORDER — OXYCODONE HCL 5 MG PO TABS
5.0000 mg | ORAL_TABLET | Freq: Once | ORAL | Status: AC
  Administered 2022-06-28: 5 mg via ORAL
  Filled 2022-06-28: qty 1

## 2022-06-28 MED ORDER — CYCLOBENZAPRINE HCL 10 MG PO TABS: 10.0000 mg | ORAL_TABLET | Freq: Two times a day (BID) | ORAL | 0 refills | Status: DC | PRN

## 2022-06-28 MED ORDER — OXYCODONE HCL 5 MG PO TABS: 5.0000 mg | ORAL_TABLET | Freq: Four times a day (QID) | ORAL | 0 refills | Status: DC | PRN

## 2022-06-28 MED ORDER — ACETAMINOPHEN 325 MG PO TABS: 650.0000 mg | ORAL_TABLET | Freq: Four times a day (QID) | ORAL | 0 refills | Status: DC | PRN

## 2022-06-28 MED ORDER — LIDOCAINE 5 % EX PTCH
1.0000 | MEDICATED_PATCH | Freq: Once | CUTANEOUS | Status: DC
  Administered 2022-06-28: 1 via TRANSDERMAL
  Filled 2022-06-28: qty 1

## 2022-06-28 NOTE — ED Triage Notes (Addendum)
Right flank pain X 2 weeks. Not aware of injury, hurts worse with movement or deep breath.

## 2022-06-28 NOTE — Discharge Instructions (Signed)
Please follow-up with your Primary Care Physician within the next week. Please take your medications as instructed and discuss any changes to your medications with your primary care physician.    Please return to the Emergency Department if you have any leg numbness, leg weakness, difficulty walking, fevers, worsening of pain, lightheadedness, lose consciousness, severe abdominal pain, severe headache, difficulty urinating, or difficulty having a bowel movement.   Please return to the emergency department immediately for any new or concerning symptoms, or if you get worse.   

## 2022-06-28 NOTE — ED Provider Notes (Signed)
[facility] Provider Note  CSN: [id number] Arrival date & time: 06/28/22 8657  Chief Complaint(s) Flank Pain  HPI Sherri Reid is a 47 y.o. female with past medical history as below, significant for asthma, HTN, obesity who presents to the ED with complaint of right-sided flank/back pain.  Patient reports over the past 45 days she has experienced right-sided back/flank pain described as sharp, stabbing, worse with any type of torso movement or twisting, bending over or walking.  Denies any recent traumatic injuries or falls.  No fevers, chills, IV drug use, recent spinal surgery or injections, numbness or tingling to her legs or change in bowel or bladder function including incontinence or overflow.  No medications prior to arrival.  Denies any chest pain or dyspnea, nausea, vomiting or diarrhea BRBPR or melena  Past Medical History Past Medical History:  Diagnosis Date   Asthma    History of syphilis    treated   Hypertension    Vaginal Pap smear, abnormal    f/u ok   Patient Active Problem List   Diagnosis Date Noted   Non-intractable vomiting    AKI (acute kidney injury) (HCC) 01/03/2018   Chronic anemia 01/03/2018   Obesity, Class III, BMI 40-49.9 (morbid obesity) (HCC)    Leukocytosis    Cellulitis of breast 01/02/2018   Home Medication(s) Prior to Admission medications   Medication Sig Start Date End Date Taking? Authorizing Provider  acetaminophen (TYLENOL) 325 MG tablet Take 2 tablets (650 mg total) by mouth every 6 (six) hours as needed. 06/28/22  Yes Tanda Rockers A, DO  cyclobenzaprine (FLEXERIL) 10 MG tablet Take 1 tablet (10 mg total) by mouth 2 (two) times daily as needed for muscle spasms. 06/28/22  Yes Tanda Rockers A, DO  ibuprofen (ADVIL) 600 MG tablet Take 1 tablet (600 mg total) by mouth every 6 (six) hours as needed. 06/28/22  Yes Tanda Rockers A, DO  oxyCODONE (ROXICODONE) 5 MG immediate release tablet Take 1 tablet  (5 mg total) by mouth every 6 (six) hours as needed for severe pain. 06/28/22  Yes Tanda Rockers A, DO  ondansetron (ZOFRAN-ODT) 4 MG disintegrating tablet Take 1 tablet (4 mg total) by mouth every 8 (eight) hours as needed for nausea or vomiting. 06/11/21   Alvira Monday, MD                                                                                                                                    Past Surgical History Past Surgical History:  Procedure Laterality Date   BREAST BIOPSY     FINGER SURGERY     NO PAST SURGERIES     Family History Family History  Problem Relation Age of Onset   Hyperlipidemia Mother    Mental illness Mother    Hypertension Mother    Diabetes Father    Cancer Maternal Aunt    Cancer  Paternal Uncle    Anesthesia problems Neg Hx    Breast cancer Neg Hx     Social History Social History   Tobacco Use   Smoking status: Former    Packs/day: 1.00    Years: 22.00    Additional pack years: 0.00    Total pack years: 22.00    Types: Cigarettes   Smokeless tobacco: Never  Vaping Use   Vaping Use: Every day   Substances: Nicotine, Flavoring  Substance Use Topics   Alcohol use: Not Currently    Comment: occ   Drug use: No   Allergies Latex and Penicillins  Review of Systems Review of Systems  Constitutional:  Negative for activity change and fever.  HENT:  Negative for facial swelling and trouble swallowing.   Eyes:  Negative for discharge and redness.  Respiratory:  Negative for cough and shortness of breath.   Cardiovascular:  Negative for chest pain and palpitations.  Gastrointestinal:  Negative for abdominal pain and nausea.  Genitourinary:  Negative for dysuria and flank pain.  Musculoskeletal:  Positive for back pain. Negative for gait problem.  Skin:  Negative for pallor and rash.  Neurological:  Negative for syncope and headaches.    Physical Exam Vital Signs  I have reviewed the triage vital signs BP (!) 106/55   Pulse 76    Temp 98.7 F (37.1 C) (Oral)   Resp 16   Ht 5\' 2"  (1.575 m)   Wt 117.9 kg   LMP 06/07/2022 (Approximate)   SpO2 99%   BMI 47.55 kg/m  Physical Exam Vitals and nursing note reviewed.  Constitutional:      General: She is not in acute distress.    Appearance: Normal appearance. She is obese.  HENT:     Head: Normocephalic and atraumatic.     Right Ear: External ear normal.     Left Ear: External ear normal.     Nose: Nose normal.     Mouth/Throat:     Mouth: Mucous membranes are moist.  Eyes:     General: No scleral icterus.       Right eye: No discharge.        Left eye: No discharge.  Cardiovascular:     Rate and Rhythm: Normal rate and regular rhythm.     Pulses: Normal pulses.     Heart sounds: Normal heart sounds.  Pulmonary:     Effort: Pulmonary effort is normal. No respiratory distress.     Breath sounds: Normal breath sounds.  Abdominal:     General: Abdomen is flat.     Tenderness: There is no abdominal tenderness. There is no guarding or rebound.     Comments: No abdominal pain  Musculoskeletal:       Arms:     Cervical back: No rigidity.     Right lower leg: No edema.     Left lower leg: No edema.     Comments: Tense muscle belly No midline spinous process tenderness to palpation or percussion, no crepitance or step-off   Skin:    General: Skin is warm and dry.     Capillary Refill: Capillary refill takes less than 2 seconds.  Neurological:     Mental Status: She is alert and oriented to person, place, and time.     GCS: GCS eye subscore is 4. GCS verbal subscore is 5. GCS motor subscore is 6.     Cranial Nerves: No dysarthria.     Motor: No weakness.  Gait: Gait is intact.  Psychiatric:        Mood and Affect: Mood normal.        Behavior: Behavior normal.     ED Results and Treatments Labs (all labs ordered are listed, but only abnormal results are displayed) Labs Reviewed  URINALYSIS, ROUTINE W REFLEX MICROSCOPIC  PREGNANCY, URINE                                                                                                                           Radiology No results found.  Pertinent labs & imaging results that were available during my care of the patient were reviewed by me and considered in my medical decision making (see MDM for details).  Medications Ordered in ED Medications  lidocaine (LIDODERM) 5 % 1 patch (1 patch Transdermal Patch Applied 06/28/22 0724)  ketorolac (TORADOL) injection 30 mg (30 mg Intramuscular Given 06/28/22 0724)  oxyCODONE (Oxy IR/ROXICODONE) immediate release tablet 5 mg (5 mg Oral Given 06/28/22 0723)  acetaminophen (TYLENOL) tablet 1,000 mg (1,000 mg Oral Given 06/28/22 5366)                                                                                                                                     Procedures Procedures  (including critical care time)  Medical Decision Making / ED Course    Medical Decision Making:    Harlem Thresher is a 47 y.o. female with past medical history as below, significant for asthma, HTN, obesity who presents to the ED with complaint of right-sided flank/back pain.. The complaint involves an extensive differential diagnosis and also carries with it a high risk of complications and morbidity.  Serious etiology was considered. Ddx includes but is not limited to: Differential diagnosis includes but is not exclusive to musculoskeletal back pain, renal colic, urinary tract infection, pyelonephritis, intra-abdominal causes of back pain, aortic aneurysm or dissection, cauda equina syndrome, sciatica, lumbar disc disease, thoracic disc disease, etc.   Complete initial physical exam performed, notably the patient  was sitting upright in chair, no acute distress.    Reviewed and confirmed nursing documentation for past medical history, family history, social history.  Vital signs reviewed.    Clinical Course as of 06/28/22 0920  Sat Jun 28, 2022  4403  Feeling better on recheck, pain now 4/10, on arrival was 8-9/10. Gait steady. No neuro deficits.  [SG]  Clinical Course User Index [SG] Sloan Leiter, DO   Patient here with mid/low back pain.  For the past few days.  No neurologic changes.  No fevers.  No IV drug use or spinal manipulation or surgery or injections.  No midline TTP.  Will trial analgesia and recheck.  Will get UA and urine preg  Greatly improved on recheck  Patient presents with low back pain without signs of spinal cord compression, cauda equina syndrome, infection, aneurysm, or other serious etiology. The patient is neurologically intact. Given the extremely low risk of these diagnoses further testing and evaluation for these possibilities does not appear to be indicated at this time. Detailed discussions were had with the patient and/or family and caregivers, regarding current findings, and need for close f/u with PCP or on call doctor. The patient has been instructed to return immediately if the symptoms worsen in any way. Patient verbalized understanding and is in agreement with current care plan. All questions answered prior to discharge.      Additional history obtained: -Additional history obtained from na -External records from outside source obtained and reviewed including: Chart review including previous notes, labs, imaging, consultation notes including prior ED visits, prior labs and imaging   Lab Tests: -I ordered, reviewed, and interpreted labs.   The pertinent results include:   Labs Reviewed  URINALYSIS, ROUTINE W REFLEX MICROSCOPIC  PREGNANCY, URINE    Notable for ua/preg neg  EKG   EKG Interpretation  Date/Time:    Ventricular Rate:    PR Interval:    QRS Duration:   QT Interval:    QTC Calculation:   R Axis:     Text Interpretation:           Imaging Studies ordered: na   Medicines ordered and prescription drug management: Meds ordered this encounter  Medications   ketorolac  (TORADOL) injection 30 mg   oxyCODONE (Oxy IR/ROXICODONE) immediate release tablet 5 mg   acetaminophen (TYLENOL) tablet 1,000 mg   lidocaine (LIDODERM) 5 % 1 patch   cyclobenzaprine (FLEXERIL) 10 MG tablet    Sig: Take 1 tablet (10 mg total) by mouth 2 (two) times daily as needed for muscle spasms.    Dispense:  20 tablet    Refill:  0   oxyCODONE (ROXICODONE) 5 MG immediate release tablet    Sig: Take 1 tablet (5 mg total) by mouth every 6 (six) hours as needed for severe pain.    Dispense:  5 tablet    Refill:  0   acetaminophen (TYLENOL) 325 MG tablet    Sig: Take 2 tablets (650 mg total) by mouth every 6 (six) hours as needed.    Dispense:  36 tablet    Refill:  0   ibuprofen (ADVIL) 600 MG tablet    Sig: Take 1 tablet (600 mg total) by mouth every 6 (six) hours as needed.    Dispense:  30 tablet    Refill:  0    -I have reviewed the patients home medicines and have made adjustments as needed   Consultations Obtained: na   Cardiac Monitoring: Continuous pulse oximetry 99 to 100% on room air  Social Determinants of Health:  Diagnosis or treatment significantly limited by social determinants of health: obesity, no PCP   Reevaluation: After the interventions noted above, I reevaluated the patient and found that they have improved  Co morbidities that complicate the patient evaluation  Past Medical History:  Diagnosis Date   Asthma  History of syphilis    treated   Hypertension    Vaginal Pap smear, abnormal    f/u ok      Dispostion: Disposition decision including need for hospitalization was considered, and patient discharged from emergency department.    Final Clinical Impression(s) / ED Diagnoses Final diagnoses:  Muscle spasm  Acute right-sided low back pain without sciatica     This chart was dictated using voice recognition software.  Despite best efforts to proofread,  errors can occur which can change the documentation meaning.    Sloan Leiter, DO 06/28/22 [phone]

## 2022-12-01 ENCOUNTER — Emergency Department (HOSPITAL_BASED_OUTPATIENT_CLINIC_OR_DEPARTMENT_OTHER): Payer: Self-pay

## 2022-12-01 ENCOUNTER — Encounter (HOSPITAL_BASED_OUTPATIENT_CLINIC_OR_DEPARTMENT_OTHER): Payer: Self-pay

## 2022-12-01 ENCOUNTER — Emergency Department (HOSPITAL_BASED_OUTPATIENT_CLINIC_OR_DEPARTMENT_OTHER)
Admission: EM | Admit: 2022-12-01 | Discharge: 2022-12-01 | Disposition: A | Discharge status: Home / Self Care | Payer: Self-pay | Attending: Emergency Medicine | Admitting: Emergency Medicine

## 2022-12-01 ENCOUNTER — Other Ambulatory Visit: Payer: Self-pay

## 2022-12-01 DIAGNOSIS — Z91040 Latex allergy status: Secondary | ICD-10-CM | POA: Insufficient documentation

## 2022-12-01 DIAGNOSIS — M25562 Pain in left knee: Secondary | ICD-10-CM

## 2022-12-01 DIAGNOSIS — J45909 Unspecified asthma, uncomplicated: Secondary | ICD-10-CM | POA: Insufficient documentation

## 2022-12-01 MED ORDER — NAPROXEN 500 MG PO TABS: 500.0000 mg | ORAL_TABLET | Freq: Two times a day (BID) | ORAL | 0 refills | Status: DC

## 2022-12-01 MED ORDER — PREDNISONE 20 MG PO TABS: 40.0000 mg | ORAL_TABLET | Freq: Every day | ORAL | 0 refills | Status: DC

## 2022-12-01 MED ORDER — KETOROLAC TROMETHAMINE 15 MG/ML IJ SOLN
15.0000 mg | Freq: Once | INTRAMUSCULAR | Status: AC
  Administered 2022-12-01: 15 mg via INTRAMUSCULAR
  Filled 2022-12-01: qty 1

## 2022-12-01 NOTE — ED Triage Notes (Signed)
Pt reports left knee pain x 1 week, denies recent injury. She reports pain is worse with any movement. She is able to walk on it but is very painful.

## 2022-12-01 NOTE — ED Provider Notes (Signed)
[facility] Provider Note   CSN: [id number] Arrival date & time: 12/01/22  1826     History  Chief Complaint  Patient presents with   Knee Pain    Sherri Reid is a 47 y.o. female with past medical history of obesity, asthma coming into emergency room 1 week of left knee pain.  Patient reports she thinks that she stepped weird and tweaked her left knee.  Denies any recent trauma.  Patient reports pain over medial and lateral joint line.  Patient is able to initiate movement and walk.  She reports her knee hurts when she is bearing weight on it.  Denies any recent fever, chills, shortness of breath, chest pain.  Denies any calf pain, swelling, ankle pain or foot pain.   Knee Pain      Home Medications Prior to Admission medications   Medication Sig Start Date End Date Taking? Authorizing Provider  acetaminophen (TYLENOL) 325 MG tablet Take 2 tablets (650 mg total) by mouth every 6 (six) hours as needed. 06/28/22   Sloan Leiter, DO  cyclobenzaprine (FLEXERIL) 10 MG tablet Take 1 tablet (10 mg total) by mouth 2 (two) times daily as needed for muscle spasms. 06/28/22   Sloan Leiter, DO  ibuprofen (ADVIL) 600 MG tablet Take 1 tablet (600 mg total) by mouth every 6 (six) hours as needed. 06/28/22   Sloan Leiter, DO  ondansetron (ZOFRAN-ODT) 4 MG disintegrating tablet Take 1 tablet (4 mg total) by mouth every 8 (eight) hours as needed for nausea or vomiting. 06/11/21   Alvira Monday, MD  oxyCODONE (ROXICODONE) 5 MG immediate release tablet Take 1 tablet (5 mg total) by mouth every 6 (six) hours as needed for severe pain. 06/28/22   Sloan Leiter, DO      Allergies    Latex and Penicillins    Review of Systems   Review of Systems  Musculoskeletal:  Positive for arthralgias.    Physical Exam Updated Vital Signs BP (!) 196/135 (BP Location: Right Arm)   Pulse 86   Temp 97.8 F (36.6 C)   Resp 18   Ht 5\' 2"  (1.575 m)   Wt  135.2 kg   LMP 11/03/2022 (Exact Date)   SpO2 100%   BMI 54.50 kg/m  Physical Exam Vitals and nursing note reviewed.  Constitutional:      General: She is not in acute distress.    Appearance: She is not toxic-appearing.  HENT:     Head: Normocephalic and atraumatic.  Eyes:     General: No scleral icterus.    Conjunctiva/sclera: Conjunctivae normal.  Cardiovascular:     Rate and Rhythm: Normal rate and regular rhythm.     Pulses: Normal pulses.     Heart sounds: Normal heart sounds.  Pulmonary:     Effort: Pulmonary effort is normal. No respiratory distress.     Breath sounds: Normal breath sounds.  Abdominal:     General: Abdomen is flat. Bowel sounds are normal.     Palpations: Abdomen is soft.     Tenderness: There is no abdominal tenderness.  Musculoskeletal:        General: Tenderness present.     Right lower leg: No edema.     Left lower leg: No edema.     Comments: No obvious joint effusion on exam.  Patient has tenderness of patient over the lateral medial joint line.  No patellar apprehension.  No patellar tendon tenderness.  Patient neurovascularly intact.  No significant lower extremity edema noted on exam  Skin:    General: Skin is warm and dry.     Findings: No lesion.  Neurological:     General: No focal deficit present.     Mental Status: She is alert and oriented to person, place, and time. Mental status is at baseline.     ED Results / Procedures / Treatments   Labs (all labs ordered are listed, but only abnormal results are displayed) Labs Reviewed - No data to display  EKG None  Radiology DG Knee Complete 4 Views Left  Result Date: 12/01/2022 CLINICAL DATA:  Left knee pain for 2 weeks without known injury EXAM: LEFT KNEE - COMPLETE 4+ VIEW COMPARISON:  None Available. FINDINGS: No evidence of fracture, dislocation, or joint effusion. No evidence of arthropathy or other focal bone abnormality. Soft tissues are unremarkable. IMPRESSION: Negative.  Electronically Signed   By: Lupita Raider M.D.   On: 12/01/2022 20:38    Procedures Procedures    Medications Ordered in ED Medications - No data to display  ED Course/ Medical Decision Making/ A&P                                 Medical Decision Making Amount and/or Complexity of Data Reviewed Radiology: ordered.  Risk Prescription drug management.   This patient presents to the ED for concern of knee pain, this involves an extensive number of treatment options, and is a complaint that carries with it a high risk of complications and morbidity.  The differential diagnosis includes septic joint, arthritis, sprain, strain, patellar tendinitis, meniscus tear   Co morbidities that complicate the patient evaluation  Denies     Imaging Studies ordered:  I ordered imaging studies including left x-ray of knee I independently visualized and interpreted imaging which showed no acute abnormality I agree with the radiologist interpretation   Problem List / ED Course / Critical interventions / Medication management  Patient reporting to emergency room with left knee pain.  Patient reports he thinks this happened at work while walking.  But denies any obvious trauma.  Patient reports she has been able to ambulate but it is painful.  She has not tried anything for symptoms.  Never had any prior surgery on this leg.  On physical exam patient does not have any signs of infection no laceration over area no joint effusion.  Patient has good strength neurovascularly intact.  Imaging without abnormality.  At this time I do not feel any further imaging is necessary however I will provide follow-up information with orthopedics to ensure patient does not need MRI, trial of over-the-counter management and crutches. I ordered medication including Toradol  Reevaluation of the patient after these medicines showed that the patient stayed the same I have reviewed the patients home medicines and have  made adjustments as needed   Plan  F/u w/ PCP in 2-3d to ensure resolution of sx.  Patient was given return precautions. Patient stable for discharge at this time.  Patient educated on sx/dx and verbalized understanding of plan. Return to ER w/ new or worsening sx.          Final Clinical Impression(s) / ED Diagnoses Final diagnoses:  Acute pain of left knee    Rx / DC Orders ED Discharge Orders     None         Sherri Reid, Asher Muir  N, PA-C 12/01/22 2058    Virgina Norfolk, DO 12/01/22 2231

## 2022-12-01 NOTE — Discharge Instructions (Addendum)
You were seen in the emergency room today for left knee pain.  I recommend alternating Tylenol and naproxen over-the-counter for pain control.  I have sent steroids to your pharmacy that you can take for 5 days.  Had like you to follow-up with [facility] to ensure resolution of symptoms versus needing an MRI for further treatment.  Please return to emergency room with any new or worsening symptoms

## 2023-02-09 ENCOUNTER — Other Ambulatory Visit: Payer: Self-pay

## 2023-02-09 ENCOUNTER — Encounter (HOSPITAL_BASED_OUTPATIENT_CLINIC_OR_DEPARTMENT_OTHER): Payer: Self-pay | Admitting: Urology

## 2023-02-09 ENCOUNTER — Emergency Department (HOSPITAL_BASED_OUTPATIENT_CLINIC_OR_DEPARTMENT_OTHER)
Admission: EM | Admit: 2023-02-09 | Discharge: 2023-02-09 | Disposition: A | Discharge status: Home / Self Care | Payer: Self-pay | Attending: Emergency Medicine | Admitting: Emergency Medicine

## 2023-02-09 DIAGNOSIS — K029 Dental caries, unspecified: Secondary | ICD-10-CM | POA: Insufficient documentation

## 2023-02-09 DIAGNOSIS — K0889 Other specified disorders of teeth and supporting structures: Secondary | ICD-10-CM

## 2023-02-09 DIAGNOSIS — Z91040 Latex allergy status: Secondary | ICD-10-CM | POA: Insufficient documentation

## 2023-02-09 MED ORDER — IBUPROFEN 600 MG PO TABS: 600.0000 mg | ORAL_TABLET | Freq: Four times a day (QID) | ORAL | 0 refills | Status: AC | PRN

## 2023-02-09 MED ORDER — CLINDAMYCIN HCL 150 MG PO CAPS: 300.0000 mg | ORAL_CAPSULE | Freq: Four times a day (QID) | ORAL | 0 refills | Status: AC

## 2023-02-09 NOTE — Discharge Instructions (Addendum)
Please read and follow all provided instructions.  Your diagnoses today include:  1. Pain, dental    The exam and treatment you received today has been provided on an emergency basis only. This is not a substitute for complete medical or dental care.  Tests performed today include: Vital signs. See below for your results today.   Medications prescribed:  Clindamycin - antibiotic  You have been prescribed an antibiotic medicine: take the entire course of medicine even if you are feeling better. Stopping early can cause the antibiotic not to work.  Ibuprofen (Motrin, Advil) - anti-inflammatory pain medication Do not exceed 600mg  ibuprofen every 6 hours, take with food  You have been prescribed an anti-inflammatory medication or NSAID. Take with food. Take smallest effective dose for the shortest duration needed for your pain. Stop taking if you experience stomach pain or vomiting.   Take any prescribed medications only as directed.  Home care instructions:  Follow any educational materials contained in this packet.  Follow-up instructions: Please follow-up with your dentist for further evaluation of your symptoms.   Dental Assistance: See attached dental referral and/or resource guide.   Return instructions:  Please return to the Emergency Department if you experience worsening symptoms. Please return if you develop a fever, you develop more swelling in your face or neck, you have trouble breathing or swallowing food. Please return if you have any other emergent concerns.  Additional Information:  Your vital signs today were: BP (!) 179/89 (BP Location: Left Arm)   Pulse 84   Temp 97.8 F (36.6 C)   Resp 18   Ht 5\' 2"  (1.575 m)   Wt 135.2 kg   SpO2 99%   BMI 54.52 kg/m  If your blood pressure (BP) was elevated above 135/85 this visit, please have this repeated by your doctor within one month. --------------

## 2023-02-09 NOTE — ED Triage Notes (Signed)
Pt states right upper dental pain x 3 days  Pain with chewing  Denies fever

## 2023-02-09 NOTE — ED Provider Notes (Cosign Needed)
[place] [facility] AT [facility] Provider Note   CSN: [id number] Arrival date & time: 02/09/23  1440     History  Chief Complaint  Patient presents with   Dental Pain    Sherri Reid is a 48 y.o. female.  Patient presents to the department today for evaluation of dental pain.  Symptoms started about 3 days ago.  Patient reports having a chip and a tooth in the upper right jaw about a year ago.  She has not had pain from this tooth before.  She has had mild facial swelling.  No difficulty breathing or swallowing.  She has taken ibuprofen with temporary relief.  Does not currently have dental insurance to go see a dentist.  No fever.       Home Medications Prior to Admission medications   Medication Sig Start Date End Date Taking? Authorizing Provider  clindamycin (CLEOCIN) 150 MG capsule Take 2 capsules (300 mg total) by mouth every 6 (six) hours. 02/09/23  Yes Renne Crigler, PA-C  ibuprofen (ADVIL) 600 MG tablet Take 1 tablet (600 mg total) by mouth every 6 (six) hours as needed. 02/09/23  Yes Renne Crigler, PA-C      Allergies    Latex and Penicillins    Review of Systems   Review of Systems  Physical Exam Updated Vital Signs BP (!) 179/89 (BP Location: Left Arm)   Pulse 84   Temp 97.8 F (36.6 C)   Resp 18   Ht 5\' 2"  (1.575 m)   Wt 135.2 kg   SpO2 99%   BMI 54.52 kg/m  Physical Exam Vitals and nursing note reviewed.  Constitutional:      Appearance: She is well-developed.  HENT:     Head: Normocephalic and atraumatic.     Jaw: No trismus.     Right Ear: Tympanic membrane, ear canal and external ear normal.     Left Ear: Tympanic membrane, ear canal and external ear normal.     Nose: Nose normal.     Mouth/Throat:     Dentition: Abnormal dentition. Dental caries present. No dental abscesses.     Pharynx: Uvula midline. No uvula swelling.     Tonsils: No tonsillar abscesses.     Comments: Patient with pain and gingival swelling at  the bases of teeth #1 and 2.  No palpable abscess.  Area is very tender to palpation. Eyes:     Conjunctiva/sclera: Conjunctivae normal.  Neck:     Comments: No neck swelling or Ludwig's angina Musculoskeletal:     Cervical back: Normal range of motion and neck supple.  Lymphadenopathy:     Cervical: No cervical adenopathy.  Skin:    General: Skin is warm and dry.  Neurological:     Mental Status: She is alert.     ED Results / Procedures / Treatments   Labs (all labs ordered are listed, but only abnormal results are displayed) Labs Reviewed - No data to display  EKG None  Radiology No results found.  Procedures Procedures    Medications Ordered in ED Medications - No data to display  ED Course/ Medical Decision Making/ A&P    Patient seen and examined. History obtained directly from patient.   Labs/EKG: None ordered.  Imaging: None ordered.  Medications/Fluids: None ordered  Most recent vital signs reviewed and are as follows: BP (!) 179/89 (BP Location: Left Arm)   Pulse 84   Temp 97.8 F (36.6 C)   Resp 18  Ht 5\' 2"  (1.575 m)   Wt 135.2 kg   SpO2 99%   BMI 54.52 kg/m   Initial impression: dental pain/dental infection  Plan: Discharge to home.   Prescriptions written for: Clindamycin (penicillin allergy), ibuprofen 600 mg  Other home care instructions discussed: Avoidance of chewing or other activities that makes the symptoms worse. Eat soft foods if needed and maintain good hydration.   ED return instructions discussed: Encouraged patient to return with worsening facial or neck swelling, difficulty breathing or swallowing, fever.   Follow-up instructions discussed: Patient encouraged to follow-up with provided dental referral, resources -- or their own dentist if able.                                 Medical Decision Making Risk Prescription drug management.   Patient presents for dental pain. They do not have a fever and do not appear  septic. Exam unconcerning for Ludwig's angina or other deep tissue infection in neck and I do not feel that advanced imaging is indicated at this time. Low suspicion for PTA, RPA, epiglottis based on exam.   Patient will be treated for dental infection with antibiotic. Encouraged tylenol/NSAIDs as prescribed or as directed on the packaging for pain. Encouraged follow-up with a dentist for definitive and long-term management.          Final Clinical Impression(s) / ED Diagnoses Final diagnoses:  Pain, dental    Rx / DC Orders ED Discharge Orders          Ordered    clindamycin (CLEOCIN) 150 MG capsule  Every 6 hours        02/09/23 1519    ibuprofen (ADVIL) 600 MG tablet  Every 6 hours PRN        02/09/23 1519              Renne Crigler, PA-C 02/09/23 1522

## 2023-11-15 ENCOUNTER — Other Ambulatory Visit: Payer: Self-pay

## 2023-11-15 ENCOUNTER — Encounter (HOSPITAL_COMMUNITY): Payer: Self-pay

## 2023-11-15 ENCOUNTER — Emergency Department (HOSPITAL_COMMUNITY): Payer: Self-pay

## 2023-11-15 ENCOUNTER — Emergency Department (HOSPITAL_COMMUNITY)
Admission: EM | Admit: 2023-11-15 | Discharge: 2023-11-15 | Disposition: A | Discharge status: Home / Self Care | Payer: Self-pay | Attending: Emergency Medicine | Admitting: Emergency Medicine

## 2023-11-15 DIAGNOSIS — F10920 Alcohol use, unspecified with intoxication, uncomplicated: Secondary | ICD-10-CM

## 2023-11-15 DIAGNOSIS — Z91040 Latex allergy status: Secondary | ICD-10-CM | POA: Insufficient documentation

## 2023-11-15 DIAGNOSIS — R112 Nausea with vomiting, unspecified: Secondary | ICD-10-CM | POA: Insufficient documentation

## 2023-11-15 DIAGNOSIS — R7989 Other specified abnormal findings of blood chemistry: Secondary | ICD-10-CM | POA: Insufficient documentation

## 2023-11-15 DIAGNOSIS — F10120 Alcohol abuse with intoxication, uncomplicated: Secondary | ICD-10-CM | POA: Insufficient documentation

## 2023-11-15 LAB — CBC WITH DIFFERENTIAL/PLATELET
Abs Immature Granulocytes: 0.02 K/uL (ref 0.00–0.07)
Basophils Absolute: 0 K/uL (ref 0.0–0.1)
Basophils Relative: 0 %
Eosinophils Absolute: 0.1 K/uL (ref 0.0–0.5)
Eosinophils Relative: 1 %
HCT: 35.9 % — ABNORMAL LOW (ref 36.0–46.0)
Hemoglobin: 10.8 g/dL — ABNORMAL LOW (ref 12.0–15.0)
Immature Granulocytes: 0 %
Lymphocytes Relative: 36 %
Lymphs Abs: 3.3 K/uL (ref 0.7–4.0)
MCH: 24.4 pg — ABNORMAL LOW (ref 26.0–34.0)
MCHC: 30.1 g/dL (ref 30.0–36.0)
MCV: 81 fL (ref 80.0–100.0)
Monocytes Absolute: 0.5 K/uL (ref 0.1–1.0)
Monocytes Relative: 6 %
Neutro Abs: 5.2 K/uL (ref 1.7–7.7)
Neutrophils Relative %: 57 %
Platelets: 371 K/uL (ref 150–400)
RBC: 4.43 MIL/uL (ref 3.87–5.11)
RDW: 14.5 % (ref 11.5–15.5)
WBC: 9.1 K/uL (ref 4.0–10.5)
nRBC: 0 % (ref 0.0–0.2)

## 2023-11-15 LAB — I-STAT CHEM 8, ED
BUN: 12 mg/dL (ref 6–20)
Calcium, Ion: 1.01 mmol/L — ABNORMAL LOW (ref 1.15–1.40)
Chloride: 107 mmol/L (ref 98–111)
Creatinine, Ser: 1.3 mg/dL — ABNORMAL HIGH (ref 0.44–1.00)
Glucose, Bld: 127 mg/dL — ABNORMAL HIGH (ref 70–99)
HCT: 35 % — ABNORMAL LOW (ref 36.0–46.0)
Hemoglobin: 11.9 g/dL — ABNORMAL LOW (ref 12.0–15.0)
Potassium: 3.9 mmol/L (ref 3.5–5.1)
Sodium: 140 mmol/L (ref 135–145)
TCO2: 21 mmol/L — ABNORMAL LOW (ref 22–32)

## 2023-11-15 MED ORDER — ONDANSETRON HCL 4 MG/2ML IJ SOLN
4.0000 mg | Freq: Once | INTRAMUSCULAR | Status: AC
  Administered 2023-11-15: 4 mg via INTRAVENOUS
  Filled 2023-11-15: qty 2
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
## 2023-11-15 NOTE — ED Provider Notes (Signed)
 Attempted to record EDP who refused consent patient advised no recording per policy. Patient refused to stop.  EDP stated I was not consenting.     Ayauna Mcnay, MD 11/15/23 [phone]

## 2023-11-15 NOTE — ED Notes (Signed)
 Pt ambulated to bathroom and back to urinate. Tolerated well.

## 2023-11-15 NOTE — ED Notes (Signed)
 Pt educated on alcohol intoxication and nausea/vomiting. Pt and family verbalized understanding.

## 2023-11-15 NOTE — ED Notes (Signed)
 PT in CT.

## 2023-11-15 NOTE — ED Notes (Signed)
 Pt back from CT
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
